# Patient Record
Sex: Male | Born: 1955
Health system: Southern US, Community
[De-identification: ages and names within clinical notes are randomized; demographics above are authoritative.]

## PROBLEM LIST (undated history)

## (undated) DIAGNOSIS — F419 Anxiety disorder, unspecified: Secondary | ICD-10-CM

## (undated) DIAGNOSIS — T8859XA Other complications of anesthesia, initial encounter: Secondary | ICD-10-CM

## (undated) DIAGNOSIS — R011 Cardiac murmur, unspecified: Secondary | ICD-10-CM

## (undated) DIAGNOSIS — Z87442 Personal history of urinary calculi: Secondary | ICD-10-CM

## (undated) DIAGNOSIS — M545 Low back pain, unspecified: Secondary | ICD-10-CM

## (undated) DIAGNOSIS — G8929 Other chronic pain: Secondary | ICD-10-CM

## (undated) DIAGNOSIS — R3915 Urgency of urination: Secondary | ICD-10-CM

## (undated) DIAGNOSIS — M1990 Unspecified osteoarthritis, unspecified site: Secondary | ICD-10-CM

## (undated) DIAGNOSIS — J449 Chronic obstructive pulmonary disease, unspecified: Secondary | ICD-10-CM

## (undated) DIAGNOSIS — J45909 Unspecified asthma, uncomplicated: Secondary | ICD-10-CM

## (undated) DIAGNOSIS — R9431 Abnormal electrocardiogram [ECG] [EKG]: Secondary | ICD-10-CM

## (undated) DIAGNOSIS — E039 Hypothyroidism, unspecified: Secondary | ICD-10-CM

## (undated) DIAGNOSIS — R351 Nocturia: Secondary | ICD-10-CM

## (undated) DIAGNOSIS — N210 Calculus in bladder: Secondary | ICD-10-CM

## (undated) DIAGNOSIS — M79605 Pain in left leg: Secondary | ICD-10-CM

## (undated) DIAGNOSIS — R05 Cough: Secondary | ICD-10-CM

## (undated) DIAGNOSIS — Z905 Acquired absence of kidney: Secondary | ICD-10-CM

## (undated) DIAGNOSIS — R058 Other specified cough: Secondary | ICD-10-CM

## (undated) DIAGNOSIS — E782 Mixed hyperlipidemia: Secondary | ICD-10-CM

## (undated) DIAGNOSIS — E559 Vitamin D deficiency, unspecified: Secondary | ICD-10-CM

## (undated) DIAGNOSIS — R0600 Dyspnea, unspecified: Secondary | ICD-10-CM

## (undated) DIAGNOSIS — D494 Neoplasm of unspecified behavior of bladder: Secondary | ICD-10-CM

## (undated) DIAGNOSIS — J410 Simple chronic bronchitis: Secondary | ICD-10-CM

## (undated) DIAGNOSIS — J189 Pneumonia, unspecified organism: Secondary | ICD-10-CM

## (undated) DIAGNOSIS — D649 Anemia, unspecified: Secondary | ICD-10-CM

## (undated) DIAGNOSIS — C679 Malignant neoplasm of bladder, unspecified: Secondary | ICD-10-CM

## (undated) HISTORY — PX: TOOTH EXTRACTION: SUR596

## (undated) HISTORY — DX: Other chronic pain: G89.29

## (undated) HISTORY — DX: Vitamin D deficiency, unspecified: E55.9

## (undated) HISTORY — DX: Abnormal electrocardiogram (ECG) (EKG): R94.31

## (undated) HISTORY — DX: Anxiety disorder, unspecified: F41.9

## (undated) HISTORY — DX: Hypothyroidism, unspecified: E03.9

## (undated) HISTORY — DX: Mixed hyperlipidemia: E78.2

---

## 2002-03-28 ENCOUNTER — Encounter: Payer: Self-pay | Admitting: Emergency Medicine

## 2002-03-28 ENCOUNTER — Emergency Department (HOSPITAL_COMMUNITY): Admission: EM | Admit: 2002-03-28 | Discharge: 2002-03-29 | Payer: Self-pay | Admitting: Emergency Medicine

## 2002-06-27 ENCOUNTER — Emergency Department (HOSPITAL_COMMUNITY): Admission: EM | Admit: 2002-06-27 | Discharge: 2002-06-28 | Payer: Self-pay | Admitting: Emergency Medicine

## 2005-09-20 ENCOUNTER — Ambulatory Visit (HOSPITAL_COMMUNITY): Admission: RE | Admit: 2005-09-20 | Discharge: 2005-09-21 | Payer: Self-pay | Admitting: Neurosurgery

## 2005-09-20 HISTORY — PX: LUMBAR DISC SURGERY: SHX700

## 2009-06-30 ENCOUNTER — Encounter: Admission: RE | Admit: 2009-06-30 | Discharge: 2009-06-30 | Payer: Self-pay | Admitting: Family Medicine

## 2010-09-30 NOTE — Op Note (Signed)
NAME:  Santor, Ondre                 ACCOUNT NO.:  [id number]   MEDICAL RECORD NO.:  [id number]          PATIENT TYPE:  AMB   LOCATION:  SDS                          FACILITY:  [facility]   PHYSICIAN:  Coletta Memos, M.D.     DATE OF BIRTH:  Sep 24, 1955   DATE OF PROCEDURE:  09/20/2005  DATE OF DISCHARGE:                                 OPERATIVE REPORT   PREOPERATIVE DIAGNOSES:  1.  Far lateral herniated disk, L2-3 left.  2.  Foraminal stenosis L3-4 and at L4-5, lumbar radiculopathy, lumbar      spondylosis.   POSTOPERATIVE DIAGNOSES:  1.  Far lateral herniated disk, L2-3 left.  2.  Foraminal stenosis L3-4 and at L4-5, lumbar radiculopathy, lumbar      spondylosis.   PROCEDURES:  1.  Far lateral diskectomy with microdissection and left L2-3.  2.  Foraminotomy to decompress the L3 nerve roots, L4 nerve roots and L5      nerve roots with microdissection.   COMPLICATIONS:  None.   SURGEON:  Coletta Memos, M.D.   ASSISTANT:  Danae Orleans. Venetia Maxon, M.D.   ANESTHESIA:  General endotracheal.   INDICATIONS:  Mr. Rideaux is a gentleman who has weakness in his left lower  extremity.  He had a herniated disk at L2-3 on the left side in a far  lateral position.  He also has lateral recess stenosis at L3-4 and L4-5.  I  recommended after he stated he could not continue as he was, that he undergo  operative decompression.  He is admitted today for the decompression and  diskectomy.   OPERATIVE NOTE:  Mr. Freida Busman was brought to the operating room intubated,  placed under general anesthetic without difficulty.  He was rolled prone  onto a Wilson frame and all pressure points were properly padded.  His back  was prepped and he was draped in a sterile fashion.  I infiltrated 30 mL  0.5% lidocaine 1:200,000 epinephrine into the lumbar region.  I opened the  skin with #10 blade and took this down to the thoracolumbar fascia.  I then  exposed the lamina of L2 and L3, placed a double ended ganglion knife and  was able identify the L2 lamina.  I then exposed the L4 lamina.  I then also  exposed the L1 lamina so that I could reach the pars interarticularis of L2.  I did this using monopolar cautery and a subperiosteal technique.  I then  drilled out the lateral fifth of the L2 pars in order to remove the  ligamentum flavum.  I did this with a Kerrison punch and microdissection.  I  then was able to identified the left L2 nerve root and the left L2 pedicle.  I retracted the nerve root superiorly and was able to identify the disk  space.  I coagulated the disk space and then opened that with a #15 blade.  I was able to then removed disk material from the lateral portion of the  space and using a blunt ended probe I was then able to remove what was a  large  fragment of disk.  This coincided very nicely with the MRI imaging.  I  then irrigated the wound.  I then placed some Gelfoam in a patty over that  area and then turned my attention to L3-4.  I removed the retractors down  two spaces to the L3-4 interlaminar space.  I then performed a  semihemilaminectomy of L3 using a high-speed drill.  With Dr. Fredrich Birks  assistance and microdissection we removed the ligamentum flavum.  We then  identified the L4 nerve root.  I was also able to palpate and discern the L3  foramen.  Using Kerrison punches I then removed ligamentum flavum and some  overlying bone in the neural foramen of L3 on the left side and also from  inside out overlying L4.  After inspecting that and feeling that there was  very good decompression, I irrigated.  I then moved down again, one  interlaminar space L4-5.  Again using a high-speed drill.  I performed a  semihemilaminectomy of L4.  I used Kerrison punches to remove bone and then  ligamentum flavum.  The thecal sac was exposed.  I then performed  foraminotomy overlying the L4 root.  Also inspected the neural foramen of L4  and used Kerrison punch to remove some overlying ligament.  I  then irrigated  the wound.  I inspected again using microdissection and felt that there was  very good decompression of the L4 and L5 roots.  I then irrigated the wound.  I then closed the wound in a layered fashion after removing all foreign  bodies, specifically Gelfoam patties and the cottonoids.  The wound was  closed in layered fashion using Vicryl sutures to reapproximate the  thoracolumbar fascia, subcutaneous tissues and subcuticular layer.  Dermabond was used for sterile dressing.  Mr. Lindell tolerated the procedure  well.           ______________________________  Coletta Memos, M.D.     KC/MEDQ  D:  09/20/2005  T:  09/21/2005  Job:  [id number]

## 2012-11-30 ENCOUNTER — Emergency Department (HOSPITAL_COMMUNITY): Payer: 59

## 2012-11-30 ENCOUNTER — Encounter (HOSPITAL_COMMUNITY): Payer: Self-pay

## 2012-11-30 ENCOUNTER — Emergency Department (HOSPITAL_COMMUNITY)
Admission: EM | Admit: 2012-11-30 | Discharge: 2012-11-30 | Disposition: A | Discharge status: Home / Self Care | Payer: 59 | Attending: Emergency Medicine | Admitting: Emergency Medicine

## 2012-11-30 DIAGNOSIS — M79609 Pain in unspecified limb: Secondary | ICD-10-CM | POA: Insufficient documentation

## 2012-11-30 DIAGNOSIS — Z8551 Personal history of malignant neoplasm of bladder: Secondary | ICD-10-CM | POA: Insufficient documentation

## 2012-11-30 DIAGNOSIS — Z9889 Other specified postprocedural states: Secondary | ICD-10-CM | POA: Insufficient documentation

## 2012-11-30 DIAGNOSIS — F17200 Nicotine dependence, unspecified, uncomplicated: Secondary | ICD-10-CM | POA: Insufficient documentation

## 2012-11-30 DIAGNOSIS — M5416 Radiculopathy, lumbar region: Secondary | ICD-10-CM

## 2012-11-30 DIAGNOSIS — IMO0002 Reserved for concepts with insufficient information to code with codable children: Secondary | ICD-10-CM | POA: Insufficient documentation

## 2012-11-30 DIAGNOSIS — K5900 Constipation, unspecified: Secondary | ICD-10-CM | POA: Insufficient documentation

## 2012-11-30 MED ORDER — HYDROMORPHONE HCL PF 1 MG/ML IJ SOLN
1.0000 mg | Freq: Once | INTRAMUSCULAR | Status: AC
  Administered 2012-11-30: 1 mg via INTRAVENOUS
  Filled 2012-11-30: qty 1

## 2012-11-30 MED ORDER — CYCLOBENZAPRINE HCL 10 MG PO TABS: 10.0000 mg | ORAL_TABLET | Freq: Two times a day (BID) | ORAL | Status: DC | PRN

## 2012-11-30 MED ORDER — ONDANSETRON HCL 4 MG/2ML IJ SOLN
4.0000 mg | Freq: Once | INTRAMUSCULAR | Status: AC
  Administered 2012-11-30: 4 mg via INTRAVENOUS
  Filled 2012-11-30: qty 2

## 2012-11-30 MED ORDER — HYDROMORPHONE HCL 2 MG PO TABS: 2.0000 mg | ORAL_TABLET | ORAL | Status: DC | PRN

## 2012-11-30 MED ORDER — METHYLPREDNISOLONE SODIUM SUCC 125 MG IJ SOLR
125.0000 mg | Freq: Once | INTRAMUSCULAR | Status: AC
  Administered 2012-11-30: 125 mg via INTRAVENOUS
  Filled 2012-11-30: qty 2

## 2012-11-30 MED ORDER — KETOROLAC TROMETHAMINE 30 MG/ML IJ SOLN
15.0000 mg | Freq: Once | INTRAMUSCULAR | Status: AC
  Administered 2012-11-30: 15 mg via INTRAVENOUS
  Filled 2012-11-30: qty 1

## 2012-11-30 NOTE — ED Notes (Signed)
Family at bedside. 

## 2012-11-30 NOTE — ED Notes (Signed)
EMT attempted to ambulate patient and reports that the patient had difficulty with ambulation d/t pain. Able to walk. Needing to use bed, table, wall to assist with ambulation. Dr. Nicanor Alcon notified. Additional meds ordered.

## 2012-11-30 NOTE — ED Notes (Signed)
Got patient up to walk again walking.. Was better able to ambulate this time, still C/O pain wile

## 2012-11-30 NOTE — ED Notes (Signed)
MD at bedside. 

## 2012-11-30 NOTE — ED Notes (Signed)
Patient presents with c/o lower back pain x 3 days. Was given Rx for prednisone and tramadol which has been helpful. However; patient states pain has gotten worse since this AM. Patient able to ambulate but with difficultly d/t pain. At rest, patient rates pain 6/10 and 10/10 with movement. No bowel or bladder incontinence. No numbness or tingling to BLE. Hx back surgery in 2007.  States that he's been doing "pretty good since then."

## 2012-11-30 NOTE — ED Notes (Signed)
Patient having a hard walking due to pain

## 2012-11-30 NOTE — ED Notes (Signed)
Patient from home via [facility] with c/o lower back pain x 3 days.  Has hx ruptured disc, back surgery & bladder CA. Saw his PCP for this; received Rx tramadol and prednisone with no relief in pain. Ambulatory but with a lot of pain.

## 2012-11-30 NOTE — ED Notes (Signed)
Pt in xray. Will administer medications upon his return

## 2012-11-30 NOTE — ED Provider Notes (Addendum)
History    CSN: [id number] Arrival date & time 11/30/12  1526  First MD Initiated Contact with Patient 11/30/12 1526     Chief Complaint  Patient presents with  . Back Pain   (Consider location/radiation/quality/duration/timing/severity/associated sxs/prior Treatment) Patient is a 57 y.o. male presenting with back pain. The history is provided by the patient.  Back Pain Location:  Lumbar spine Quality:  Aching, stabbing and shooting Radiates to:  L thigh and L knee Pain severity:  Severe Pain is:  Same all the time Onset quality:  Gradual Duration:  4 days Timing:  Constant Progression:  Worsening Chronicity:  Recurrent Context: lifting heavy objects and twisting   Relieved by:  Nothing Worsened by:  Bending and movement Ineffective treatments:  Lying down (tramadol) Associated symptoms: leg pain   Associated symptoms: no abdominal pain, no bladder incontinence, no bowel incontinence, no chest pain, no dysuria, no fever, no numbness, no paresthesias, no perianal numbness and no weakness   Risk factors: no lack of exercise and no recent surgery   Risk factors comment:  Prior hx of back surgery in 2007  Past Medical History  Diagnosis Date  . Back pain   . Bladder cancer    Past Surgical History  Procedure Laterality Date  . Back surgery     No family history on file. History  Substance Use Topics  . Smoking status: Current Every Day Smoker -- 1.00 packs/day    Types: Cigarettes  . Smokeless tobacco: Never Used  . Alcohol Use: No    Review of Systems  Constitutional: Negative for fever and diaphoresis.  Respiratory: Negative for cough and shortness of breath.   Cardiovascular: Negative for chest pain.  Gastrointestinal: Positive for constipation. Negative for nausea, vomiting, abdominal pain, diarrhea and bowel incontinence.       Since been on pain meds he has been constipated  Genitourinary: Negative for bladder incontinence and dysuria.  Musculoskeletal:  Positive for back pain.  Neurological: Negative for weakness, numbness and paresthesias.  All other systems reviewed and are negative.    Allergies  Sulfa antibiotics  Home Medications   Current Outpatient Rx  Name  Route  Sig  Dispense  Refill  . ibuprofen (ADVIL,MOTRIN) 200 MG tablet   Oral   Take 400 mg by mouth every 6 (six) hours as needed for pain.         . predniSONE (DELTASONE) 20 MG tablet   Oral   Take 20-40 mg by mouth daily. Take 2 tablets for 5 days, then take 1 tablet for 5 days. First dose 11/26/12         . traMADol (ULTRAM) 50 MG tablet   Oral   Take 50 mg by mouth every 6 (six) hours as needed for pain.          BP 135/74  Pulse 70  Temp(Src) 97.9 F (36.6 C) (Oral)  Resp 16  SpO2 96% Physical Exam  Nursing note and vitals reviewed. Constitutional: He is oriented to person, place, and time. He appears well-developed and well-nourished. He appears distressed.  HENT:  Head: Normocephalic and atraumatic.  Mouth/Throat: Oropharynx is clear and moist.  Eyes: EOM are normal. Pupils are equal, round, and reactive to light.  Neck: Normal range of motion. Neck supple. No spinous process tenderness and no muscular tenderness present. No rigidity. Normal range of motion present.  Cardiovascular: Normal rate, regular rhythm, normal heart sounds and intact distal pulses.   No murmur heard. Pulmonary/Chest: Effort normal  and breath sounds normal. He has no wheezes. He has no rales.  Abdominal: Soft. He exhibits no distension. There is no tenderness. There is no CVA tenderness.  Musculoskeletal: He exhibits no tenderness.       Lumbar back: He exhibits decreased range of motion, tenderness and pain. He exhibits no swelling, no deformity and normal pulse.       Back:  Neurological: He is alert and oriented to person, place, and time. He has normal strength. No sensory deficit. Coordination normal.  Reflex Scores:      Patellar reflexes are 1+ on the right  side and 1+ on the left side. Skin: Skin is warm and dry. No rash noted.  Psychiatric: He has a normal mood and affect.    ED Course  Procedures (including critical care time) Labs Reviewed - No data to display Dg Hip Complete Left  11/30/2012   *RADIOLOGY REPORT*  Clinical Data: Left hip pain  LEFT HIP - COMPLETE 2+ VIEW  Comparison: Coronal reconstructed imaged from a CT, 06/30/2009  Findings: No fracture.  No bone lesion.  The hip joints are normally spaced and aligned as are the SI joints and symphysis pubis.  The soft tissues are unremarkable.  IMPRESSION: No fracture or joint abnormality.   Original Report Authenticated By: Amie Portland, M.D.   1. Lumbar radiculopathy, acute     MDM   Pt with gradual onset of back pain suggestive of radiculopathy.  No neurovascular compromise and no incontinence.  Pt has no infectious sx, hx of CA  or other red flags concerning for pathologic back pain.  Prior hx of back surgery in 2007 and had been doing well till about 2 weeks ago and now having severe pain down the left leg and unable to walk due to pain.  Normal strength and reflexes on exam.  Denies trauma. Will give pt pain control and to return for developement of above sx. Will give referral to Dr. Franky Macho who did his prior surgery.  6:03 PM Pt still having pain and difficulty walking.  Severe pain when attempting to stand.  Will give 3rd dose of meds and get hip film to further eval.  7:54 PM Patient now able to ambulate and pain is more controlled. We'll give pain and spasm control. Patient will get a cane to help with ambulation he call neurosurgery on Monday  Gwyneth Sprout, MD 11/30/12 1806  Gwyneth Sprout, MD 11/30/12 1955  Gwyneth Sprout, MD 11/30/12 1610

## 2012-12-02 ENCOUNTER — Ambulatory Visit
Admission: RE | Admit: 2012-12-02 | Discharge: 2012-12-02 | Disposition: A | Discharge status: Home / Self Care | Payer: 59 | Source: Ambulatory Visit | Attending: Family Medicine | Admitting: Family Medicine

## 2012-12-02 ENCOUNTER — Other Ambulatory Visit: Payer: Self-pay | Admitting: Family Medicine

## 2012-12-02 DIAGNOSIS — R29898 Other symptoms and signs involving the musculoskeletal system: Secondary | ICD-10-CM

## 2012-12-02 DIAGNOSIS — M549 Dorsalgia, unspecified: Secondary | ICD-10-CM

## 2012-12-11 ENCOUNTER — Other Ambulatory Visit: Payer: Self-pay | Admitting: Urology

## 2012-12-11 ENCOUNTER — Encounter (HOSPITAL_BASED_OUTPATIENT_CLINIC_OR_DEPARTMENT_OTHER): Payer: Self-pay | Admitting: *Deleted

## 2012-12-11 ENCOUNTER — Other Ambulatory Visit (HOSPITAL_COMMUNITY): Payer: Self-pay | Admitting: Neurosurgery

## 2012-12-11 DIAGNOSIS — M5416 Radiculopathy, lumbar region: Secondary | ICD-10-CM

## 2012-12-12 ENCOUNTER — Encounter (HOSPITAL_BASED_OUTPATIENT_CLINIC_OR_DEPARTMENT_OTHER): Payer: Self-pay | Admitting: *Deleted

## 2012-12-12 NOTE — H&P (Signed)
Reason For Visit  Harry Young is a 57 year old male former patient of Dr. Edwin Cap seen today for followup of bladder cancer   History of Present Illness        Transitional cell carcinoma of the bladder: Presenting symptom - gross hematuria. CT scan 06/20/09 - 4 cm bladder tumor on the posterior right wall with no evidence of adenopathy or hydronephrosis. TURBT 07/05/09 - tumor fully resected including partial resection of right ureteral orifice.  Pathology: TCCA noninvasive, moderate grade (Ta,G2/3) BCG - 6 week induction course, full strength completed 5/11 Second TURBT 11/22/09 - posterior wall lesion and a second lesion at the anterior bladder neck region.  Pathology: Ta,G2/3 Second BCG - 6 week induction course completed 8/11  Cystoscopy and cytology have been negative since then.  Interval history: He has been doing well. Does not report any new changes in his voiding habits. He said a couple of months ago he may have seen some blood in his urine although is not sure. He said over the past month he has not seen any blood. The last time he underwent surveillance cystoscopy was over a year ago. He also reports he has not had a DRE or PSA in over a year as well.   Past Medical History Problems  1. History of  Asthma 493.90 2. History of  Cellulitis 682.9 3. History of  Chronic Bronchitis 491.9 4. History of  Nephrolithiasis V13.01 5. Transitional Cell Carcinoma Of The Bladder 188.9  Surgical History Problems  1. History of  Back Surgery 2. History of  Cystoscopy Bladder Tumor 596.9 3. History of  Cystoscopy Bladder Tumor 596.9 4. History of  Spinal Diskectomy  Current Meds 1. Calcium + D TABS; Therapy: (Recorded:29Jul2014) to 2. Multiple Vitamin TABS; Therapy: (Recorded:29Jul2014) to 3. Omega-3 Fish Oil CAPS; Therapy: (Recorded:29Jul2014) to 4. Uribel 118 MG Oral Capsule; Therapy: (Recorded:29Jul2014) to  Allergies Medication  1. Septra TABS  Family History Problems  1.  Family history of  Congestive Heart Failure 2. Family history of  Death In The Family Father  Social History Problems    Caffeine Use   Marital History - Currently Married   Tobacco Use 305.1 Denied    History of  Alcohol Use  Review of Systems Genitourinary, constitutional, skin, eye, otolaryngeal, hematologic/lymphatic, cardiovascular, pulmonary, endocrine, musculoskeletal, gastrointestinal, neurological and psychiatric system(s) were reviewed and pertinent findings if present are noted.  Genitourinary: difficulty starting the urinary stream, post-void dribbling and hematuria.  Gastrointestinal: constipation.  ENT: sinus problems.  Respiratory: cough.  Musculoskeletal: back pain and joint pain.    Vitals Vital Signs [Data Includes: Last 1 Day]  29Jul2014 11:16AM  Blood Pressure: 118 / 66 Heart Rate: 75 29Jul2014 11:15AM  BMI Calculated: 23.91 BSA Calculated: 1.93 Height: 5 ft 10 in Weight: 167 lb  29Jul2014 11:14AM  Temperature: 97.5 F 29Jul2014 09:52AM  BMI Calculated: 25.31 BSA Calculated: 1.9 Height: 5 ft 8 in Weight: 167 lb   Physical Exam Constitutional: Well nourished and well developed . No acute distress.  ENT:. The ears and nose are normal in appearance.  Neck: The appearance of the neck is normal and no neck mass is present.  Pulmonary: No respiratory distress and normal respiratory rhythm and effort.  Cardiovascular: Heart rate and rhythm are normal . No peripheral edema.  Abdomen: The abdomen is soft and nontender. No masses are palpated. No CVA tenderness. No hernias are palpable. No hepatosplenomegaly noted.  Rectal: Rectal exam demonstrates normal sphincter tone, no tenderness and no masses. The  prostate has no nodularity and is not tender. The left seminal vesicle is nonpalpable. The right seminal vesicle is nonpalpable. The perineum is normal on inspection.  Genitourinary: Examination of the penis demonstrates no discharge, no masses, no lesions and  a normal meatus. The penis is uncircumcised. The scrotum is without lesions. The right epididymis is palpably normal and non-tender. The left epididymis is palpably normal and non-tender. The right testis is non-tender and without masses. The left testis is non-tender and without masses.  Lymphatics: The femoral and inguinal nodes are not enlarged or tender.  Skin: Normal skin turgor, no visible rash and no visible skin lesions.  Neuro/Psych:. Mood and affect are appropriate.    Results/Data Urine [Data Includes: Last 1 Day]   29Jul2014  COLOR YELLOW   APPEARANCE CLEAR   SPECIFIC GRAVITY 1.025   pH 6.0   GLUCOSE NEG mg/dL  BILIRUBIN NEG   KETONE NEG mg/dL  BLOOD MOD   PROTEIN NEG mg/dL  UROBILINOGEN 0.2 mg/dL  NITRITE NEG   LEUKOCYTE ESTERASE NEG   SQUAMOUS EPITHELIAL/HPF RARE   WBC 0-2 WBC/hpf  RBC 3-6 RBC/hpf  BACTERIA RARE   CRYSTALS NONE SEEN   CASTS NONE SEEN    Old records or history reviewed: Notes from Dr. Hurley Cisco office as above.  The following clinical lab reports were reviewed:  UA: 3 - 6 RBC/hpf.    Procedure  Procedure: Cystoscopy   Indication: Hematuria. History of Urothelial Carcinoma.  Informed Consent: Risks, benefits, and potential adverse events were discussed and informed consent was obtained from the patient.  Prep: The patient was prepped with betadine.  Procedure Note:  Urethral meatus:. No abnormalities.  Anterior urethra: No abnormalities.  Prostatic urethra: No abnormalities.  Bladder: Visulization was clear. The ureteral orifices were in the normal anatomic position bilaterally and had clear efflux of urine. A solitary tumor was visualized in the bladder. A papillary tumor was seen in the bladder. This tumor was located on the left side, at the base of the bladder. The patient tolerated the procedure well.  Complications: None.    Assessment Assessed  1. Transitional Cell Carcinoma Of The Bladder 188.9 2. Visit For: Screening Exam Malignant  Neoplasm Prostate V76.44   He has an obvious papillary transitional cell carcinoma seen on the floor of his bladder just posterior to his left ureteral orifice. We discussed the need for transurethral resection of the tumor. I've gone over the procedure with him in detail including its risks and complications and the alternatives. We discussed the probability of success and the outpatient nature of the procedure as well as anticipated postoperative course. In addition I am going to image his upper tract with bilateral retrograde pyelograms at that time. We also discussed intravesical mitomycin-C postoperatively. I'm going to obtain a screening PSA today as well.   Plan  Health Maintenance (V70.0)  1. UA With REFLEX  Done: 29Jul2014 11:02AM Transitional Cell Carcinoma Of The Bladder (188.9)  2. Cysto   1. He will be scheduled for TURBT. 2. I will plan to instill postoperative mitomycin-C intravesically. 3. Obtain PSA today.

## 2012-12-12 NOTE — Progress Notes (Signed)
NPO AFTER MN. ARRIVES AT 0830. NEEDS HG. WILL TAKE OXYCODONE AM OF SURG W/ SIP OF WATER.

## 2012-12-13 ENCOUNTER — Encounter (HOSPITAL_BASED_OUTPATIENT_CLINIC_OR_DEPARTMENT_OTHER): Payer: Self-pay | Admitting: Anesthesiology

## 2012-12-13 ENCOUNTER — Ambulatory Visit (HOSPITAL_BASED_OUTPATIENT_CLINIC_OR_DEPARTMENT_OTHER): Payer: 59 | Admitting: Anesthesiology

## 2012-12-13 ENCOUNTER — Ambulatory Visit (HOSPITAL_COMMUNITY): Payer: 59

## 2012-12-13 ENCOUNTER — Ambulatory Visit (HOSPITAL_BASED_OUTPATIENT_CLINIC_OR_DEPARTMENT_OTHER)
Admission: RE | Admit: 2012-12-13 | Discharge: 2012-12-13 | Disposition: A | Discharge status: Home / Self Care | Payer: 59 | Source: Ambulatory Visit | Attending: Urology | Admitting: Urology

## 2012-12-13 ENCOUNTER — Encounter (HOSPITAL_BASED_OUTPATIENT_CLINIC_OR_DEPARTMENT_OTHER)
Admission: RE | Disposition: A | Discharge status: Home / Self Care | Payer: Self-pay | Source: Ambulatory Visit | Attending: Urology

## 2012-12-13 DIAGNOSIS — J449 Chronic obstructive pulmonary disease, unspecified: Secondary | ICD-10-CM | POA: Insufficient documentation

## 2012-12-13 DIAGNOSIS — Z882 Allergy status to sulfonamides status: Secondary | ICD-10-CM | POA: Insufficient documentation

## 2012-12-13 DIAGNOSIS — J4489 Other specified chronic obstructive pulmonary disease: Secondary | ICD-10-CM | POA: Insufficient documentation

## 2012-12-13 DIAGNOSIS — C679 Malignant neoplasm of bladder, unspecified: Secondary | ICD-10-CM | POA: Insufficient documentation

## 2012-12-13 DIAGNOSIS — F17200 Nicotine dependence, unspecified, uncomplicated: Secondary | ICD-10-CM | POA: Insufficient documentation

## 2012-12-13 DIAGNOSIS — D494 Neoplasm of unspecified behavior of bladder: Secondary | ICD-10-CM

## 2012-12-13 HISTORY — DX: Low back pain, unspecified: M54.50

## 2012-12-13 HISTORY — DX: Pain in left leg: M79.605

## 2012-12-13 HISTORY — DX: Neoplasm of unspecified behavior of bladder: D49.4

## 2012-12-13 HISTORY — PX: CYSTOSCOPY W/ RETROGRADES: SHX1426

## 2012-12-13 HISTORY — DX: Simple chronic bronchitis: J41.0

## 2012-12-13 HISTORY — DX: Other specified cough: R05.8

## 2012-12-13 HISTORY — DX: Cough: R05

## 2012-12-13 HISTORY — PX: TRANSURETHRAL RESECTION OF BLADDER TUMOR: SHX2575

## 2012-12-13 HISTORY — DX: Low back pain: M54.5

## 2012-12-13 LAB — POCT HEMOGLOBIN-HEMACUE: Hemoglobin: 15.7 g/dL (ref 13.0–17.0)

## 2012-12-13 SURGERY — TURBT (TRANSURETHRAL RESECTION OF BLADDER TUMOR)
Anesthesia: General | Site: Ureter | Wound class: Clean Contaminated

## 2012-12-13 MED ORDER — CIPROFLOXACIN IN D5W 400 MG/200ML IV SOLN
400.0000 mg | INTRAVENOUS | Status: AC
  Administered 2012-12-13: 400 mg via INTRAVENOUS
  Filled 2012-12-13: qty 200

## 2012-12-13 MED ORDER — LACTATED RINGERS IV SOLN
INTRAVENOUS | Status: DC
  Administered 2012-12-13: 11:00:00 via INTRAVENOUS
  Filled 2012-12-13: qty 1000

## 2012-12-13 MED ORDER — FENTANYL CITRATE 0.05 MG/ML IJ SOLN
INTRAMUSCULAR | Status: DC | PRN
  Administered 2012-12-13 (×2): 12.5 ug via INTRAVENOUS
  Administered 2012-12-13: 25 ug via INTRAVENOUS
  Administered 2012-12-13 (×3): 12.5 ug via INTRAVENOUS
  Administered 2012-12-13: 50 ug via INTRAVENOUS
  Administered 2012-12-13: 12.5 ug via INTRAVENOUS

## 2012-12-13 MED ORDER — LACTATED RINGERS IV SOLN
INTRAVENOUS | Status: DC
  Filled 2012-12-13: qty 1000

## 2012-12-13 MED ORDER — KETOROLAC TROMETHAMINE 30 MG/ML IJ SOLN
INTRAMUSCULAR | Status: DC | PRN
  Administered 2012-12-13: 30 mg via INTRAVENOUS

## 2012-12-13 MED ORDER — PROPOFOL 10 MG/ML IV BOLUS
INTRAVENOUS | Status: DC | PRN
  Administered 2012-12-13: 200 mg via INTRAVENOUS

## 2012-12-13 MED ORDER — PROMETHAZINE HCL 25 MG/ML IJ SOLN
6.2500 mg | INTRAMUSCULAR | Status: DC | PRN
  Filled 2012-12-13: qty 1

## 2012-12-13 MED ORDER — PHENAZOPYRIDINE HCL 200 MG PO TABS
200.0000 mg | ORAL_TABLET | Freq: Once | ORAL | Status: DC
  Filled 2012-12-13: qty 1

## 2012-12-13 MED ORDER — ONDANSETRON HCL 4 MG/2ML IJ SOLN
INTRAMUSCULAR | Status: DC | PRN
  Administered 2012-12-13: 4 mg via INTRAVENOUS

## 2012-12-13 MED ORDER — FENTANYL CITRATE 0.05 MG/ML IJ SOLN
25.0000 ug | INTRAMUSCULAR | Status: DC | PRN
  Filled 2012-12-13: qty 1

## 2012-12-13 MED ORDER — MITOMYCIN CHEMO FOR BLADDER INSTILLATION 40 MG
40.0000 mg | Freq: Once | INTRAVENOUS | Status: DC
  Filled 2012-12-13: qty 40

## 2012-12-13 MED ORDER — MIDAZOLAM HCL 5 MG/5ML IJ SOLN
INTRAMUSCULAR | Status: DC | PRN
  Administered 2012-12-13 (×4): 1 mg via INTRAVENOUS

## 2012-12-13 MED ORDER — HYDROCODONE-ACETAMINOPHEN 10-325 MG PO TABS: 1.0000 | ORAL_TABLET | Freq: Four times a day (QID) | ORAL | Status: DC | PRN

## 2012-12-13 MED ORDER — PHENAZOPYRIDINE HCL 200 MG PO TABS: 200.0000 mg | ORAL_TABLET | Freq: Three times a day (TID) | ORAL | Status: DC | PRN

## 2012-12-13 MED ORDER — IOHEXOL 350 MG/ML SOLN
INTRAVENOUS | Status: DC | PRN
  Administered 2012-12-13: 18 mL via URETHRAL

## 2012-12-13 MED ORDER — DEXAMETHASONE SODIUM PHOSPHATE 4 MG/ML IJ SOLN
INTRAMUSCULAR | Status: DC | PRN
  Administered 2012-12-13: 10 mg via INTRAVENOUS

## 2012-12-13 MED ORDER — SODIUM CHLORIDE 0.9 % IR SOLN
Status: DC | PRN
  Administered 2012-12-13: 9000 mL via INTRAVESICAL

## 2012-12-13 MED ORDER — LACTATED RINGERS IV SOLN
INTRAVENOUS | Status: DC | PRN
  Administered 2012-12-13 (×2): via INTRAVENOUS

## 2012-12-13 MED ORDER — STERILE WATER FOR IRRIGATION IR SOLN
Status: DC | PRN
  Administered 2012-12-13: 10 mL

## 2012-12-13 MED ORDER — LIDOCAINE HCL (CARDIAC) 20 MG/ML IV SOLN
INTRAVENOUS | Status: DC | PRN
  Administered 2012-12-13: 100 mg via INTRAVENOUS

## 2012-12-13 SURGICAL SUPPLY — 58 items
ADAPTER CATH URET PLST 4-6FR (CATHETERS) IMPLANT
ADPR CATH URET STRL DISP 4-6FR (CATHETERS)
BAG DRAIN URO-CYSTO SKYTR STRL (DRAIN) ×3 IMPLANT
BAG DRN ANRFLXCHMBR STRAP LEK (BAG)
BAG DRN UROCATH (DRAIN) ×2
BAG URINE DRAINAGE (UROLOGICAL SUPPLIES) IMPLANT
BAG URINE LEG 19OZ MD ST LTX (BAG) IMPLANT
BASKET LASER NITINOL 1.9FR (BASKET) IMPLANT
BASKET STNLS GEMINI 4WIRE 3FR (BASKET) IMPLANT
BASKET ZERO TIP NITINOL 2.4FR (BASKET) IMPLANT
BRUSH URET BIOPSY 3F (UROLOGICAL SUPPLIES) IMPLANT
BSKT STON RTRVL 120 1.9FR (BASKET)
BSKT STON RTRVL GEM 120X11 3FR (BASKET)
BSKT STON RTRVL ZERO TP 2.4FR (BASKET)
CANISTER SUCT LVC 12 LTR MEDI- (MISCELLANEOUS) ×2 IMPLANT
CATH FOLEY 2WAY SLVR  5CC 18FR (CATHETERS) ×1
CATH FOLEY 2WAY SLVR  5CC 20FR (CATHETERS)
CATH FOLEY 2WAY SLVR  5CC 22FR (CATHETERS)
CATH FOLEY 2WAY SLVR  5CC 24FR (CATHETERS)
CATH FOLEY 2WAY SLVR 5CC 18FR (CATHETERS) IMPLANT
CATH FOLEY 2WAY SLVR 5CC 20FR (CATHETERS) IMPLANT
CATH FOLEY 2WAY SLVR 5CC 22FR (CATHETERS) IMPLANT
CATH FOLEY 2WAY SLVR 5CC 24FR (CATHETERS) ×2 IMPLANT
CATH INTERMIT  6FR 70CM (CATHETERS) ×1 IMPLANT
CATH URET 5FR 28IN CONE TIP (BALLOONS)
CATH URET 5FR 70CM CONE TIP (BALLOONS) IMPLANT
CLOTH BEACON ORANGE TIMEOUT ST (SAFETY) ×3 IMPLANT
DRAPE CAMERA CLOSED 9X96 (DRAPES) ×3 IMPLANT
ELECT BUTTON HF 24-28F 2 30DE (ELECTRODE) IMPLANT
ELECT LOOP MED HF 24F 12D (CUTTING LOOP) ×1 IMPLANT
ELECT LOOP MED HF 24F 12D CBL (CLIP) ×1 IMPLANT
ELECT REM PT RETURN 9FT ADLT (ELECTROSURGICAL)
ELECT RESECT VAPORIZE 12D CBL (ELECTRODE) ×2 IMPLANT
ELECTRODE REM PT RTRN 9FT ADLT (ELECTROSURGICAL) ×2 IMPLANT
EVACUATOR MICROVAS BLADDER (UROLOGICAL SUPPLIES) ×1 IMPLANT
GLOVE BIO SURGEON STRL SZ8 (GLOVE) ×3 IMPLANT
GLOVE ECLIPSE STER SZ 5.5 (GLOVE) ×1 IMPLANT
GLOVE INDICATOR 6.5 STRL GRN (GLOVE) ×1 IMPLANT
GOWN PREVENTION PLUS LG XLONG (DISPOSABLE) ×5 IMPLANT
GOWN STRL REIN XL XLG (GOWN DISPOSABLE) ×3 IMPLANT
GUIDEWIRE 0.038 PTFE COATED (WIRE) IMPLANT
GUIDEWIRE ANG ZIPWIRE 038X150 (WIRE) IMPLANT
GUIDEWIRE STR DUAL SENSOR (WIRE) ×3 IMPLANT
HOLDER FOLEY CATH W/STRAP (MISCELLANEOUS) ×1 IMPLANT
IV NS IRRIG 3000ML ARTHROMATIC (IV SOLUTION) ×3 IMPLANT
KIT ASPIRATION TUBING (SET/KITS/TRAYS/PACK) ×1 IMPLANT
KIT BALLIN UROMAX 15FX10 (LABEL) IMPLANT
KIT BALLN UROMAX 15FX4 (MISCELLANEOUS) IMPLANT
KIT BALLN UROMAX 26 75X4 (MISCELLANEOUS)
PACK CYSTOSCOPY (CUSTOM PROCEDURE TRAY) ×3 IMPLANT
PLUG CATH AND CAP STER (CATHETERS) ×1 IMPLANT
SET ASPIRATION TUBING (TUBING) IMPLANT
SET HIGH PRES BAL DIL (LABEL)
SHEATH URET ACCESS 12FR/35CM (UROLOGICAL SUPPLIES) IMPLANT
SHEATH URET ACCESS 12FR/55CM (UROLOGICAL SUPPLIES) IMPLANT
SYRINGE IRR TOOMEY STRL 70CC (SYRINGE) IMPLANT
WATER STERILE IRR 1000ML POUR (IV SOLUTION) ×1 IMPLANT
WATER STERILE IRR 3000ML UROMA (IV SOLUTION) ×1 IMPLANT

## 2012-12-13 NOTE — Anesthesia Procedure Notes (Signed)
Procedure Name: LMA Insertion Date/Time: 12/13/2012 12:09 PM Performed by: Jessica Priest Pre-anesthesia Checklist: Patient identified, Emergency Drugs available, Suction available and Patient being monitored Patient Re-evaluated:Patient Re-evaluated prior to inductionOxygen Delivery Method: Circle System Utilized Preoxygenation: Pre-oxygenation with 100% oxygen Intubation Type: IV induction Ventilation: Mask ventilation without difficulty LMA: LMA inserted LMA Size: 4.0 Number of attempts: 1 Airway Equipment and Method: bite block Placement Confirmation: positive ETCO2 Tube secured with: Tape Dental Injury: Teeth and Oropharynx as per pre-operative assessment

## 2012-12-13 NOTE — Anesthesia Preprocedure Evaluation (Signed)
Anesthesia Evaluation  Patient identified by MRN, date of birth, ID band Patient awake    Reviewed: Allergy & Precautions, H&P , NPO status , Patient's Chart, lab work & pertinent test results  Airway Mallampati: II TM Distance: >3 FB Neck ROM: Full    Dental  (+) Teeth Intact and Dental Advisory Given,    Pulmonary neg pulmonary ROS, Current Smoker,  breath sounds clear to auscultation  Pulmonary exam normal       Cardiovascular negative cardio ROS  Rhythm:Regular Rate:Normal     Neuro/Psych negative neurological ROS  negative psych ROS   GI/Hepatic negative GI ROS, Neg liver ROS,   Endo/Other  negative endocrine ROS  Renal/GU negative Renal ROS  negative genitourinary   Musculoskeletal negative musculoskeletal ROS (+)   Abdominal   Peds  Hematology negative hematology ROS (+)   Anesthesia Other Findings   Reproductive/Obstetrics                           Anesthesia Physical Anesthesia Plan  ASA: II  Anesthesia Plan: General   Post-op Pain Management:    Induction: Intravenous  Airway Management Planned: Oral ETT  Additional Equipment:   Intra-op Plan:   Post-operative Plan: Extubation in OR  Informed Consent: I have reviewed the patients History and Physical, chart, labs and discussed the procedure including the risks, benefits and alternatives for the proposed anesthesia with the patient or authorized representative who has indicated his/her understanding and acceptance.   Dental advisory given  Plan Discussed with: CRNA  Anesthesia Plan Comments:         Anesthesia Quick Evaluation

## 2012-12-13 NOTE — Interval H&P Note (Signed)
History and Physical Interval Note:  12/13/2012 11:51 AM  Harry Young  has presented today for surgery, with the diagnosis of BLADDER TUMOR  The various methods of treatment have been discussed with the patient and family. After consideration of risks, benefits and other options for treatment, the patient has consented to  Procedure(s): TRANSURETHRAL RESECTION OF BLADDER TUMOR (TURBT) (N/A) CYSTOSCOPY WITH RETROGRADE PYELOGRAM (Bilateral) as a surgical intervention .  The patient's history has been reviewed, patient examined, no change in status, stable for surgery.  I have reviewed the patient's chart and labs.  Questions were answered to the patient's satisfaction.     Garnett Farm

## 2012-12-13 NOTE — Transfer of Care (Signed)
Immediate Anesthesia Transfer of Care Note  Patient: Harry Young  Procedure(s) Performed: Procedure(s) (LRB): TRANSURETHRAL RESECTION OF BLADDER TUMOR (TURBT) AND INSTILLATION OF MYTOMYCIN (N/A) CYSTOSCOPY WITH RETROGRADE PYELOGRAM (Bilateral)  Patient Location: PACU  Anesthesia Type: General  Level of Consciousness: awake, sedated, patient cooperative and responds to stimulation  Airway & Oxygen Therapy: Patient Spontanous Breathing and Patient connected to face mask oxygen  Post-op Assessment: Report given to PACU RN, Post -op Vital signs reviewed and stable and Patient moving all extremities  Post vital signs: Reviewed and stable  Complications: No apparent anesthesia complications

## 2012-12-13 NOTE — Op Note (Signed)
PATIENT:  Harry Young  PRE-OPERATIVE DIAGNOSIS: Bladder tumor  POST-OPERATIVE DIAGNOSIS: Same  PROCEDURE:  Procedure(s): 1. TRANSURETHRAL RESECTION OF BLADDER TUMOR (TURBT) (3 cm.) 2. Bilateral retrograde pyelograms with interpretation. 3. Instillation of intravesical chemotherapy (mitomycin-C)  SURGEON:  Surgeon(s): Garnett Farm  ANESTHESIA:   General  EBL:  Minimal  DRAINS: Urinary Catheter (18 Fr. Foley)   SPECIMEN:  Bladder tumor  DISPOSITION OF SPECIMEN:  PATHOLOGY  Indication: Harry Young is a 57 year old male with a history of noninvasive, moderate grade bladder cancer. He underwent his initial bladder tumor resection in 2/11 from the region of the right ureteral orifice and underwent a course of BCG which he completed in 5/11. Recurrent bladder tumors were found and resected in 7/11 and he therefore underwent a second induction course of BCG. He elected to continue his care in [place] rather than [facility] and indicated that it had been about a year since he had his last cystoscopic evaluation. I found a bladder tumor on the floor of the bladder just posterior to the left ureteral orifice. He therefore is brought to the operating room today for resection of bladder tumor, evaluation of his upper tracts with retrograde pyelography and instillation of postoperative intravesical chemotherapy with mitomycin-C.  Description of operation: The patient was taken to the operating room and administered general anesthesia. He was then placed on the table and moved to the dorsal lithotomy position after which his genitalia was sterilely prepped and draped. An official timeout was then performed.  The 26 French resectoscope with Timberlake obturator was then introduced into the bladder and the obturator was removed. The resectoscope element with 12 lens was then inserted and the bladder was fully and systematically inspected. Ureteral orifices were noted to be in the normal anatomic  positions.   I first began by resecting the papillary portion of the tumor. Once I had resected this down to the detrusor I used the Microvasive evacuator to remove all of this portion of the tumor and it was sent to pathology labeled bladder tumor. I then resected more tissue from where the tumor had been located obtaining a portion of the detrusor wall beneath the tumor and sent this separately as base of bladder tumor. I then fulgurated the mucosa circumferentially around the area of resection and then fulgurated any small bleeding points that were noted in the area of the resection as well. Reinspection of the bladder revealed all obvious tumor had been fully resected and there was no evidence of perforation. I then removed the resectoscope.  An 15 French Foley catheter was then inserted in the bladder and irrigated. The irrigant returned slightly pink with no clots. The patient was awakened and taken to the recovery room.  While in the recovery room 40 mg of mitomycin-C in 40 cc of water were instilled in the bladder through the catheter and the catheter was plugged. This will remain indwelling for approximately one hour. It will then be drained from the bladder and the catheter will be removed and the patient discharged home.  PLAN OF CARE: Discharge to home after PACU  PATIENT DISPOSITION:  PACU - hemodynamically stable.

## 2012-12-14 NOTE — Anesthesia Postprocedure Evaluation (Signed)
Anesthesia Post Note  Patient: Harry Young  Procedure(s) Performed: Procedure(s) (LRB): TRANSURETHRAL RESECTION OF BLADDER TUMOR (TURBT) AND INSTILLATION OF MYTOMYCIN (N/A) CYSTOSCOPY WITH RETROGRADE PYELOGRAM (Bilateral)  Anesthesia type: General  Patient location: PACU  Post pain: Pain level controlled  Post assessment: Post-op Vital signs reviewed  Last Vitals:  Filed Vitals:   12/13/12 1530  BP: 149/84  Pulse: 64  Temp: 36.1 C  Resp: 18    Post vital signs: Reviewed  Level of consciousness: sedated  Complications: No apparent anesthesia complications

## 2012-12-16 ENCOUNTER — Other Ambulatory Visit: Payer: Self-pay | Admitting: Neurosurgery

## 2012-12-16 ENCOUNTER — Encounter (HOSPITAL_COMMUNITY): Payer: Self-pay

## 2012-12-16 ENCOUNTER — Encounter (HOSPITAL_BASED_OUTPATIENT_CLINIC_OR_DEPARTMENT_OTHER): Payer: Self-pay | Admitting: Urology

## 2012-12-17 ENCOUNTER — Ambulatory Visit (HOSPITAL_COMMUNITY)
Admission: RE | Admit: 2012-12-17 | Discharge: 2012-12-17 | Disposition: A | Discharge status: Home / Self Care | Payer: 59 | Source: Ambulatory Visit | Attending: Neurosurgery | Admitting: Neurosurgery

## 2012-12-17 VITALS — BP 144/85 | HR 76 | Temp 98.1°F | Resp 18 | Ht 70.0 in | Wt 167.0 lb

## 2012-12-17 DIAGNOSIS — M5416 Radiculopathy, lumbar region: Secondary | ICD-10-CM

## 2012-12-17 DIAGNOSIS — M79609 Pain in unspecified limb: Secondary | ICD-10-CM | POA: Insufficient documentation

## 2012-12-17 DIAGNOSIS — C679 Malignant neoplasm of bladder, unspecified: Secondary | ICD-10-CM | POA: Insufficient documentation

## 2012-12-17 DIAGNOSIS — M48061 Spinal stenosis, lumbar region without neurogenic claudication: Secondary | ICD-10-CM | POA: Insufficient documentation

## 2012-12-17 MED ORDER — ONDANSETRON HCL 4 MG/2ML IJ SOLN: 4.0000 mg | Freq: Four times a day (QID) | INTRAMUSCULAR | Status: DC | PRN

## 2012-12-17 MED ORDER — DIAZEPAM 5 MG PO TABS
ORAL_TABLET | ORAL | Status: AC
  Filled 2012-12-17: qty 1

## 2012-12-17 MED ORDER — OXYCODONE HCL 5 MG PO TABS
ORAL_TABLET | ORAL | Status: AC
  Administered 2012-12-17: 5 mg
  Filled 2012-12-17: qty 1

## 2012-12-17 MED ORDER — MORPHINE SULFATE 4 MG/ML IJ SOLN: 2.0000 mg | INTRAMUSCULAR | Status: DC | PRN

## 2012-12-17 MED ORDER — DIAZEPAM 5 MG PO TABS: 5.0000 mg | ORAL_TABLET | Freq: Once | ORAL | Status: DC

## 2012-12-17 MED ORDER — OXYCODONE HCL 5 MG PO TABS: 5.0000 mg | ORAL_TABLET | ORAL | Status: DC | PRN

## 2012-12-17 MED ORDER — IOHEXOL 180 MG/ML  SOLN
20.0000 mL | Freq: Once | INTRAMUSCULAR | Status: AC | PRN
  Administered 2012-12-17: 20 mL via INTRATHECAL

## 2013-01-01 ENCOUNTER — Other Ambulatory Visit: Payer: Self-pay | Admitting: Neurosurgery

## 2013-01-07 ENCOUNTER — Ambulatory Visit (HOSPITAL_COMMUNITY)
Admission: RE | Admit: 2013-01-07 | Discharge: 2013-01-07 | Disposition: A | Discharge status: Home / Self Care | Payer: 59 | Source: Ambulatory Visit | Attending: Anesthesiology | Admitting: Anesthesiology

## 2013-01-07 ENCOUNTER — Encounter (HOSPITAL_COMMUNITY): Payer: Self-pay

## 2013-01-07 ENCOUNTER — Encounter (HOSPITAL_COMMUNITY)
Admission: RE | Admit: 2013-01-07 | Discharge: 2013-01-07 | Disposition: A | Discharge status: Home / Self Care | Payer: 59 | Source: Ambulatory Visit | Attending: Neurosurgery | Admitting: Neurosurgery

## 2013-01-07 DIAGNOSIS — Z01818 Encounter for other preprocedural examination: Secondary | ICD-10-CM | POA: Insufficient documentation

## 2013-01-07 DIAGNOSIS — Z01812 Encounter for preprocedural laboratory examination: Secondary | ICD-10-CM | POA: Insufficient documentation

## 2013-01-07 HISTORY — DX: Chronic obstructive pulmonary disease, unspecified: J44.9

## 2013-01-07 HISTORY — DX: Unspecified asthma, uncomplicated: J45.909

## 2013-01-07 LAB — BASIC METABOLIC PANEL
BUN: 15 mg/dL (ref 6–23)
CO2: 28 mEq/L (ref 19–32)
Calcium: 9.3 mg/dL (ref 8.4–10.5)
Chloride: 102 mEq/L (ref 96–112)
Creatinine, Ser: 0.92 mg/dL (ref 0.50–1.35)
GFR calc Af Amer: >90 mL/min (ref >90)
GFR calc non Af Amer: >90 mL/min (ref >90)
Glucose, Bld: 107 mg/dL — ABNORMAL HIGH (ref 70–99)
Potassium: 4.3 mEq/L (ref 3.5–5.1)
Sodium: 138 mEq/L (ref 135–145)

## 2013-01-07 LAB — CBC
HCT: 42.2 % (ref 39.0–52.0)
Hemoglobin: 14.5 g/dL (ref 13.0–17.0)
MCH: 32.9 pg (ref 26.0–34.0)
MCHC: 34.4 g/dL (ref 30.0–36.0)
MCV: 95.7 fL (ref 78.0–100.0)
Platelets: 232 10*3/uL (ref 150–400)
RBC: 4.41 MIL/uL (ref 4.22–5.81)
RDW: 13.9 % (ref 11.5–15.5)
WBC: 13.7 10*3/uL — ABNORMAL HIGH (ref 4.0–10.5)

## 2013-01-07 LAB — SURGICAL PCR SCREEN
MRSA, PCR: NEGATIVE
Staphylococcus aureus: NEGATIVE

## 2013-01-07 NOTE — Pre-Procedure Instructions (Signed)
SAIR FAULCON  01/07/2013   Your procedure is scheduled on:  Thursday January 09, 2013  Report to [facility] at 1000 AM.  Call this number if you have problems the morning of surgery: [phone]   Remember:   Do not eat food or drink liquids after midnight.   Take these medicines the morning of surgery with A SIP OF WATER: Hydrocodone-acetaminophen if needed for pain   Do not wear jewelry.  Do not wear lotions, powders, or perfumes. You may wear deodorant.             Men may shave face and neck.  Do not bring valuables to the hospital.  [facility] is not responsible for any belongings or valuables.  Contacts, dentures or bridgework may not be worn into surgery.  Leave suitcase in the car. After surgery it may be brought to your room.  For patients admitted to the hospital, checkout time is 11:00 AM the day of discharge.   Patients discharged the day of surgery will not be allowed to drive home.    Special Instructions: Shower using CHG 2 nights before surgery and the night before surgery.  If you shower the day of surgery use CHG.  Use special wash - you have one bottle of CHG for all showers.  You should use approximately 1/3 of the bottle for each shower.   Please read over the following fact sheets that you were given: Pain Booklet, Coughing and Deep Breathing, MRSA Information and Surgical Site Infection Prevention

## 2013-01-08 MED ORDER — CEFAZOLIN SODIUM-DEXTROSE 2-3 GM-% IV SOLR
2.0000 g | INTRAVENOUS | Status: AC
  Administered 2013-01-09: 2 g via INTRAVENOUS
  Filled 2013-01-08: qty 50

## 2013-01-09 ENCOUNTER — Encounter (HOSPITAL_COMMUNITY)
Admission: RE | Disposition: A | Discharge status: Home / Self Care | Payer: Self-pay | Source: Ambulatory Visit | Attending: Neurosurgery

## 2013-01-09 ENCOUNTER — Ambulatory Visit (HOSPITAL_COMMUNITY): Payer: 59

## 2013-01-09 ENCOUNTER — Ambulatory Visit (HOSPITAL_COMMUNITY)
Admission: RE | Admit: 2013-01-09 | Discharge: 2013-01-10 | Disposition: A | Discharge status: Home / Self Care | Payer: 59 | Source: Ambulatory Visit | Attending: Neurosurgery | Admitting: Neurosurgery

## 2013-01-09 ENCOUNTER — Encounter (HOSPITAL_COMMUNITY): Payer: Self-pay | Admitting: Certified Registered Nurse Anesthetist

## 2013-01-09 ENCOUNTER — Encounter (HOSPITAL_COMMUNITY): Payer: Self-pay | Admitting: *Deleted

## 2013-01-09 ENCOUNTER — Ambulatory Visit (HOSPITAL_COMMUNITY): Payer: 59 | Admitting: Certified Registered Nurse Anesthetist

## 2013-01-09 DIAGNOSIS — J4489 Other specified chronic obstructive pulmonary disease: Secondary | ICD-10-CM | POA: Insufficient documentation

## 2013-01-09 DIAGNOSIS — F17200 Nicotine dependence, unspecified, uncomplicated: Secondary | ICD-10-CM | POA: Insufficient documentation

## 2013-01-09 DIAGNOSIS — K219 Gastro-esophageal reflux disease without esophagitis: Secondary | ICD-10-CM | POA: Insufficient documentation

## 2013-01-09 DIAGNOSIS — M5126 Other intervertebral disc displacement, lumbar region: Secondary | ICD-10-CM | POA: Insufficient documentation

## 2013-01-09 DIAGNOSIS — J449 Chronic obstructive pulmonary disease, unspecified: Secondary | ICD-10-CM | POA: Insufficient documentation

## 2013-01-09 HISTORY — PX: LUMBAR LAMINECTOMY/DECOMPRESSION MICRODISCECTOMY: SHX5026

## 2013-01-09 SURGERY — LUMBAR LAMINECTOMY/DECOMPRESSION MICRODISCECTOMY 2 LEVELS
Anesthesia: General | Site: Back | Laterality: Left | Wound class: Clean

## 2013-01-09 MED ORDER — OXYCODONE HCL 5 MG/5ML PO SOLN: 5.0000 mg | Freq: Once | ORAL | Status: DC | PRN

## 2013-01-09 MED ORDER — METHYLPREDNISOLONE ACETATE 80 MG/ML IJ SUSP
INTRAMUSCULAR | Status: DC | PRN
  Administered 2013-01-09: 80 mg via INTRA_ARTICULAR

## 2013-01-09 MED ORDER — MENTHOL 3 MG MT LOZG: 1.0000 | LOZENGE | OROMUCOSAL | Status: DC | PRN

## 2013-01-09 MED ORDER — MIDAZOLAM HCL 5 MG/5ML IJ SOLN
INTRAMUSCULAR | Status: DC | PRN
  Administered 2013-01-09: 2 mg via INTRAVENOUS

## 2013-01-09 MED ORDER — HYDROCODONE-ACETAMINOPHEN 5-325 MG PO TABS: 1.0000 | ORAL_TABLET | ORAL | Status: DC | PRN

## 2013-01-09 MED ORDER — LIDOCAINE-EPINEPHRINE 0.5 %-1:200000 IJ SOLN
INTRAMUSCULAR | Status: DC | PRN
  Administered 2013-01-09: 10 mL

## 2013-01-09 MED ORDER — SODIUM CHLORIDE 0.9 % IJ SOLN
3.0000 mL | Freq: Two times a day (BID) | INTRAMUSCULAR | Status: DC
  Administered 2013-01-09: 3 mL via INTRAVENOUS

## 2013-01-09 MED ORDER — ONDANSETRON HCL 4 MG/2ML IJ SOLN: 4.0000 mg | INTRAMUSCULAR | Status: DC | PRN

## 2013-01-09 MED ORDER — ONDANSETRON HCL 4 MG/2ML IJ SOLN
INTRAMUSCULAR | Status: DC | PRN
  Administered 2013-01-09: 4 mg via INTRAVENOUS

## 2013-01-09 MED ORDER — THROMBIN 5000 UNITS EX SOLR
CUTANEOUS | Status: DC | PRN
  Administered 2013-01-09 (×2): 5000 [IU] via TOPICAL

## 2013-01-09 MED ORDER — POTASSIUM CHLORIDE IN NACL 20-0.9 MEQ/L-% IV SOLN
INTRAVENOUS | Status: DC
  Filled 2013-01-09 (×3): qty 1000

## 2013-01-09 MED ORDER — SODIUM CHLORIDE 0.9 % IJ SOLN: 3.0000 mL | INTRAMUSCULAR | Status: DC | PRN

## 2013-01-09 MED ORDER — ARTIFICIAL TEARS OP OINT
TOPICAL_OINTMENT | OPHTHALMIC | Status: DC | PRN
  Administered 2013-01-09: 1 via OPHTHALMIC

## 2013-01-09 MED ORDER — POLYETHYLENE GLYCOL 3350 17 G PO PACK
17.0000 g | PACK | Freq: Every day | ORAL | Status: DC | PRN
  Filled 2013-01-09: qty 1

## 2013-01-09 MED ORDER — LIDOCAINE HCL (CARDIAC) 20 MG/ML IV SOLN
INTRAVENOUS | Status: DC | PRN
  Administered 2013-01-09: 40 mg via INTRAVENOUS

## 2013-01-09 MED ORDER — DIAZEPAM 5 MG PO TABS: 5.0000 mg | ORAL_TABLET | Freq: Four times a day (QID) | ORAL | Status: DC | PRN

## 2013-01-09 MED ORDER — KETOROLAC TROMETHAMINE 30 MG/ML IJ SOLN
30.0000 mg | Freq: Four times a day (QID) | INTRAMUSCULAR | Status: DC
  Administered 2013-01-09 – 2013-01-10 (×2): 30 mg via INTRAVENOUS
  Filled 2013-01-09 (×6): qty 1

## 2013-01-09 MED ORDER — OXYCODONE-ACETAMINOPHEN 5-325 MG PO TABS
1.0000 | ORAL_TABLET | ORAL | Status: DC | PRN
  Administered 2013-01-09: 2 via ORAL
  Administered 2013-01-10 (×2): 1 via ORAL
  Filled 2013-01-09 (×2): qty 1
  Filled 2013-01-09: qty 2

## 2013-01-09 MED ORDER — FENTANYL CITRATE 0.05 MG/ML IJ SOLN
INTRAMUSCULAR | Status: DC | PRN
  Administered 2013-01-09: 100 ug via INTRAVENOUS

## 2013-01-09 MED ORDER — FENTANYL CITRATE 0.05 MG/ML IJ SOLN
INTRAMUSCULAR | Status: AC
  Filled 2013-01-09: qty 2

## 2013-01-09 MED ORDER — MORPHINE SULFATE 2 MG/ML IJ SOLN: 1.0000 mg | INTRAMUSCULAR | Status: DC | PRN

## 2013-01-09 MED ORDER — PHENYLEPHRINE HCL 10 MG/ML IJ SOLN
INTRAMUSCULAR | Status: DC | PRN
  Administered 2013-01-09 (×3): 40 ug via INTRAVENOUS
  Administered 2013-01-09: 80 ug via INTRAVENOUS
  Administered 2013-01-09: 40 ug via INTRAVENOUS

## 2013-01-09 MED ORDER — MIRABEGRON ER 25 MG PO TB24
25.0000 mg | ORAL_TABLET | Freq: Every day | ORAL | Status: DC
  Filled 2013-01-09 (×2): qty 1

## 2013-01-09 MED ORDER — SENNA 8.6 MG PO TABS
1.0000 | ORAL_TABLET | Freq: Two times a day (BID) | ORAL | Status: DC
  Filled 2013-01-09 (×3): qty 1

## 2013-01-09 MED ORDER — NEOSTIGMINE METHYLSULFATE 1 MG/ML IJ SOLN
INTRAMUSCULAR | Status: DC | PRN
  Administered 2013-01-09: 4 mg via INTRAVENOUS

## 2013-01-09 MED ORDER — PROMETHAZINE HCL 25 MG/ML IJ SOLN: 6.2500 mg | INTRAMUSCULAR | Status: DC | PRN

## 2013-01-09 MED ORDER — HEMOSTATIC AGENTS (NO CHARGE) OPTIME
TOPICAL | Status: DC | PRN
  Administered 2013-01-09: 1 via TOPICAL

## 2013-01-09 MED ORDER — ACETAMINOPHEN 650 MG RE SUPP: 650.0000 mg | RECTAL | Status: DC | PRN

## 2013-01-09 MED ORDER — ACETAMINOPHEN 325 MG PO TABS: 650.0000 mg | ORAL_TABLET | ORAL | Status: DC | PRN

## 2013-01-09 MED ORDER — PHENAZOPYRIDINE HCL 200 MG PO TABS
200.0000 mg | ORAL_TABLET | Freq: Three times a day (TID) | ORAL | Status: DC | PRN
  Administered 2013-01-09: 200 mg via ORAL
  Filled 2013-01-09 (×2): qty 1

## 2013-01-09 MED ORDER — ROCURONIUM BROMIDE 100 MG/10ML IV SOLN
INTRAVENOUS | Status: DC | PRN
  Administered 2013-01-09: 50 mg via INTRAVENOUS

## 2013-01-09 MED ORDER — HYDROMORPHONE HCL PF 1 MG/ML IJ SOLN
INTRAMUSCULAR | Status: AC
  Filled 2013-01-09: qty 1

## 2013-01-09 MED ORDER — MIDAZOLAM HCL 2 MG/2ML IJ SOLN: 0.5000 mg | Freq: Once | INTRAMUSCULAR | Status: DC | PRN

## 2013-01-09 MED ORDER — GLYCOPYRROLATE 0.2 MG/ML IJ SOLN
INTRAMUSCULAR | Status: DC | PRN
  Administered 2013-01-09: .6 mg via INTRAVENOUS

## 2013-01-09 MED ORDER — FENTANYL CITRATE 0.05 MG/ML IJ SOLN
INTRAMUSCULAR | Status: DC | PRN
  Administered 2013-01-09: 150 ug via INTRAVENOUS
  Administered 2013-01-09: 100 ug via INTRAVENOUS

## 2013-01-09 MED ORDER — PHENOL 1.4 % MT LIQD: 1.0000 | OROMUCOSAL | Status: DC | PRN

## 2013-01-09 MED ORDER — OXYCODONE HCL 5 MG PO TABS: 5.0000 mg | ORAL_TABLET | Freq: Once | ORAL | Status: DC | PRN

## 2013-01-09 MED ORDER — 0.9 % SODIUM CHLORIDE (POUR BTL) OPTIME
TOPICAL | Status: DC | PRN
  Administered 2013-01-09: 1000 mL

## 2013-01-09 MED ORDER — LACTATED RINGERS IV SOLN
INTRAVENOUS | Status: DC
Start: 2013-01-09 — End: 2013-01-09
  Administered 2013-01-09: 11:00:00 via INTRAVENOUS

## 2013-01-09 MED ORDER — PROPOFOL 10 MG/ML IV BOLUS
INTRAVENOUS | Status: DC | PRN
  Administered 2013-01-09: 150 mg via INTRAVENOUS

## 2013-01-09 MED ORDER — HYDROMORPHONE HCL PF 1 MG/ML IJ SOLN
0.2500 mg | INTRAMUSCULAR | Status: DC | PRN
  Administered 2013-01-09 (×2): 0.5 mg via INTRAVENOUS

## 2013-01-09 MED ORDER — LACTATED RINGERS IV SOLN
INTRAVENOUS | Status: DC | PRN
  Administered 2013-01-09 (×2): via INTRAVENOUS

## 2013-01-09 MED ORDER — MEPERIDINE HCL 25 MG/ML IJ SOLN: 6.2500 mg | INTRAMUSCULAR | Status: DC | PRN

## 2013-01-09 SURGICAL SUPPLY — 62 items
ADH SKN CLS APL DERMABOND .7 (GAUZE/BANDAGES/DRESSINGS) ×1
APL SKNCLS STERI-STRIP NONHPOA (GAUZE/BANDAGES/DRESSINGS)
APL SRG 60D 8 XTD TIP BNDBL (TIP) ×1
BAG DECANTER FOR FLEXI CONT (MISCELLANEOUS) ×2 IMPLANT
BENZOIN TINCTURE PRP APPL 2/3 (GAUZE/BANDAGES/DRESSINGS) IMPLANT
BLADE SURG ROTATE 9660 (MISCELLANEOUS) IMPLANT
BUR MATCHSTICK NEURO 3.0 LAGG (BURR) ×2 IMPLANT
CANISTER SUCTION 2500CC (MISCELLANEOUS) ×2 IMPLANT
CLOTH BEACON ORANGE TIMEOUT ST (SAFETY) ×2 IMPLANT
CONT SPEC 4OZ CLIKSEAL STRL BL (MISCELLANEOUS) ×2 IMPLANT
DECANTER SPIKE VIAL GLASS SM (MISCELLANEOUS) ×1 IMPLANT
DERMABOND ADVANCED (GAUZE/BANDAGES/DRESSINGS) ×1
DERMABOND ADVANCED .7 DNX12 (GAUZE/BANDAGES/DRESSINGS) ×1 IMPLANT
DRAPE LAPAROTOMY 100X72X124 (DRAPES) ×2 IMPLANT
DRAPE MICROSCOPE LEICA (MISCELLANEOUS) ×2 IMPLANT
DRAPE POUCH INSTRU U-SHP 10X18 (DRAPES) ×2 IMPLANT
DRAPE SURG 17X23 STRL (DRAPES) ×2 IMPLANT
DURAPREP 26ML APPLICATOR (WOUND CARE) ×2 IMPLANT
DURASEAL APPLICATOR TIP (TIP) ×1 IMPLANT
DURASEAL SPINE SEALANT 3ML (MISCELLANEOUS) ×1 IMPLANT
ELECT REM PT RETURN 9FT ADLT (ELECTROSURGICAL) ×2
ELECTRODE REM PT RTRN 9FT ADLT (ELECTROSURGICAL) ×1 IMPLANT
GAUZE SPONGE 4X4 16PLY XRAY LF (GAUZE/BANDAGES/DRESSINGS) IMPLANT
GLOVE BIO SURGEON STRL SZ8 (GLOVE) ×1 IMPLANT
GLOVE BIOGEL PI IND STRL 7.0 (GLOVE) IMPLANT
GLOVE BIOGEL PI IND STRL 8.5 (GLOVE) IMPLANT
GLOVE BIOGEL PI INDICATOR 7.0 (GLOVE) ×1
GLOVE BIOGEL PI INDICATOR 8.5 (GLOVE) ×1
GLOVE ECLIPSE 6.5 STRL STRAW (GLOVE) ×2 IMPLANT
GLOVE EXAM NITRILE LRG STRL (GLOVE) IMPLANT
GLOVE EXAM NITRILE MD LF STRL (GLOVE) IMPLANT
GLOVE EXAM NITRILE XL STR (GLOVE) IMPLANT
GLOVE EXAM NITRILE XS STR PU (GLOVE) IMPLANT
GLOVE SURG SS PI 7.0 STRL IVOR (GLOVE) ×2 IMPLANT
GOWN BRE IMP SLV AUR LG STRL (GOWN DISPOSABLE) ×3 IMPLANT
GOWN BRE IMP SLV AUR XL STRL (GOWN DISPOSABLE) ×2 IMPLANT
GOWN STRL REIN 2XL LVL4 (GOWN DISPOSABLE) IMPLANT
KIT BASIN OR (CUSTOM PROCEDURE TRAY) ×2 IMPLANT
KIT ROOM TURNOVER OR (KITS) ×2 IMPLANT
NDL HYPO 18GX1.5 BLUNT FILL (NEEDLE) IMPLANT
NDL HYPO 25X1 1.5 SAFETY (NEEDLE) ×1 IMPLANT
NDL SPNL 18GX3.5 QUINCKE PK (NEEDLE) IMPLANT
NEEDLE HYPO 18GX1.5 BLUNT FILL (NEEDLE) ×2 IMPLANT
NEEDLE HYPO 25X1 1.5 SAFETY (NEEDLE) ×2 IMPLANT
NEEDLE SPNL 18GX3.5 QUINCKE PK (NEEDLE) ×2 IMPLANT
NS IRRIG 1000ML POUR BTL (IV SOLUTION) ×2 IMPLANT
PACK LAMINECTOMY NEURO (CUSTOM PROCEDURE TRAY) ×2 IMPLANT
PAD ARMBOARD 7.5X6 YLW CONV (MISCELLANEOUS) ×6 IMPLANT
RUBBERBAND STERILE (MISCELLANEOUS) ×4 IMPLANT
SPONGE GAUZE 4X4 12PLY (GAUZE/BANDAGES/DRESSINGS) IMPLANT
SPONGE LAP 4X18 X RAY DECT (DISPOSABLE) IMPLANT
SPONGE SURGIFOAM ABS GEL SZ50 (HEMOSTASIS) ×2 IMPLANT
STRIP CLOSURE SKIN 1/2X4 (GAUZE/BANDAGES/DRESSINGS) IMPLANT
SUT VIC AB 0 CT1 18XCR BRD8 (SUTURE) ×1 IMPLANT
SUT VIC AB 0 CT1 8-18 (SUTURE) ×2
SUT VIC AB 2-0 CT1 18 (SUTURE) ×2 IMPLANT
SUT VIC AB 3-0 SH 8-18 (SUTURE) ×3 IMPLANT
SYR 20ML ECCENTRIC (SYRINGE) ×2 IMPLANT
SYR 5ML LL (SYRINGE) ×1 IMPLANT
TOWEL OR 17X24 6PK STRL BLUE (TOWEL DISPOSABLE) ×2 IMPLANT
TOWEL OR 17X26 10 PK STRL BLUE (TOWEL DISPOSABLE) ×2 IMPLANT
WATER STERILE IRR 1000ML POUR (IV SOLUTION) ×2 IMPLANT

## 2013-01-09 NOTE — Plan of Care (Signed)
Problem: Consults Goal: Diagnosis - Spinal Surgery Outcome: Completed/Met Date Met:  01/09/13 Microdiscectomy     

## 2013-01-09 NOTE — Preoperative (Signed)
Beta Blockers   Reason not to administer Beta Blockers:Not Applicable 

## 2013-01-09 NOTE — Anesthesia Preprocedure Evaluation (Signed)
Anesthesia Evaluation  Patient identified by MRN, date of birth, ID band Patient awake    Reviewed: Allergy & Precautions, H&P , NPO status , Patient's Chart, lab work & pertinent test results  History of Anesthesia Complications Negative for: history of anesthetic complications  Airway Mallampati: II TM Distance: >3 FB Neck ROM: Full    Dental  (+) Missing and Dental Advisory Given   Pulmonary COPDCurrent Smoker,  breath sounds clear to auscultation  Pulmonary exam normal       Cardiovascular negative cardio ROS  Rhythm:Regular Rate:Normal     Neuro/Psych Chronic back pain: narcotics    GI/Hepatic Neg liver ROS, GERD-  Controlled,  Endo/Other  negative endocrine ROS  Renal/GU    Bladder cancer    Musculoskeletal   Abdominal   Peds  Hematology negative hematology ROS (+)   Anesthesia Other Findings   Reproductive/Obstetrics                           Anesthesia Physical Anesthesia Plan  ASA: III  Anesthesia Plan: General   Post-op Pain Management:    Induction: Intravenous  Airway Management Planned: Oral ETT  Additional Equipment:   Intra-op Plan:   Post-operative Plan: Extubation in OR  Informed Consent: I have reviewed the patients History and Physical, chart, labs and discussed the procedure including the risks, benefits and alternatives for the proposed anesthesia with the patient or authorized representative who has indicated his/her understanding and acceptance.   Dental advisory given  Plan Discussed with: CRNA and Surgeon  Anesthesia Plan Comments: (Plan routine monitors, GETA)        Anesthesia Quick Evaluation

## 2013-01-09 NOTE — Transfer of Care (Signed)
Immediate Anesthesia Transfer of Care Note  Patient: Harry Young  Procedure(s) Performed: Procedure(s): Left  lumbar four-five microdiskectomy (Left)  Patient Location: PACU  Anesthesia Type:General  Level of Consciousness: awake, sedated and patient cooperative  Airway & Oxygen Therapy: Patient Spontanous Breathing and Patient connected to nasal cannula oxygen  Post-op Assessment: Report given to PACU RN, Post -op Vital signs reviewed and stable and Patient moving all extremities  Post vital signs: Reviewed and stable  Complications: No apparent anesthesia complications

## 2013-01-09 NOTE — Anesthesia Procedure Notes (Signed)
Procedure Name: Intubation Date/Time: 01/09/2013 2:25 PM Performed by: Margaree Mackintosh Pre-anesthesia Checklist: Patient identified, Timeout performed, Emergency Drugs available, Suction available and Patient being monitored Patient Re-evaluated:Patient Re-evaluated prior to inductionOxygen Delivery Method: Circle system utilized Preoxygenation: Pre-oxygenation with 100% oxygen Intubation Type: IV induction Ventilation: Mask ventilation with difficulty Laryngoscope Size: Mac and 3 Grade View: Grade II Tube type: Oral Tube size: 7.5 mm Number of attempts: 1 Airway Equipment and Method: Stylet Placement Confirmation: ETT inserted through vocal cords under direct vision,  positive ETCO2 and breath sounds checked- equal and bilateral Secured at: 25 cm Tube secured with: Tape Dental Injury: Teeth and Oropharynx as per pre-operative assessment

## 2013-01-09 NOTE — H&P (Signed)
BP 109/67  Pulse 84  Temp(Src) 97.9 F (36.6 C) (Oral)  Resp 18  SpO2 94%      Harry Young is a patient of mine whom I took to the operating room in September 2007 where he underwent a far lateral discectomy at L2-3 on the left and foraminotomy to decompress the L3 root, L4 root, and L5 root with microdissection.  He had lateral recess stenosis at L3-4 and at L4-5.  At that time, he presented with significant weakness in the left lower extremity.  Harry Young frankly did quite well and I last saw him in November 2007.  He was doing well until about the last two weeks.  He states that he had terrific pain in that period of time and that the pain is in his back, left lower extremity and radiates into the thigh and leg.  He was working in his yard, thought he pulled a muscle and the next week he woke up and could not hardly walk on his own words.  He is 57 years of age, works for [employer] and is left-handed.   REVIEW OF SYSTEMS:                                    Positive for blood in his urine, leg pain with walking and at rest, leg weakness, and back pain.  He denies allergic, hematologic, endocrine, psychiatric, neurological, skin, gastrointestinal, respiratory, ear, nose, throat, mouth, eye, constitutional.  His pain chart, he has pain in the anterior left thigh and leg, posterior left thigh and leg, some pain in the right buttocks.  He states the pain now compared to when it started on 11/30/2012 is approximately the same.  Harry Young completed a round of prednisone without improvement.  He was also prescribed cyclobenzaprine and hydromorphone for a period of time.   PAST MEDICAL HISTORY:            Current Medical Conditions:  Significant for previous discectomy and lumbar surgery and he has bladder cancer.            Prior Operations:  He has undergone lumbar surgery.  He has had two surgeries for his bladder cancer and is actually due for another one in the next week or so.  Past surgical  history also includes cystoscopy in the past.            Medications and Allergies:  HE IS ALLERGIC TO SULFA ANTIBIOTICS.  Current medications are oxycodone 5 mg, 325 mg acetaminophen.   FAMILY HISTORY:                                            Mother is 27, in good health and has had some back problems.  His father died of congestive heart failure at age 15.   SOCIAL HISTORY:                                            He does smoke a pack of cigarettes a day.  He does not use alcohol.  He does not use illicit drugs.   PHYSICAL EXAMINATION:  Vital signs today, he is 70.87 inches in height, he weighs 168 pounds, blood pressure is 113/73, and he has a pulse of 77.  On examination, he is alert and oriented x4 and answering all questions appropriately.  Memory, language, attention span and fund of knowledge are normal.  He is in obvious distress, walks hunched and somewhat curled up.  He appears unsteady even with his cane.  Strength is full on manual examination.  Reflex is intact 2+ at the knees and at both ankles.  Normal examination of the upper extremities.  Proprioception is intact in the lower extremities and upper extremities.  Muscle tone, bulk, and coordination is otherwise normal.   DIAGNOSTIC DATA:                                          MRI of the lumbar spine is reviewed.  This was not done with contrast and he said he could not hold out to complete the full exam.  There are no axial T2 images.  Where he had the surgery is where the radiologist placed an arrow at each level.  It does not appear at L3-4 and L4-5 that he has any compression of the nerve root, but again not done with contrast and these areas have not been operated on in the past, does make it somewhat difficult to actually know.  At L2-3, there is certainly something there, but again I do not know if this is scar or not.    What the myelogram shows is a far lateral disc at L4-5, seen also on the  MRI, where he did not have the axials, and in L3-4, which is far less impressive. L2-3 really has a lot of scar. I am going to insist this is  scar despite no contrast. He is not hurting in the same way. The pain goes all the way down to his foot, which makes it much more likely to be L4-5, and he is not weak in the hip flexors.   PHYSICAL EXAMINATION:                    He is weak in the quadriceps on the left side.   IMPRESSION/PLAN:                             I will plan on taking him to the operating room for a far lateral discectomy at L4-5, on the left side. I will also look at L3-4, but if I see enough at L4-5, I am not going to open that area at all. The risks and benefits, he understands, having had the operation -- bleeding, infection, and no relief.

## 2013-01-09 NOTE — Anesthesia Postprocedure Evaluation (Signed)
  Anesthesia Post-op Note  Patient: BRAUN ROCCA  Procedure(s) Performed: Procedure(s): Left  lumbar four-five microdiskectomy (Left)  Patient Location: PACU  Anesthesia Type:General  Level of Consciousness: awake, alert , oriented and patient cooperative  Airway and Oxygen Therapy: Patient Spontanous Breathing and Patient connected to nasal cannula oxygen  Post-op Pain: mild  Post-op Assessment: Post-op Vital signs reviewed, Patient's Cardiovascular Status Stable, Respiratory Function Stable, Patent Airway, No signs of Nausea or vomiting and Pain level controlled  Post-op Vital Signs: Reviewed and stable  Complications: No apparent anesthesia complications

## 2013-01-09 NOTE — Op Note (Signed)
01/09/2013  3:51 PM  PATIENT:  Patrica Duel  57 y.o. 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 Heckendorn was taken to the operating room, intubated and placed under a general anesthetic without difficulty. He was positioned prone on a Wilson frame with all pressure points padded. His back was prepped and draped in a sterile manner. I opened the skin with a 10 blade and carried the dissection down to the thoracolumbar fascia. I used both sharp dissection and the monopolar cautery to expose the lamina of L4, and L3. I confirmed my location with an intraoperative xray. I believe I broached the thecal sac with my suction tip in the interlaminar space between L3, and 4. Since the pathology was not in the canal there was no reason to further expose the sac through scar tissue just to see if I did. At the end of the case I placed a small piece of muscle over the scar tissue and used duraseal. The L4 root was normal size and tension.  I used the drill, Kerrison punches, to drill out the lateral pars of L4. I used the punches to remove the ligamentum flavum to expose the L4 root in the extra foraminal space. I brought the microscope into the operative field and with Dr.Stern's assistance we started our decompression of the  L4 root(s).  I dissected with a nerve hook and penfield 4 around the root and was able to express disc material.  I used pituitary  rongeurs, and other instruments to remove disc material. I removed a large free fragment of disc which was underneath the nerve root.  After the discectomy was completed we inspected the  nerve root and felt it was well decompressed. I explored rostrally, laterally, medially, and caudally and was satisfied with the decompression. I irrigated the wound, then closed in layers. I approximated the thoracolumbar fascia, subcutaneous, and subcuticular planes with vicryl sutures. I used dermabond for a sterile dressing.   PLAN OF CARE: Admit for overnight observation  PATIENT DISPOSITION:  PACU - hemodynamically stable.   Delay start of Pharmacological VTE agent (>24hrs) due to surgical blood loss or risk of bleeding:  yes

## 2013-01-10 ENCOUNTER — Encounter (HOSPITAL_COMMUNITY): Payer: Self-pay | Admitting: Neurosurgery

## 2013-01-10 MED ORDER — HYDROCODONE-ACETAMINOPHEN 5-325 MG PO TABS: 1.0000 | ORAL_TABLET | Freq: Four times a day (QID) | ORAL | Status: DC | PRN

## 2013-01-10 NOTE — Discharge Summary (Signed)
Physician Discharge Summary  Patient ID: Harry Young MRN: [id number] DOB/AGE: 1955/05/30 57 y.o.  Admit date: 01/09/2013 Discharge date: 01/10/2013  Admission Diagnoses: Far lateral disc herniation left L4/5, Left L4 radiculopathy  Discharge Diagnoses: Far lateral disc herniation left L4/5, Left L4 radiculopathy Active Problems:   * No active hospital problems. *   Discharged Condition: good  Hospital Course: [name] was taken to the operating room, and underwent an uncomplicated far lateral discetomy. Post op he has done very well and was voiding and eating this morning. Strength is good in the left lower extremity. The wound is clean, dry, and without signs of infection.   Consults: None  Significant Diagnostic Studies: none  Treatments: surgery: as above  Discharge Exam: Blood pressure 107/60, pulse 94, temperature 99.3 F (37.4 C), temperature source Oral, resp. rate 18, SpO2 95.00%. General appearance: alert, cooperative and appears stated age Neurologic: Alert and oriented X 3, normal strength and tone. Normal symmetric reflexes. Normal coordination and gait  Disposition: 01-Home or Self Care     Medication List         HYDROcodone-acetaminophen 5-325 MG per tablet  Commonly known as:  NORCO/VICODIN  Take 1 tablet by mouth every 6 (six) hours as needed for pain.     HYDROcodone-acetaminophen 7.5-325 MG per tablet  Commonly known as:  NORCO  Take 1 tablet by mouth every 6 (six) hours as needed for pain.     MYRBETRIQ 25 MG Tb24 tablet  Generic drug:  mirabegron ER  Take 25 mg by mouth daily.     phenazopyridine 200 MG tablet  Commonly known as:  PYRIDIUM  Take 1 tablet (200 mg total) by mouth 3 (three) times daily as needed for pain.           Follow-up Information   Follow up with Alfreddie Consalvo L, MD In 1 month. (call to make appointment)    Specialty:  Neurosurgery   Contact information:   [address]                         [address] [place] [address] [phone]       Signed: Darien Mignogna L 01/10/2013, 4:04 PM

## 2013-01-12 ENCOUNTER — Encounter (HOSPITAL_COMMUNITY): Payer: Self-pay | Admitting: Emergency Medicine

## 2013-01-12 ENCOUNTER — Inpatient Hospital Stay (HOSPITAL_COMMUNITY)
Admission: EM | Admit: 2013-01-12 | Discharge: 2013-01-17 | DRG: 029 | Disposition: A | Discharge status: Home / Self Care | Payer: 59 | Attending: Neurosurgery | Admitting: Neurosurgery

## 2013-01-12 DIAGNOSIS — J4489 Other specified chronic obstructive pulmonary disease: Secondary | ICD-10-CM | POA: Diagnosis present

## 2013-01-12 DIAGNOSIS — J449 Chronic obstructive pulmonary disease, unspecified: Secondary | ICD-10-CM | POA: Diagnosis present

## 2013-01-12 DIAGNOSIS — Z981 Arthrodesis status: Secondary | ICD-10-CM

## 2013-01-12 DIAGNOSIS — Y9210 Unspecified residential institution as the place of occurrence of the external cause: Secondary | ICD-10-CM | POA: Diagnosis present

## 2013-01-12 DIAGNOSIS — R51 Headache: Secondary | ICD-10-CM | POA: Diagnosis present

## 2013-01-12 DIAGNOSIS — G9601 Cranial cerebrospinal fluid leak, spontaneous: Secondary | ICD-10-CM | POA: Diagnosis present

## 2013-01-12 DIAGNOSIS — IMO0002 Reserved for concepts with insufficient information to code with codable children: Secondary | ICD-10-CM | POA: Diagnosis present

## 2013-01-12 DIAGNOSIS — G988 Other disorders of nervous system: Principal | ICD-10-CM | POA: Diagnosis present

## 2013-01-12 DIAGNOSIS — Z882 Allergy status to sulfonamides status: Secondary | ICD-10-CM

## 2013-01-12 DIAGNOSIS — F17200 Nicotine dependence, unspecified, uncomplicated: Secondary | ICD-10-CM | POA: Diagnosis present

## 2013-01-12 DIAGNOSIS — C679 Malignant neoplasm of bladder, unspecified: Secondary | ICD-10-CM | POA: Diagnosis present

## 2013-01-12 DIAGNOSIS — T819XXA Unspecified complication of procedure, initial encounter: Secondary | ICD-10-CM

## 2013-01-12 LAB — CBC WITH DIFFERENTIAL/PLATELET
Basophils Absolute: 0 10*3/uL (ref 0.0–0.1)
Basophils Relative: 0 % (ref 0–1)
Eosinophils Absolute: 0 10*3/uL (ref 0.0–0.7)
Eosinophils Relative: 0 % (ref 0–5)
HCT: 37.6 % — ABNORMAL LOW (ref 39.0–52.0)
Hemoglobin: 12.8 g/dL — ABNORMAL LOW (ref 13.0–17.0)
Lymphocytes Relative: 18 % (ref 12–46)
Lymphs Abs: 2.3 10*3/uL (ref 0.7–4.0)
MCH: 33.1 pg (ref 26.0–34.0)
MCHC: 34 g/dL (ref 30.0–36.0)
MCV: 97.2 fL (ref 78.0–100.0)
Monocytes Absolute: 1.1 10*3/uL — ABNORMAL HIGH (ref 0.1–1.0)
Monocytes Relative: 9 % (ref 3–12)
Neutro Abs: 9.3 10*3/uL — ABNORMAL HIGH (ref 1.7–7.7)
Neutrophils Relative %: 73 % (ref 43–77)
Platelets: 209 10*3/uL (ref 150–400)
RBC: 3.87 MIL/uL — ABNORMAL LOW (ref 4.22–5.81)
RDW: 14.2 % (ref 11.5–15.5)
WBC: 12.7 10*3/uL — ABNORMAL HIGH (ref 4.0–10.5)

## 2013-01-12 LAB — POCT I-STAT, CHEM 8
BUN: 20 mg/dL (ref 6–23)
Calcium, Ion: 1.16 mmol/L (ref 1.12–1.23)
Chloride: 103 mEq/L (ref 96–112)
Creatinine, Ser: 1.2 mg/dL (ref 0.50–1.35)
Glucose, Bld: 120 mg/dL — ABNORMAL HIGH (ref 70–99)
HCT: 38 % — ABNORMAL LOW (ref 39.0–52.0)
Hemoglobin: 12.9 g/dL — ABNORMAL LOW (ref 13.0–17.0)
Potassium: 3.8 mEq/L (ref 3.5–5.1)
Sodium: 141 mEq/L (ref 135–145)
TCO2: 30 mmol/L (ref 0–100)

## 2013-01-12 MED ORDER — CYCLOBENZAPRINE HCL 10 MG PO TABS
10.0000 mg | ORAL_TABLET | Freq: Three times a day (TID) | ORAL | Status: DC | PRN
  Administered 2013-01-12 – 2013-01-15 (×4): 10 mg via ORAL
  Filled 2013-01-12 (×4): qty 1

## 2013-01-12 MED ORDER — DEXTROSE 5 % IV SOLN
1.0000 g | Freq: Two times a day (BID) | INTRAVENOUS | Status: DC
  Administered 2013-01-12 – 2013-01-17 (×10): 1 g via INTRAVENOUS
  Filled 2013-01-12 (×12): qty 10

## 2013-01-12 MED ORDER — ZOLPIDEM TARTRATE 5 MG PO TABS
5.0000 mg | ORAL_TABLET | Freq: Every evening | ORAL | Status: DC | PRN
  Administered 2013-01-12: 5 mg via ORAL
  Filled 2013-01-12: qty 1

## 2013-01-12 MED ORDER — VANCOMYCIN HCL IN DEXTROSE 1-5 GM/200ML-% IV SOLN
1000.0000 mg | Freq: Two times a day (BID) | INTRAVENOUS | Status: DC
  Administered 2013-01-13 – 2013-01-17 (×10): 1000 mg via INTRAVENOUS
  Filled 2013-01-12 (×12): qty 200

## 2013-01-12 MED ORDER — OXYCODONE-ACETAMINOPHEN 5-325 MG PO TABS
1.0000 | ORAL_TABLET | ORAL | Status: DC | PRN
  Administered 2013-01-12 – 2013-01-14 (×2): 1 via ORAL
  Filled 2013-01-12 (×2): qty 1

## 2013-01-12 NOTE — ED Notes (Signed)
Describes drainage as: clear, dressing shirt and underwear soaked.

## 2013-01-12 NOTE — H&P (Signed)
Harry Young is an 57 y.o. male.   Chief Complaint: Possible CSF leak HPI: 57 year old male 4 days status post lumbar laminotomy and microdiscectomy. Surgery complicated by possible CSF leak. Patient discharged home. Patient is noted intermittent drainage of clear fluid from his lumbar wound. No history of fevers. No chills. Patient has recently begun having some postural headache. No lower extremity pain. No bowel or bladder dysfunction.  Past Medical History  Diagnosis Date  . Bladder tumor   . Bladder cancer   . Chronic back pain   . Bulging lumbar disc   . Smokers' cough   . Productive cough   . Low back pain radiating to left leg   . Constipation   . COPD (chronic obstructive pulmonary disease)   . Asthma     until 58 years old  . Chronic kidney disease     kidney stones    Past Surgical History  Procedure Laterality Date  . Lumbar disc surgery  09-20-2005    L2 -- L5  . Transurethral resection of bladder tumor  2011    bladder cancer  . Transurethral resection of bladder tumor N/A 12/13/2012    Procedure: TRANSURETHRAL RESECTION OF BLADDER TUMOR (TURBT) AND INSTILLATION OF MYTOMYCIN;  Surgeon: Garnett Farm, MD;  Location: [facility];  Service: Urology;  Laterality: N/A;  . Cystoscopy w/ retrogrades Bilateral 12/13/2012    Procedure: CYSTOSCOPY WITH RETROGRADE PYELOGRAM;  Surgeon: Garnett Farm, MD;  Location: [facility];  Service: Urology;  Laterality: Bilateral;  . Lumbar laminectomy/decompression microdiscectomy Left 01/09/2013    Procedure: Left  lumbar four-five microdiskectomy;  Surgeon: Carmela Hurt, MD;  Location: [facility];  Service: Neurosurgery;  Laterality: Left;    No family history on file. Social History:  reports that he has been smoking Cigarettes.  He has a 42 pack-year smoking history. He has never used smokeless tobacco. He reports that he does not drink alcohol or use illicit drugs.  Allergies:  Allergies  Allergen  Reactions  . Sulfa Antibiotics Rash     (Not in a hospital admission)  Results for orders placed during the hospital encounter of 01/12/13 (from the past 48 hour(s))  CBC WITH DIFFERENTIAL     Status: Abnormal   Collection Time    01/12/13  9:15 PM      Result Value Range   WBC 12.7 (*) 4.0 - 10.5 K/uL   RBC 3.87 (*) 4.22 - 5.81 MIL/uL   Hemoglobin 12.8 (*) 13.0 - 17.0 g/dL   HCT 16.1 (*) 09.6 - 04.5 %   MCV 97.2  78.0 - 100.0 fL   MCH 33.1  26.0 - 34.0 pg   MCHC 34.0  30.0 - 36.0 g/dL   RDW 40.9  81.1 - 91.4 %   Platelets 209  150 - 400 K/uL   Neutrophils Relative % 73  43 - 77 %   Neutro Abs 9.3 (*) 1.7 - 7.7 K/uL   Lymphocytes Relative 18  12 - 46 %   Lymphs Abs 2.3  0.7 - 4.0 K/uL   Monocytes Relative 9  3 - 12 %   Monocytes Absolute 1.1 (*) 0.1 - 1.0 K/uL   Eosinophils Relative 0  0 - 5 %   Eosinophils Absolute 0.0  0.0 - 0.7 K/uL   Basophils Relative 0  0 - 1 %   Basophils Absolute 0.0  0.0 - 0.1 K/uL  POCT I-STAT, CHEM 8     Status:  Abnormal   Collection Time    01/12/13  9:21 PM      Result Value Range   Sodium 141  135 - 145 mEq/L   Potassium 3.8  3.5 - 5.1 mEq/L   Chloride 103  96 - 112 mEq/L   BUN 20  6 - 23 mg/dL   Creatinine, Ser 4.54  0.50 - 1.35 mg/dL   Glucose, Bld 098 (*) 70 - 99 mg/dL   Calcium, Ion 1.19  1.47 - 1.23 mmol/L   TCO2 30  0 - 100 mmol/L   Hemoglobin 12.9 (*) 13.0 - 17.0 g/dL   HCT 82.9 (*) 56.2 - 13.0 %   No results found.  A comprehensive review of systems was negative.  Blood pressure 111/63, pulse 69, temperature 99.5 F (37.5 C), temperature source Oral, resp. rate 11, SpO2 92.00%.  Patient is awake and alert. He is oriented and appropriate. He appears nontoxic. Speech is fluent. Cranial nerve function is intact. Motor and sensory function of the extremities normal. Straight leg raising negative bilaterally. Wound currently is clean and dry. I cannot express any drainage from it the wound itself was flat. The skin edges are well  opposed. Chest is clear. Her regular rate and rhythm. Abdomen benign. 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 Plan to admit the patient to the hospital for observation. Keep on absolute bedrest. Observed for signs of further CSF leakage. I will place on prophylactic antibiotics. Should the patient experienced renewed cerebrospinal fluid leakage then I would recommend taking him to the operating room for reexploration of his wound and hope of repair of the CSF fistula.  Harry Young A 01/12/2013, 10:14 PM

## 2013-01-12 NOTE — ED Notes (Addendum)
No changes, Dr. Fredderick Phenix in to [facility], to update pt on NSURG consult, pending arrival of NSURG Dr. Jordan Likes, no changes, alert, NAD, calm, interactive. VSS, NSL placed.

## 2013-01-12 NOTE — ED Notes (Signed)
Lumbar incision visualized with Dr. Jordan Likes. Incision CDI. no current drainage. approximated well. No redness. Dry sterile abd pad applied to incision with hypafix fabric tape. Pt lying flat supine per Dr. Jordan Likes. Urinal at [facility]. Family x2 at [facility].

## 2013-01-12 NOTE — Progress Notes (Signed)
ANTIBIOTIC CONSULT NOTE - INITIAL  Pharmacy Consult for vancomycin Indication: CSF leak  Allergies  Allergen Reactions  . Sulfa Antibiotics Rash    Patient Measurements: Height: 5\' 10"  (177.8 cm) Weight: 166 lb 10.7 oz (75.6 kg) IBW/kg (Calculated) : 73  Vital Signs: Temp: 99.5 F (37.5 C) (08/31 2047) Temp src: Oral (08/31 2047) BP: 111/63 mmHg (08/31 2145) Pulse Rate: 69 (08/31 2145)  Labs:  Recent Labs  01/12/13 2115 01/12/13 2121  WBC 12.7*  --   HGB 12.8* 12.9*  PLT 209  --   CREATININE  --  1.20   Estimated Creatinine Clearance: 69.3 ml/min (by C-G formula based on Cr of 1.2).   Microbiology: Recent Results (from the past 720 hour(s))  SURGICAL PCR SCREEN     Status: None   Collection Time    01/07/13  3:40 PM      Result Value Range Status   MRSA, PCR NEGATIVE  NEGATIVE Final   Staphylococcus aureus NEGATIVE  NEGATIVE Final   Comment:            The Xpert SA Assay (FDA     approved for NASAL specimens     in patients over 58 years of age),     is one component of     a comprehensive surveillance     program.  Test performance has     been validated by [employer] for patients greater     than or equal to 35 year old.     It is not intended     to diagnose infection nor to     guide or monitor treatment.    Medical History: Past Medical History  Diagnosis Date  . Bladder tumor   . Bladder cancer   . Chronic back pain   . Bulging lumbar disc   . Smokers' cough   . Productive cough   . Low back pain radiating to left leg   . Constipation   . COPD (chronic obstructive pulmonary disease)   . Asthma     until 44 years old  . Chronic kidney disease     kidney stones    Assessment: 57yo male is 4d s/p lumbar laminotomy and microdiscectomy, c/o intermittent drainage of clear fluid from lumbar wound, per neurosurgery pt has probably postoperative stripper spinal fluid fistula w/ postural headache, to begin IV ABX during observation and  possible surgical reexploration of wound +/- repair of CSF fistula.  Goal of Therapy:  Vancomycin trough level 15-20 mcg/ml  Plan:  Will begin vancomycin 1000mg  IV Q12H and monitor CBC, Cx, levels prn.  Vernard Gambles, PharmD, BCPS  01/12/2013,11:23 PM

## 2013-01-12 NOTE — ED Notes (Signed)
Dr. Pool at [facility] ?

## 2013-01-12 NOTE — ED Provider Notes (Signed)
CSN: [id number]     Arrival date & time 01/12/13  2020 History   First MD Initiated Contact with Patient 01/12/13 2100     Chief Complaint  Patient presents with  . Post-op Problem   (Consider location/radiation/quality/duration/timing/severity/associated sxs/prior Treatment) HPI Comments: Patient presents with discharge from his surgical site. He had a lumbar microdiscectomy on August 28 by Dr. Franky Macho. He states that over the last 2 days he's had clear drainage from the site. He denies any increased pain from the site. He has some ongoing numbness to his foot but denies any change in the numbness. He denies any fevers or chills. He states that he soaked through several pads a day with his clear fluid. He denies any pussy drainage. He's had some headaches that have been increasing since yesterday. He seemed to be worse when he sits up or stands up and get better when he lays down. Currently is a complaining of a mild headache.   Past Medical History  Diagnosis Date  . Bladder tumor   . Bladder cancer   . Chronic back pain   . Bulging lumbar disc   . Smokers' cough   . Productive cough   . Low back pain radiating to left leg   . Constipation   . COPD (chronic obstructive pulmonary disease)   . Asthma     until 84 years old  . Chronic kidney disease     kidney stones   Past Surgical History  Procedure Laterality Date  . Lumbar disc surgery  09-20-2005    L2 -- L5  . Transurethral resection of bladder tumor  2011    bladder cancer  . Transurethral resection of bladder tumor N/A 12/13/2012    Procedure: TRANSURETHRAL RESECTION OF BLADDER TUMOR (TURBT) AND INSTILLATION OF MYTOMYCIN;  Surgeon: Garnett Farm, MD;  Location: [facility];  Service: Urology;  Laterality: N/A;  . Cystoscopy w/ retrogrades Bilateral 12/13/2012    Procedure: CYSTOSCOPY WITH RETROGRADE PYELOGRAM;  Surgeon: Garnett Farm, MD;  Location: [facility];  Service: Urology;  Laterality:  Bilateral;  . Lumbar laminectomy/decompression microdiscectomy Left 01/09/2013    Procedure: Left  lumbar four-five microdiskectomy;  Surgeon: Carmela Hurt, MD;  Location: [facility];  Service: Neurosurgery;  Laterality: Left;   No family history on file. History  Substance Use Topics  . Smoking status: Current Every Day Smoker -- 1.00 packs/day for 42 years    Types: Cigarettes  . Smokeless tobacco: Never Used  . Alcohol Use: No    Review of Systems  Constitutional: Negative for fever, chills, diaphoresis and fatigue.  HENT: Negative for congestion, rhinorrhea and sneezing.   Eyes: Negative.   Respiratory: Negative for cough, chest tightness and shortness of breath.   Cardiovascular: Negative for chest pain and leg swelling.  Gastrointestinal: Negative for nausea, vomiting, abdominal pain, diarrhea and blood in stool.  Genitourinary: Negative for frequency, hematuria, flank pain and difficulty urinating.  Musculoskeletal: Positive for back pain. Negative for arthralgias.  Skin: Negative for rash.  Neurological: Positive for numbness and headaches. Negative for dizziness, speech difficulty and weakness.    Allergies  Sulfa antibiotics  Home Medications   Current Outpatient Rx  Name  Route  Sig  Dispense  Refill  . HYDROcodone-acetaminophen (NORCO) 7.5-325 MG per tablet   Oral   Take 1 tablet by mouth every 6 (six) hours as needed for pain.         Marland Kitchen HYDROcodone-acetaminophen (NORCO/VICODIN) 5-325 MG  per tablet   Oral   Take 1 tablet by mouth every 6 (six) hours as needed for pain.   70 tablet   0   . phenazopyridine (PYRIDIUM) 200 MG tablet   Oral   Take 1 tablet (200 mg total) by mouth 3 (three) times daily as needed for pain.   30 tablet   0   . mirabegron ER (MYRBETRIQ) 25 MG TB24 tablet   Oral   Take 25 mg by mouth daily.          BP 111/63  Pulse 69  Temp(Src) 99.5 F (37.5 C) (Oral)  Resp 11  SpO2 92% Physical Exam  Constitutional: He is  oriented to person, place, and time. He appears well-developed and well-nourished.  HENT:  Head: Normocephalic and atraumatic.  Eyes: Pupils are equal, round, and reactive to light.  Neck: Normal range of motion. Neck supple.  Cardiovascular: Normal rate, regular rhythm and normal heart sounds.   Pulmonary/Chest: Effort normal and breath sounds normal. No respiratory distress. He has no wheezes. He has no rales. He exhibits no tenderness.  Abdominal: Soft. Bowel sounds are normal. There is no tenderness. There is no rebound and no guarding.  Musculoskeletal: Normal range of motion. He exhibits no edema.  Patient has a healing surgical incision in his midline lumbar spine. There is a small amount of clear fluid drainage. There is no erythema or warmth to the area. There's no purulent drainage. There is no swelling or significant pain on palpation of the area.  Lymphadenopathy:    He has no cervical adenopathy.  Neurological: He is alert and oriented to person, place, and time. He has normal strength. No cranial nerve deficit or sensory deficit. GCS eye subscore is 4. GCS verbal subscore is 5. GCS motor subscore is 6.  Patient has some decrease in sensation in his left foot compared to the right but he says this is chronic and unchanged from his baseline  Skin: Skin is warm and dry. No rash noted.  Psychiatric: He has a normal mood and affect.    ED Course  Procedures (including critical care time) Labs Review Results for orders placed during the hospital encounter of 01/12/13  CBC WITH DIFFERENTIAL      Result Value Range   WBC 12.7 (*) 4.0 - 10.5 K/uL   RBC 3.87 (*) 4.22 - 5.81 MIL/uL   Hemoglobin 12.8 (*) 13.0 - 17.0 g/dL   HCT 41.3 (*) 24.4 - 01.0 %   MCV 97.2  78.0 - 100.0 fL   MCH 33.1  26.0 - 34.0 pg   MCHC 34.0  30.0 - 36.0 g/dL   RDW 27.2  53.6 - 64.4 %   Platelets 209  150 - 400 K/uL   Neutrophils Relative % 73  43 - 77 %   Neutro Abs 9.3 (*) 1.7 - 7.7 K/uL   Lymphocytes  Relative 18  12 - 46 %   Lymphs Abs 2.3  0.7 - 4.0 K/uL   Monocytes Relative 9  3 - 12 %   Monocytes Absolute 1.1 (*) 0.1 - 1.0 K/uL   Eosinophils Relative 0  0 - 5 %   Eosinophils Absolute 0.0  0.0 - 0.7 K/uL   Basophils Relative 0  0 - 1 %   Basophils Absolute 0.0  0.0 - 0.1 K/uL  POCT I-STAT, CHEM 8      Result Value Range   Sodium 141  135 - 145 mEq/L   Potassium 3.8  3.5 - 5.1 mEq/L   Chloride 103  96 - 112 mEq/L   BUN 20  6 - 23 mg/dL   Creatinine, Ser 1.61  0.50 - 1.35 mg/dL   Glucose, Bld 096 (*) 70 - 99 mg/dL   Calcium, Ion 0.45  4.09 - 1.23 mmol/L   TCO2 30  0 - 100 mmol/L   Hemoglobin 12.9 (*) 13.0 - 17.0 g/dL   HCT 81.1 (*) 91.4 - 78.2 %    Imaging Review No results found.  MDM   1. Post-operative complication, initial encounter    I reviewed the operative note and there was a concern that the thecal sac was punctured during the surgery. Patient's symptoms are consistent with a possible spinal fluid leak. I discussed the case with Dr. Jordan Likes who is on call for neurosurgery and he is coming in to see the patient.    Rolan Bucco, MD 01/12/13 2206

## 2013-01-12 NOTE — ED Notes (Addendum)
Pt. reports discharge from surgical incision site at lower back onset Saturday , lumbar diskectomy by Dr. Mikal Plane last 01/09/2013 , slight soreness , denies fever or chills.

## 2013-01-12 NOTE — ED Notes (Addendum)
Surgery 8/28 for lumbar ruptured and herniated disc. Took one norco at 1600. Denies low back pain. Admits to HA 3/10.  Here to night for and concerned about drainage from lumbar incision site, pt of NSURG Dr. Franky Macho. Insurance nurse line concerned for possible CSF leak. Low grade fever noted. 99.5. Denies nausea, dizziness, sob, loss of control of bowel or bladder, change in LLE weakness, numbness or tingling or other sx. Last BM yesterday. Some constipation, taking stool softener PRN.

## 2013-01-13 ENCOUNTER — Encounter (HOSPITAL_COMMUNITY)
Admission: EM | Disposition: A | Discharge status: Home / Self Care | Payer: Self-pay | Source: Home / Self Care | Attending: Neurosurgery

## 2013-01-13 ENCOUNTER — Observation Stay (HOSPITAL_COMMUNITY): Payer: 59 | Admitting: Anesthesiology

## 2013-01-13 ENCOUNTER — Encounter (HOSPITAL_COMMUNITY): Payer: Self-pay | Admitting: Anesthesiology

## 2013-01-13 ENCOUNTER — Observation Stay (HOSPITAL_COMMUNITY): Payer: 59

## 2013-01-13 HISTORY — PX: LUMBAR WOUND DEBRIDEMENT: SHX1988

## 2013-01-13 LAB — SURGICAL PCR SCREEN
MRSA, PCR: NEGATIVE
Staphylococcus aureus: NEGATIVE

## 2013-01-13 SURGERY — LUMBAR WOUND DEBRIDEMENT
Anesthesia: General

## 2013-01-13 SURGERY — LUMBAR WOUND DEBRIDEMENT
Anesthesia: General | Site: Back | Wound class: Clean

## 2013-01-13 SURGERY — WOUND EXPLORATION
Anesthesia: General | Laterality: Bilateral

## 2013-01-13 MED ORDER — 0.9 % SODIUM CHLORIDE (POUR BTL) OPTIME
TOPICAL | Status: DC | PRN
  Administered 2013-01-13: 1000 mL

## 2013-01-13 MED ORDER — PHENOL 1.4 % MT LIQD: 1.0000 | OROMUCOSAL | Status: DC | PRN

## 2013-01-13 MED ORDER — HYDROCODONE-ACETAMINOPHEN 5-325 MG PO TABS
1.0000 | ORAL_TABLET | ORAL | Status: DC | PRN
  Administered 2013-01-14: 1 via ORAL
  Administered 2013-01-15: 2 via ORAL
  Administered 2013-01-15 (×2): 1 via ORAL
  Administered 2013-01-16 – 2013-01-17 (×3): 2 via ORAL
  Filled 2013-01-13: qty 2
  Filled 2013-01-13 (×2): qty 1
  Filled 2013-01-13: qty 2
  Filled 2013-01-13: qty 1
  Filled 2013-01-13 (×2): qty 2

## 2013-01-13 MED ORDER — ROCURONIUM BROMIDE 100 MG/10ML IV SOLN
INTRAVENOUS | Status: DC | PRN
  Administered 2013-01-13: 20 mg via INTRAVENOUS
  Administered 2013-01-13: 50 mg via INTRAVENOUS

## 2013-01-13 MED ORDER — SODIUM CHLORIDE 0.9 % IV SOLN: 250.0000 mL | INTRAVENOUS | Status: DC

## 2013-01-13 MED ORDER — ALUM & MAG HYDROXIDE-SIMETH 200-200-20 MG/5ML PO SUSP: 30.0000 mL | Freq: Four times a day (QID) | ORAL | Status: DC | PRN

## 2013-01-13 MED ORDER — ONDANSETRON HCL 4 MG/2ML IJ SOLN
INTRAMUSCULAR | Status: DC | PRN
  Administered 2013-01-13: 4 mg via INTRAVENOUS

## 2013-01-13 MED ORDER — HYDROMORPHONE HCL PF 1 MG/ML IJ SOLN
INTRAMUSCULAR | Status: AC
  Filled 2013-01-13: qty 1

## 2013-01-13 MED ORDER — ONDANSETRON HCL 4 MG/2ML IJ SOLN: 4.0000 mg | INTRAMUSCULAR | Status: DC | PRN

## 2013-01-13 MED ORDER — SENNA 8.6 MG PO TABS
1.0000 | ORAL_TABLET | Freq: Two times a day (BID) | ORAL | Status: DC
  Administered 2013-01-13 – 2013-01-17 (×10): 8.6 mg via ORAL
  Filled 2013-01-13 (×16): qty 1

## 2013-01-13 MED ORDER — BISACODYL 10 MG RE SUPP: 10.0000 mg | Freq: Every day | RECTAL | Status: DC | PRN

## 2013-01-13 MED ORDER — KETOROLAC TROMETHAMINE 30 MG/ML IJ SOLN
30.0000 mg | Freq: Four times a day (QID) | INTRAMUSCULAR | Status: DC
  Administered 2013-01-13 – 2013-01-17 (×17): 30 mg via INTRAVENOUS
  Filled 2013-01-13 (×21): qty 1

## 2013-01-13 MED ORDER — MIRABEGRON ER 25 MG PO TB24
25.0000 mg | ORAL_TABLET | Freq: Every day | ORAL | Status: DC
  Administered 2013-01-14 – 2013-01-15 (×2): 25 mg via ORAL
  Filled 2013-01-13 (×5): qty 1

## 2013-01-13 MED ORDER — PHENYLEPHRINE HCL 10 MG/ML IJ SOLN
INTRAMUSCULAR | Status: DC | PRN
  Administered 2013-01-13 (×2): 80 ug via INTRAVENOUS

## 2013-01-13 MED ORDER — OXYCODONE HCL 5 MG PO TABS: 5.0000 mg | ORAL_TABLET | Freq: Once | ORAL | Status: DC | PRN

## 2013-01-13 MED ORDER — MENTHOL 3 MG MT LOZG
1.0000 | LOZENGE | OROMUCOSAL | Status: DC | PRN
  Filled 2013-01-13: qty 9

## 2013-01-13 MED ORDER — HYDROMORPHONE HCL PF 1 MG/ML IJ SOLN
0.2500 mg | INTRAMUSCULAR | Status: DC | PRN
  Administered 2013-01-13 (×2): 0.5 mg via INTRAVENOUS

## 2013-01-13 MED ORDER — FLEET ENEMA 7-19 GM/118ML RE ENEM
1.0000 | ENEMA | Freq: Once | RECTAL | Status: AC | PRN
  Filled 2013-01-13: qty 1

## 2013-01-13 MED ORDER — OXYCODONE HCL 5 MG/5ML PO SOLN: 5.0000 mg | Freq: Once | ORAL | Status: DC | PRN

## 2013-01-13 MED ORDER — LACTATED RINGERS IV SOLN
INTRAVENOUS | Status: DC | PRN
  Administered 2013-01-13 (×2): via INTRAVENOUS

## 2013-01-13 MED ORDER — THROMBIN 20000 UNITS EX SOLR
CUTANEOUS | Status: DC | PRN
  Administered 2013-01-13: 16:00:00 via TOPICAL

## 2013-01-13 MED ORDER — SODIUM CHLORIDE 0.9 % IJ SOLN
3.0000 mL | Freq: Two times a day (BID) | INTRAMUSCULAR | Status: DC
  Administered 2013-01-13 – 2013-01-17 (×6): 3 mL via INTRAVENOUS

## 2013-01-13 MED ORDER — MIDAZOLAM HCL 5 MG/5ML IJ SOLN
INTRAMUSCULAR | Status: DC | PRN
  Administered 2013-01-13: 1 mg via INTRAVENOUS

## 2013-01-13 MED ORDER — ACETAMINOPHEN 650 MG RE SUPP: 650.0000 mg | RECTAL | Status: DC | PRN

## 2013-01-13 MED ORDER — LIDOCAINE HCL (CARDIAC) 20 MG/ML IV SOLN
INTRAVENOUS | Status: DC | PRN
  Administered 2013-01-13: 70 mg via INTRAVENOUS

## 2013-01-13 MED ORDER — PHENAZOPYRIDINE HCL 200 MG PO TABS
200.0000 mg | ORAL_TABLET | Freq: Three times a day (TID) | ORAL | Status: DC | PRN
  Administered 2013-01-16: 200 mg via ORAL
  Filled 2013-01-13 (×2): qty 1

## 2013-01-13 MED ORDER — HEMOSTATIC AGENTS (NO CHARGE) OPTIME
TOPICAL | Status: DC | PRN
  Administered 2013-01-13: 1 via TOPICAL

## 2013-01-13 MED ORDER — HYDROMORPHONE HCL PF 1 MG/ML IJ SOLN
0.5000 mg | INTRAMUSCULAR | Status: DC | PRN
  Administered 2013-01-13 – 2013-01-14 (×3): 1 mg via INTRAVENOUS
  Filled 2013-01-13 (×3): qty 1

## 2013-01-13 MED ORDER — FENTANYL CITRATE 0.05 MG/ML IJ SOLN
INTRAMUSCULAR | Status: DC | PRN
  Administered 2013-01-13: 50 ug via INTRAVENOUS
  Administered 2013-01-13: 150 ug via INTRAVENOUS
  Administered 2013-01-13: 50 ug via INTRAVENOUS

## 2013-01-13 MED ORDER — SODIUM CHLORIDE 0.9 % IR SOLN
Status: DC | PRN
  Administered 2013-01-13: 15:00:00

## 2013-01-13 MED ORDER — ACETAMINOPHEN 325 MG PO TABS: 650.0000 mg | ORAL_TABLET | ORAL | Status: DC | PRN

## 2013-01-13 MED ORDER — GLYCOPYRROLATE 0.2 MG/ML IJ SOLN
INTRAMUSCULAR | Status: DC | PRN
  Administered 2013-01-13: 0.4 mg via INTRAVENOUS

## 2013-01-13 MED ORDER — ONDANSETRON HCL 4 MG/2ML IJ SOLN: 4.0000 mg | Freq: Four times a day (QID) | INTRAMUSCULAR | Status: DC | PRN

## 2013-01-13 MED ORDER — PROPOFOL 10 MG/ML IV BOLUS
INTRAVENOUS | Status: DC | PRN
  Administered 2013-01-13: 150 mg via INTRAVENOUS

## 2013-01-13 MED ORDER — POLYETHYLENE GLYCOL 3350 17 G PO PACK
17.0000 g | PACK | Freq: Every day | ORAL | Status: DC | PRN
  Filled 2013-01-13: qty 1

## 2013-01-13 MED ORDER — BIOTENE DRY MOUTH MT LIQD: 15.0000 mL | Freq: Two times a day (BID) | OROMUCOSAL | Status: DC

## 2013-01-13 MED ORDER — THROMBIN 5000 UNITS EX SOLR
CUTANEOUS | Status: DC | PRN
  Administered 2013-01-13 (×2): 5000 [IU] via TOPICAL

## 2013-01-13 MED ORDER — SODIUM CHLORIDE 0.9 % IJ SOLN: 3.0000 mL | INTRAMUSCULAR | Status: DC | PRN

## 2013-01-13 MED ORDER — KETOROLAC TROMETHAMINE 30 MG/ML IJ SOLN
INTRAMUSCULAR | Status: AC
  Filled 2013-01-13: qty 1

## 2013-01-13 MED ORDER — NEOSTIGMINE METHYLSULFATE 1 MG/ML IJ SOLN
INTRAMUSCULAR | Status: DC | PRN
  Administered 2013-01-13: 3 mg via INTRAVENOUS

## 2013-01-13 SURGICAL SUPPLY — 60 items
ACCUDRAIN EXTERNAL CSF DRAINAGE SYSTEM ×1 IMPLANT
ADH SKN CLS APL DERMABOND .7 (GAUZE/BANDAGES/DRESSINGS) ×1
APL SKNCLS STERI-STRIP NONHPOA (GAUZE/BANDAGES/DRESSINGS) ×1
APL SRG 60D 8 XTD TIP BNDBL (TIP) ×1
BAG DECANTER FOR FLEXI CONT (MISCELLANEOUS) ×2 IMPLANT
BENZOIN TINCTURE PRP APPL 2/3 (GAUZE/BANDAGES/DRESSINGS) ×2 IMPLANT
BLADE SURG ROTATE 9660 (MISCELLANEOUS) IMPLANT
BRUSH SCRUB EZ PLAIN DRY (MISCELLANEOUS) ×2 IMPLANT
CANISTER SUCTION 2500CC (MISCELLANEOUS) ×2 IMPLANT
CLOTH BEACON ORANGE TIMEOUT ST (SAFETY) ×2 IMPLANT
CONT SPEC 4OZ CLIKSEAL STRL BL (MISCELLANEOUS) ×2 IMPLANT
DERMABOND ADVANCED (GAUZE/BANDAGES/DRESSINGS) ×1
DERMABOND ADVANCED .7 DNX12 (GAUZE/BANDAGES/DRESSINGS) ×1 IMPLANT
DRAPE C-ARM 42X72 X-RAY (DRAPES) ×2 IMPLANT
DRAPE LAPAROTOMY 100X72X124 (DRAPES) ×2 IMPLANT
DRAPE MICROSCOPE LEICA (MISCELLANEOUS) ×1 IMPLANT
DRAPE POUCH INSTRU U-SHP 10X18 (DRAPES) ×2 IMPLANT
DRAPE SURG 17X23 STRL (DRAPES) ×2 IMPLANT
DRILL BIT 3 FLUTED 4.0MM (BIT) ×1 IMPLANT
DRSG OPSITE POSTOP 4X8 (GAUZE/BANDAGES/DRESSINGS) ×1 IMPLANT
DURAPREP 26ML APPLICATOR (WOUND CARE) ×1 IMPLANT
DURASEAL APPLICATOR TIP (TIP) ×1 IMPLANT
DURASEAL SPINE SEALANT 3ML (MISCELLANEOUS) ×1 IMPLANT
ELECT REM PT RETURN 9FT ADLT (ELECTROSURGICAL) ×2
ELECTRODE REM PT RTRN 9FT ADLT (ELECTROSURGICAL) ×1 IMPLANT
EVACUATOR 1/8 PVC DRAIN (DRAIN) IMPLANT
GAUZE SPONGE 4X4 16PLY XRAY LF (GAUZE/BANDAGES/DRESSINGS) IMPLANT
GLOVE ECLIPSE 7.5 STRL STRAW (GLOVE) ×2 IMPLANT
GLOVE ECLIPSE 8.5 STRL (GLOVE) ×3 IMPLANT
GLOVE EXAM NITRILE LRG STRL (GLOVE) IMPLANT
GLOVE EXAM NITRILE MD LF STRL (GLOVE) ×1 IMPLANT
GLOVE EXAM NITRILE XL STR (GLOVE) ×1 IMPLANT
GLOVE EXAM NITRILE XS STR PU (GLOVE) IMPLANT
GLOVE INDICATOR 8.0 STRL GRN (GLOVE) ×2 IMPLANT
GOWN BRE IMP SLV AUR LG STRL (GOWN DISPOSABLE) IMPLANT
GOWN BRE IMP SLV AUR XL STRL (GOWN DISPOSABLE) ×3 IMPLANT
GOWN STRL REIN 2XL LVL4 (GOWN DISPOSABLE) ×2 IMPLANT
HERMETIC LUMBAR CATHETER CLOSED TIP ×1 IMPLANT
KIT BASIN OR (CUSTOM PROCEDURE TRAY) ×2 IMPLANT
KIT DRAIN CSF ACCUDRAIN (MISCELLANEOUS) ×2 IMPLANT
KIT ROOM TURNOVER OR (KITS) ×2 IMPLANT
NDL HYPO 25X1 1.5 SAFETY (NEEDLE) IMPLANT
NEEDLE HYPO 25X1 1.5 SAFETY (NEEDLE) IMPLANT
NS IRRIG 1000ML POUR BTL (IV SOLUTION) ×2 IMPLANT
PACK LAMINECTOMY NEURO (CUSTOM PROCEDURE TRAY) ×2 IMPLANT
RUBBERBAND STERILE (MISCELLANEOUS) ×2 IMPLANT
SPONGE GAUZE 4X4 12PLY (GAUZE/BANDAGES/DRESSINGS) ×2 IMPLANT
SPONGE SURGIFOAM ABS GEL SZ50 (HEMOSTASIS) ×2 IMPLANT
STRIP CLOSURE SKIN 1/2X4 (GAUZE/BANDAGES/DRESSINGS) ×2 IMPLANT
SUT ETHILON 3 0 FSL (SUTURE) ×2 IMPLANT
SUT PROLENE 6 0 BV (SUTURE) ×1 IMPLANT
SUT VIC AB 0 CT1 18XCR BRD8 (SUTURE) ×1 IMPLANT
SUT VIC AB 0 CT1 8-18 (SUTURE) ×2
SUT VIC AB 2-0 CT1 18 (SUTURE) ×2 IMPLANT
SWAB CULTURE LIQ STUART DBL (MISCELLANEOUS) ×2 IMPLANT
SYR 20ML ECCENTRIC (SYRINGE) ×2 IMPLANT
TOWEL OR 17X24 6PK STRL BLUE (TOWEL DISPOSABLE) ×2 IMPLANT
TOWEL OR 17X26 10 PK STRL BLUE (TOWEL DISPOSABLE) ×2 IMPLANT
TUBE ANAEROBIC SPECIMEN COL (MISCELLANEOUS) ×2 IMPLANT
WATER STERILE IRR 1000ML POUR (IV SOLUTION) ×2 IMPLANT

## 2013-01-13 NOTE — Transfer of Care (Signed)
Immediate Anesthesia Transfer of Care Note  Patient: Harry Young  Procedure(s) Performed: Procedure(s) with comments: LUMBAR WOUND DEBRIDEMENT (N/A) - Lumbar Wound Exploration,  Attempted Placement of Cerebrospinal Drain, and Repair of CSF Leak  Patient Location: PACU  Anesthesia Type:General  Level of Consciousness: awake, alert , oriented and patient cooperative  Airway & Oxygen Therapy: Patient Spontanous Breathing and Patient connected to nasal cannula oxygen  Post-op Assessment: Report given to PACU RN, Post -op Vital signs reviewed and stable and Patient moving all extremities  Post vital signs: Reviewed and stable  Complications: No apparent anesthesia complications

## 2013-01-13 NOTE — Progress Notes (Signed)
Pt to Neuro OR holding area via bed accompanied by family, wife and mom.  Family shown the waiting area.  Pt to be transferred to 4N12 after surgery/PACU.

## 2013-01-13 NOTE — Anesthesia Preprocedure Evaluation (Addendum)
Anesthesia Evaluation  Patient identified by MRN, date of birth, ID band Patient awake    Reviewed: Allergy & Precautions, H&P , NPO status , Patient's Chart, lab work & pertinent test results  History of Anesthesia Complications Negative for: history of anesthetic complications  Airway Mallampati: II TM Distance: >3 FB Neck ROM: Full    Dental  (+) Teeth Intact and Dental Advisory Given   Pulmonary COPDCurrent Smoker,          Cardiovascular     Neuro/Psych    GI/Hepatic GERD-  ,  Endo/Other    Renal/GU      Musculoskeletal   Abdominal   Peds  Hematology   Anesthesia Other Findings   Reproductive/Obstetrics                           Anesthesia Physical Anesthesia Plan  ASA: III  Anesthesia Plan: General   Post-op Pain Management:    Induction: Intravenous  Airway Management Planned: Oral ETT  Additional Equipment:   Intra-op Plan:   Post-operative Plan: Extubation in OR  Informed Consent: I have reviewed the patients History and Physical, chart, labs and discussed the procedure including the risks, benefits and alternatives for the proposed anesthesia with the patient or authorized representative who has indicated his/her understanding and acceptance.   Dental advisory given  Plan Discussed with: CRNA, Anesthesiologist and Surgeon  Anesthesia Plan Comments:        Anesthesia Quick Evaluation

## 2013-01-13 NOTE — Anesthesia Postprocedure Evaluation (Signed)
Anesthesia Post Note  Patient: Harry Young  Procedure(s) Performed: Procedure(s) (LRB): LUMBAR WOUND DEBRIDEMENT (N/A)  Anesthesia type: General  Patient location: PACU  Post pain: Pain level controlled and Adequate analgesia  Post assessment: Post-op Vital signs reviewed, Patient's Cardiovascular Status Stable, Respiratory Function Stable, Patent Airway and Pain level controlled  Last Vitals:  Filed Vitals:   01/13/13 1756  BP: 113/56  Pulse: 71  Temp: 36.5 C  Resp: 16    Post vital signs: Reviewed and stable  Level of consciousness: awake, alert  and oriented  Complications: No apparent anesthesia complications

## 2013-01-13 NOTE — Anesthesia Procedure Notes (Signed)
Procedure Name: Intubation Date/Time: 01/13/2013 2:50 PM Performed by: Gayla Medicus Pre-anesthesia Checklist: Patient identified, Timeout performed, Emergency Drugs available, Suction available and Patient being monitored Patient Re-evaluated:Patient Re-evaluated prior to inductionOxygen Delivery Method: Circle system utilized Preoxygenation: Pre-oxygenation with 100% oxygen Intubation Type: IV induction Ventilation: Mask ventilation without difficulty and Oral airway inserted - appropriate to patient size Laryngoscope Size: Mac and 4 Grade View: Grade II Tube type: Oral Tube size: 7.5 mm Number of attempts: 1 Airway Equipment and Method: Stylet Placement Confirmation: ETT inserted through vocal cords under direct vision,  positive ETCO2 and breath sounds checked- equal and bilateral Secured at: 24 cm Tube secured with: Tape Dental Injury: Teeth and Oropharynx as per pre-operative assessment

## 2013-01-13 NOTE — Brief Op Note (Signed)
01/12/2013 - 01/13/2013  5:03 PM  PATIENT:  Patrica Duel  57 y.o. male  PRE-OPERATIVE DIAGNOSIS:  Lumbar Wound Exploration  POST-OPERATIVE DIAGNOSIS:  Cerebrospinal Leak   PROCEDURE:  Procedure(s) with comments: LUMBAR WOUND DEBRIDEMENT (N/A) - Lumbar Wound Exploration,  Attempted Placement of Cerebrospinal Drain, and Repair of CSF Leak  SURGEON:  Surgeon(s) and Role:    * Temple Pacini, MD - Primary  PHYSICIAN ASSISTANT:   ASSISTANTS: none   ANESTHESIA:   general  EBL:  Total I/O In: 1000 [I.V.:1000] Out: 1150 [Urine:1000; Blood:150]  BLOOD ADMINISTERED:none  DRAINS: none   LOCAL MEDICATIONS USED:  NONE  SPECIMEN:  No Specimen  DISPOSITION OF SPECIMEN:  N/A  COUNTS:  YES  TOURNIQUET:  * No tourniquets in log *  DICTATION: .Dragon Dictation  PLAN OF CARE: Admit to inpatient   PATIENT DISPOSITION:  PACU - hemodynamically stable.   Delay start of Pharmacological VTE agent (>24hrs) due to surgical blood loss or risk of bleeding: yes

## 2013-01-13 NOTE — Preoperative (Signed)
Beta Blockers   Reason not to administer Beta Blockers:Not Applicable 

## 2013-01-13 NOTE — Progress Notes (Signed)
The patient complains of headache this morning. He continues to have intermittent drainage of clear fluid consistent with CSF. On exam he is awake and alert he is oriented and appropriate. Neurologically he is intact. His wound is now visibly draining a small amount of fluid.  I discussed situation with Dr.Cabbell and we're degree met that the first line of management would be to try to place a lumbar drain. I discussed situation with the patient and his wife. They agree to proceed. I will proceed later at the bedside.

## 2013-01-13 NOTE — Op Note (Signed)
Date of procedure: 01/13/2013  Date of dictation: Same  Service: Neurosurgery  Preoperative diagnosis: Postoperative CSF leak  Postoperative diagnosis: Same  Procedure Name: Attempted placement of lumbar drain  Surgeon:Makenna Macaluso A.Jasiyah Poland, M.D.  Asst. Surgeon: None  Anesthesia: General  Indication: Persistent postoperative CSF leak  Operative note: Patient was placed in the left lateral decubitus position. His back was prepped and draped. Local anesthesia was infiltrated. I attempted to pass a 14-gauge Tew needle into the interlaminar space. This was met with great resistance and pain on the patient's part. I was unable to successfully inter-the lumbar subarachnoid space. The procedure was aborted secondary to patient discomfort. I discussed situation the patient and we decided to proceed to the operating room and do this under general anesthesia with a more formal wound exploration as well.

## 2013-01-13 NOTE — Op Note (Signed)
Date of procedure: 01/13/2013  Date of dictation: Same  Service: Neurosurgery  Preoperative diagnosis: Postoperative cerebrospinal fluid fistula  Postoperative diagnosis: Same  Procedure Name:  reexploration of laminotomy with repair of dural laceration, microdissection  Attempted placement of lumbar drain catheter  Surgeon:Jahrel Borthwick A.Shikita Vaillancourt, M.D.  Asst. Surgeon: None  Anesthesia: General  Indication: 57 year old male status post left L4-5 extraforaminal microdiscectomy. Patient presents with persistent CSF drainage from the wound and severe postural headaches. Patient underwent unsuccessful bedside placement of lumbar drain. He presents now for reexploration of his wound and attempted placement of drain.  Operative note: After induction of anesthesia, patient positioned prone onto Wilson frame and appropriately padded. Lumbar region prepped and draped. Incision reopened. Retractor placed. Previous extraforaminal discectomy site was explored and found to be free of any CSF leakage. The L4-5 level was free of any CSF leakage there was however CSF leaking from a defect at L3-4. The laminotomy previously done at L3-4 was dissected free. The laminotomy was widened using high-speed drill. Ligament flavum was elevated and resected these will fashion as well as epidural scar. The dural laceration was identified and oversewn using a 6-0 Prolene utilizing the microscope for microdissection. This achieved watertight closure of the dural laceration. Numerous attempts were then made to place a lumbar subarachnoid catheter including attempts will using intraoperative fluoroscopy. Although the touhey needle was introduced into the spinal canal numerous times CSF could not be reliably returned and it attempts at passage of the catheter were unsuccessful. This part of the procedure with subsequent aborted. The wound was then irrigated with and bike solution. DuraSeal was placed over the dural repair. Wounds and  close in layers with Vicryl sutures and with a running 3-0 nylon at the skin. There were no apparent complications. Patient tolerated the procedure well and he returns to the recovery room postop.

## 2013-01-14 NOTE — Progress Notes (Signed)
Patient ID: Harry Young, male   DOB: Jun 29, 1955, 57 y.o.   MRN: [id number] BP 113/65  Pulse 68  Temp(Src) 98.8 F (37.1 C) (Oral)  Resp 18  Ht 5\' 10"  (1.778 m)  Wt 72.077 kg (158 lb 14.4 oz)  BMI 22.8 kg/m2  SpO2 95% Alert, wound is dry, no csf leaking Moving lower extremities well

## 2013-01-15 NOTE — Progress Notes (Signed)
Patient ID: Harry Young, male   DOB: October 29, 1955, 57 y.o.   MRN: [id number] BP 105/48  Pulse 65  Temp(Src) 98 F (36.7 C) (Oral)  Resp 18  Ht 5\' 10"  (1.778 m)  Wt 72.077 kg (158 lb 14.4 oz)  BMI 22.8 kg/m2  SpO2 97% Alert and oriented x 4 Speech is clear and fluent Moving lower extremities well Wound is clean, flat, and dry, no signs of infection

## 2013-01-15 NOTE — Progress Notes (Addendum)
ANTIBIOTIC CONSULT NOTE   Pharmacy Consult for vancomycin Indication: CSF leak Allergies  Allergen Reactions  . Sulfa Antibiotics Rash    Patient Measurements: Height: 5\' 10"  (177.8 cm) Weight: 158 lb 14.4 oz (72.077 kg) IBW/kg (Calculated) : 73  Vital Signs: Temp: 98.2 F (36.8 C) (09/03 0951) Temp src: Oral (09/03 0951) BP: 112/51 mmHg (09/03 0951) Pulse Rate: 65 (09/03 0951)  Labs:  Recent Labs  01/12/13 2115 01/12/13 2121  WBC 12.7*  --   HGB 12.8* 12.9*  PLT 209  --   CREATININE  --  1.20   Estimated Creatinine Clearance: 68.4 ml/min (by C-G formula based on Cr of 1.2).   Assessment: 57yo male is 4d s/p lumbar laminotomy and microdiscectomy, c/o intermittent drainage of clear fluid from lumbar wound, per neurosurgery pt has probably postoperative stripper spinal fluid fistula w/ postural headache. S/p debridement and exploration. Unable to place lumbar catheter. Patient continues on day#3 of vancomycin. Last scr drawn was 1.2. Will plan on checking scr and trough level on Friday 9/5 if therapy is to continue.  Goal of Therapy:  Vancomycin trough level 15-20 mcg/ml  Plan:  Continue vancomycin 1000mg  IV Q12H Scr and vancomycin trough 9/5 if therapy continues  Sheppard Coil PharmD., BCPS Clinical Pharmacist Pager [phone] 01/15/2013 11:01 AM

## 2013-01-16 NOTE — Progress Notes (Signed)
Pt HOB elevated 30 degrees.  Pt tolerated well.  No complaints

## 2013-01-16 NOTE — Progress Notes (Signed)
Patient ID: Harry Young, male   DOB: July 16, 1955, 57 y.o.   MRN: [id number] BP 102/50  Pulse 65  Temp(Src) 97.9 F (36.6 C) (Oral)  Resp 20  Ht 5\' 10"  (1.778 m)  Wt 72.077 kg (158 lb 14.4 oz)  BMI 22.8 kg/m2  SpO2 97% Alert and oriented x 4 Moving all extremities well Dressing blood stained, dry Possible discharge tomorrow.

## 2013-01-17 LAB — CREATININE, SERUM
Creatinine, Ser: 1.08 mg/dL (ref 0.50–1.35)
GFR calc Af Amer: 86 mL/min — ABNORMAL LOW (ref >90)
GFR calc non Af Amer: 74 mL/min — ABNORMAL LOW (ref >90)

## 2013-01-17 LAB — VANCOMYCIN, TROUGH: Vancomycin Tr: 22.2 ug/mL — ABNORMAL HIGH (ref 10.0–20.0)

## 2013-01-17 MED ORDER — VANCOMYCIN HCL IN DEXTROSE 750-5 MG/150ML-% IV SOLN
750.0000 mg | Freq: Two times a day (BID) | INTRAVENOUS | Status: DC
  Filled 2013-01-17: qty 150

## 2013-01-17 MED ORDER — HYDROCODONE-ACETAMINOPHEN 5-325 MG PO TABS: 1.0000 | ORAL_TABLET | Freq: Four times a day (QID) | ORAL | Status: DC | PRN

## 2013-01-17 NOTE — Discharge Summary (Signed)
  Admitting dx: post operative csf leak Discharge dx: same Proc: primary repair lumbar csf leak Comp: none Discharge destination: home Status alive and will  Normal neurologic exam.  Wound is clean, dry, and flat.  meds hydrocodone

## 2013-01-17 NOTE — Progress Notes (Signed)
Dressing half soaked with serousanguinous fluid and some noted on bed pad, dressing changed and noted minimal drainage on the bottom of incision. No c/o noted from patient. Will continue to monitor.

## 2013-01-17 NOTE — Progress Notes (Signed)
ANTIBIOTIC CONSULT NOTE   Pharmacy Consult for vancomycin Indication: CSF leak Allergies  Allergen Reactions  . Sulfa Antibiotics Rash    Patient Measurements: Height: 5\' 10"  (177.8 cm) Weight: 158 lb 14.4 oz (72.077 kg) IBW/kg (Calculated) : 73  Vital Signs: Temp: 98.1 F (36.7 C) (09/05 0923) Temp src: Oral (09/05 0923) BP: 98/61 mmHg (09/05 0923) Pulse Rate: 61 (09/05 0923)  Labs:  Recent Labs  01/17/13 0924  CREATININE 1.08   Estimated Creatinine Clearance: 76 ml/min (by C-G formula based on Cr of 1.08).   Assessment: 57yo male is 4d s/p lumbar laminotomy and microdiscectomy, c/o intermittent drainage of clear fluid from lumbar wound, per neurosurgery pt has probably postoperative stripper spinal fluid fistula w/ postural headache. S/p debridement and exploration. Unable to place lumbar catheter. Patient continues on day#5 of vancomycin and rocephin. Scr 1.08, stable, vancomycin trough (22.2) slightly above goal, drawn appropriately. RN gave 1000 dose already, might discharge soon   Goal of Therapy:  Vancomycin trough level 15-20 mcg/ml  Plan:  Change vancomycin to 750 IV Q 12 hrs, next dose 2200 if patient stays. F/u length of therapy.  Bayard Hugger, PharmD, BCPS  Clinical Pharmacist  Pager: [phone]  01/17/2013 11:33 AM

## 2013-01-18 NOTE — Progress Notes (Signed)
Patient discharged with extra dressing supplies if needed. Instructions given to patient about wound care and precautions. The importance of calling surgeon if any drainage or Temp. noted.

## 2013-01-24 ENCOUNTER — Encounter (HOSPITAL_COMMUNITY): Payer: Self-pay | Admitting: Neurosurgery

## 2013-03-27 ENCOUNTER — Other Ambulatory Visit: Payer: Self-pay | Admitting: Urology

## 2013-04-01 ENCOUNTER — Encounter (HOSPITAL_BASED_OUTPATIENT_CLINIC_OR_DEPARTMENT_OTHER): Payer: Self-pay | Admitting: *Deleted

## 2013-04-01 NOTE — Progress Notes (Signed)
NPO AFTER MN. ARRIVE AT 1000. NEEDS HG. CURRENT CXR AND EKG IN EPIC AND CHART. MAY TAKE PAIN MED IF NEEDED W/ SIP OF WATER.

## 2013-04-03 NOTE — H&P (Signed)
Harry Young is a 57 year old male with a history of TCCa of the bladder   History of Present Illness Transitional cell carcinoma of the bladder: Presenting symptom - gross hematuria. CT scan 06/20/09 - 4 cm bladder tumor on the posterior right wall with no evidence of adenopathy or hydronephrosis.  TURBT 07/05/09 - tumor fully resected including partial resection of right ureteral orifice.   Pathology: TCCA noninvasive, moderate grade (Ta,G2/3)  BCG - 6 week induction course, full strength completed 5/11  Second TURBT 11/22/09 - posterior wall lesion and a second lesion at the anterior bladder neck region.   Pathology: Ta,G2/3  Second BCG - 6 week induction course completed 8/11.  Recurrence 8/14: He underwent TURBT with postoperative mitomycin C.   Pathology: Ta,G3    Interval history: He reports that since his surgery he has continued to have what he describes as bladder spasms and has been taking Pyridium with some relief. He said he seems to be improving somewhat but remains bothered by the symptoms. He has not seen any hematuria but he said he did pass something dark like a stone.   Past Medical History Problems  1. History of Asthma (493.90) 2. History of Cellulitis (682.9) 3. History of chronic bronchitis (V12.69) 4. History of kidney stones (V13.01) 5. Transitional cell carcinoma of bladder (188.9)  Surgical History Problems  1. History of Back Surgery 2. History of Bladder Injection Of Cancer Treatment 3. History of Cystoscopy Bladder Tumor (596.9) 4. History of Cystoscopy Bladder Tumor (596.9) 5. History of Cystoscopy With Fulguration Medium Lesion (2-5cm) 6. History of Spinal Diskectomy  Current Meds 1. Aleve TABS;  Therapy: (Recorded:11Nov2014) to Recorded 2. Hydrocodone-Acetaminophen 5-325 MG Oral Tablet;  Therapy: (Recorded:11Nov2014) to Recorded 3. Ibuprofen TABS;  Therapy: (Recorded:11Nov2014) to Recorded 4. Phenazopyridine HCl - 200 MG Oral Tablet; Take  one tab bid to tid prn burning on  urination;  Therapy: 01Aug2014 to (Evaluate:22Aug2014); Last Rx:12Aug2014 Ordered  Allergies Medication  1. Septra TABS  Family History Problems  1. Family history of Congestive Heart Failure 2. Family history of Death In The Family Father  Social History Problems  1. Denied: History of Alcohol Use 2. Caffeine Use 3. Marital History - Currently Married 4. Tobacco use (305.1)   1 ppd for 42 years  Review of Systems Genitourinary, constitutional, skin, eye, otolaryngeal, hematologic/lymphatic, cardiovascular, pulmonary, endocrine, musculoskeletal, gastrointestinal, neurological and psychiatric system(s) were reviewed and pertinent findings if present are noted.  Genitourinary: difficulty starting the urinary stream, post-void dribbling and hematuria.  Gastrointestinal: constipation.  ENT: sinus problems.  Respiratory: cough.  Musculoskeletal: back pain and joint pain.  Vitals Vital Signs   Blood Pressure: 116 / 57 Temperature: 97.8 F Heart Rate: 63  Physical Exam Constitutional: Well nourished and well developed . No acute distress.  ENT:. The ears and nose are normal in appearance.  Neck: The appearance of the neck is normal and no neck mass is present.  Pulmonary: No respiratory distress and normal respiratory rhythm and effort.  Cardiovascular: Heart rate and rhythm are normal . No peripheral edema.  Abdomen: The abdomen is soft and nontender. No masses are palpated. No CVA tenderness. No hernias are palpable. No hepatosplenomegaly noted.  Rectal: Rectal exam demonstrates normal sphincter tone, no tenderness and no masses. The prostate has no nodularity and is not tender. The left seminal vesicle is nonpalpable. The right seminal vesicle is nonpalpable. The perineum is normal on inspection.  Genitourinary: Examination of the penis demonstrates no discharge, no masses, no lesions  and a normal meatus. The penis is uncircumcised. The  scrotum is without lesions. The right epididymis is palpably normal and non-tender. The left epididymis is palpably normal and non-tender. The right testis is non-tender and without masses. The left testis is non-tender and without masses.  Lymphatics: The femoral and inguinal nodes are not enlarged or tender.  Skin: Normal skin turgor, no visible rash and no visible skin lesions.  Neuro/Psych:. Mood and affect are appropriate.  Assessment Assessed   A single papillary recurrence on the floor of the bladder just to the left of midline. What has been causing his irritative symptoms are several areas of dystrophic calcification that have developed at the site of his previous resections. We discussed the need to repeat his TURBT and also remove the areas of dystrophic calcification.    Plan He'll be scheduled for TURBT and removal bladder calcifications.

## 2013-04-04 ENCOUNTER — Encounter (HOSPITAL_BASED_OUTPATIENT_CLINIC_OR_DEPARTMENT_OTHER): Payer: 59 | Admitting: Anesthesiology

## 2013-04-04 ENCOUNTER — Encounter (HOSPITAL_BASED_OUTPATIENT_CLINIC_OR_DEPARTMENT_OTHER): Payer: Self-pay | Admitting: *Deleted

## 2013-04-04 ENCOUNTER — Ambulatory Visit (HOSPITAL_BASED_OUTPATIENT_CLINIC_OR_DEPARTMENT_OTHER)
Admission: RE | Admit: 2013-04-04 | Discharge: 2013-04-04 | Disposition: A | Discharge status: Home / Self Care | Payer: 59 | Source: Ambulatory Visit | Attending: Urology | Admitting: Urology

## 2013-04-04 ENCOUNTER — Encounter (HOSPITAL_BASED_OUTPATIENT_CLINIC_OR_DEPARTMENT_OTHER)
Admission: RE | Disposition: A | Discharge status: Home / Self Care | Payer: Self-pay | Source: Ambulatory Visit | Attending: Urology

## 2013-04-04 ENCOUNTER — Ambulatory Visit (HOSPITAL_BASED_OUTPATIENT_CLINIC_OR_DEPARTMENT_OTHER): Payer: 59 | Admitting: Anesthesiology

## 2013-04-04 DIAGNOSIS — F17200 Nicotine dependence, unspecified, uncomplicated: Secondary | ICD-10-CM | POA: Insufficient documentation

## 2013-04-04 DIAGNOSIS — C679 Malignant neoplasm of bladder, unspecified: Secondary | ICD-10-CM | POA: Insufficient documentation

## 2013-04-04 DIAGNOSIS — Z79899 Other long term (current) drug therapy: Secondary | ICD-10-CM | POA: Insufficient documentation

## 2013-04-04 DIAGNOSIS — J4489 Other specified chronic obstructive pulmonary disease: Secondary | ICD-10-CM | POA: Insufficient documentation

## 2013-04-04 DIAGNOSIS — K5900 Constipation, unspecified: Secondary | ICD-10-CM | POA: Insufficient documentation

## 2013-04-04 DIAGNOSIS — D494 Neoplasm of unspecified behavior of bladder: Secondary | ICD-10-CM

## 2013-04-04 DIAGNOSIS — Z87442 Personal history of urinary calculi: Secondary | ICD-10-CM | POA: Insufficient documentation

## 2013-04-04 DIAGNOSIS — J449 Chronic obstructive pulmonary disease, unspecified: Secondary | ICD-10-CM | POA: Insufficient documentation

## 2013-04-04 HISTORY — PX: TRANSURETHRAL RESECTION OF BLADDER TUMOR WITH GYRUS (TURBT-GYRUS): SHX6458

## 2013-04-04 HISTORY — DX: Malignant neoplasm of bladder, unspecified: C67.9

## 2013-04-04 HISTORY — PX: CYSTOSCOPY WITH LITHOLAPAXY: SHX1425

## 2013-04-04 HISTORY — DX: Calculus in bladder: N21.0

## 2013-04-04 HISTORY — DX: Personal history of urinary calculi: Z87.442

## 2013-04-04 HISTORY — DX: Urgency of urination: R39.15

## 2013-04-04 HISTORY — DX: Nocturia: R35.1

## 2013-04-04 LAB — POCT HEMOGLOBIN-HEMACUE: Hemoglobin: 14.6 g/dL (ref 13.0–17.0)

## 2013-04-04 SURGERY — TRANSURETHRAL RESECTION OF BLADDER TUMOR WITH GYRUS (TURBT-GYRUS)
Anesthesia: General | Site: Bladder | Wound class: Clean Contaminated

## 2013-04-04 MED ORDER — PROMETHAZINE HCL 25 MG/ML IJ SOLN
6.2500 mg | INTRAMUSCULAR | Status: DC | PRN
  Filled 2013-04-04: qty 1

## 2013-04-04 MED ORDER — LIDOCAINE HCL (CARDIAC) 20 MG/ML IV SOLN
INTRAVENOUS | Status: DC | PRN
  Administered 2013-04-04: 100 mg via INTRAVENOUS

## 2013-04-04 MED ORDER — CIPROFLOXACIN IN D5W 400 MG/200ML IV SOLN
400.0000 mg | INTRAVENOUS | Status: AC
  Administered 2013-04-04: 400 mg via INTRAVENOUS
  Filled 2013-04-04: qty 200

## 2013-04-04 MED ORDER — DEXAMETHASONE SODIUM PHOSPHATE 4 MG/ML IJ SOLN
INTRAMUSCULAR | Status: DC | PRN
  Administered 2013-04-04: 10 mg via INTRAVENOUS

## 2013-04-04 MED ORDER — FENTANYL CITRATE 0.05 MG/ML IJ SOLN
INTRAMUSCULAR | Status: DC | PRN
  Administered 2013-04-04: 25 ug via INTRAVENOUS
  Administered 2013-04-04 (×2): 12.5 ug via INTRAVENOUS
  Administered 2013-04-04 (×2): 25 ug via INTRAVENOUS
  Administered 2013-04-04 (×6): 12.5 ug via INTRAVENOUS
  Administered 2013-04-04: 25 ug via INTRAVENOUS

## 2013-04-04 MED ORDER — OXYBUTYNIN CHLORIDE 5 MG PO TABS
5.0000 mg | ORAL_TABLET | Freq: Once | ORAL | Status: AC
  Administered 2013-04-04: 5 mg via ORAL
  Filled 2013-04-04: qty 1

## 2013-04-04 MED ORDER — PROPOFOL 10 MG/ML IV BOLUS
INTRAVENOUS | Status: DC | PRN
  Administered 2013-04-04: 200 mg via INTRAVENOUS

## 2013-04-04 MED ORDER — TAMSULOSIN HCL 0.4 MG PO CAPS
0.4000 mg | ORAL_CAPSULE | Freq: Once | ORAL | Status: AC
  Administered 2013-04-04: 0.4 mg via ORAL
  Filled 2013-04-04: qty 1

## 2013-04-04 MED ORDER — FENTANYL CITRATE 0.05 MG/ML IJ SOLN
25.0000 ug | INTRAMUSCULAR | Status: DC | PRN
  Filled 2013-04-04: qty 1

## 2013-04-04 MED ORDER — LACTATED RINGERS IV SOLN
INTRAVENOUS | Status: DC
  Administered 2013-04-04 (×2): via INTRAVENOUS
  Filled 2013-04-04: qty 1000

## 2013-04-04 MED ORDER — KETOROLAC TROMETHAMINE 30 MG/ML IJ SOLN
INTRAMUSCULAR | Status: DC | PRN
  Administered 2013-04-04: 30 mg via INTRAVENOUS

## 2013-04-04 MED ORDER — TAMSULOSIN HCL 0.4 MG PO CAPS
ORAL_CAPSULE | ORAL | Status: AC
  Filled 2013-04-04: qty 1

## 2013-04-04 MED ORDER — MIDAZOLAM HCL 5 MG/5ML IJ SOLN
INTRAMUSCULAR | Status: DC | PRN
  Administered 2013-04-04 (×3): 1 mg via INTRAVENOUS

## 2013-04-04 MED ORDER — PHENAZOPYRIDINE HCL 100 MG PO TABS
ORAL_TABLET | ORAL | Status: AC
  Filled 2013-04-04: qty 2

## 2013-04-04 MED ORDER — HYDROCODONE-ACETAMINOPHEN 10-325 MG PO TABS: 1.0000 | ORAL_TABLET | ORAL | Status: DC | PRN

## 2013-04-04 MED ORDER — ONDANSETRON HCL 4 MG/2ML IJ SOLN
INTRAMUSCULAR | Status: DC | PRN
  Administered 2013-04-04: 4 mg via INTRAVENOUS

## 2013-04-04 MED ORDER — PHENAZOPYRIDINE HCL 200 MG PO TABS
200.0000 mg | ORAL_TABLET | Freq: Once | ORAL | Status: AC
  Administered 2013-04-04: 200 mg via ORAL
  Filled 2013-04-04: qty 1

## 2013-04-04 MED ORDER — OXYBUTYNIN CHLORIDE 5 MG PO TABS
ORAL_TABLET | ORAL | Status: AC
  Filled 2013-04-04: qty 1

## 2013-04-04 MED ORDER — SODIUM CHLORIDE 0.9 % IR SOLN
Status: DC | PRN
  Administered 2013-04-04: 12000 mL

## 2013-04-04 SURGICAL SUPPLY — 48 items
ADAPTER CATH URET PLST 4-6FR (CATHETERS) IMPLANT
ADPR CATH URET STRL DISP 4-6FR (CATHETERS)
BAG DRAIN URO-CYSTO SKYTR STRL (DRAIN) ×2 IMPLANT
BAG DRN ANRFLXCHMBR STRAP LEK (BAG)
BAG DRN UROCATH (DRAIN) ×1
BAG URINE DRAINAGE (UROLOGICAL SUPPLIES) IMPLANT
BAG URINE LEG 19OZ MD ST LTX (BAG) IMPLANT
CANISTER SUCT LVC 12 LTR MEDI- (MISCELLANEOUS) ×1 IMPLANT
CATH FOLEY 3WAY 30CC 24FR (CATHETERS) ×2
CATH HEMA 3WAY 30CC 24FR COUDE (CATHETERS) IMPLANT
CATH HEMA 3WAY 30CC 24FR RND (CATHETERS) IMPLANT
CATH INTERMIT  6FR 70CM (CATHETERS) IMPLANT
CATH URET 5FR 28IN CONE TIP (BALLOONS)
CATH URET 5FR 70CM CONE TIP (BALLOONS) IMPLANT
CATH URTH STD 24FR FL 3W 2 (CATHETERS) ×1 IMPLANT
CLOTH BEACON ORANGE TIMEOUT ST (SAFETY) ×2 IMPLANT
DRAPE CAMERA CLOSED 9X96 (DRAPES) ×2 IMPLANT
ELECT BUTTON BIOP 24F 90D PLAS (MISCELLANEOUS) IMPLANT
ELECT BUTTON HF 24-28F 2 30DE (ELECTRODE) IMPLANT
ELECT LOOP HF 26F 30D .35MM (CUTTING LOOP) IMPLANT
ELECT LOOP MED HF 24F 12D (CUTTING LOOP) IMPLANT
ELECT LOOP MED HF 24F 12D CBL (CLIP) ×1 IMPLANT
ELECT REM PT RETURN 9FT ADLT (ELECTROSURGICAL) ×2
ELECTRODE REM PT RTRN 9FT ADLT (ELECTROSURGICAL) ×1 IMPLANT
EVACUATOR MICROVAS BLADDER (UROLOGICAL SUPPLIES) ×1 IMPLANT
GLOVE BIO SURGEON STRL SZ 6 (GLOVE) ×1 IMPLANT
GLOVE BIO SURGEON STRL SZ8 (GLOVE) ×2 IMPLANT
GLOVE BIOGEL PI IND STRL 7.5 (GLOVE) IMPLANT
GLOVE BIOGEL PI INDICATOR 7.5 (GLOVE) ×2
GOWN PREVENTION PLUS LG XLONG (DISPOSABLE) ×2 IMPLANT
GOWN STRL REIN XL XLG (GOWN DISPOSABLE) ×2 IMPLANT
GOWN XL W/COTTON TOWEL STD (GOWNS) ×2 IMPLANT
GUIDEWIRE 0.038 PTFE COATED (WIRE) ×2 IMPLANT
GUIDEWIRE ANG ZIPWIRE 038X150 (WIRE) IMPLANT
GUIDEWIRE STR DUAL SENSOR (WIRE) IMPLANT
HOLDER FOLEY CATH W/STRAP (MISCELLANEOUS) IMPLANT
IV NS 1000ML (IV SOLUTION) ×12
IV NS 1000ML BAXH (IV SOLUTION) IMPLANT
IV NS IRRIG 3000ML ARTHROMATIC (IV SOLUTION) IMPLANT
KIT ASPIRATION TUBING (SET/KITS/TRAYS/PACK) IMPLANT
LOOP CUTTING 24FR OLYMPUS (CUTTING LOOP) IMPLANT
NS IRRIG 500ML POUR BTL (IV SOLUTION) ×1 IMPLANT
PACK CYSTOSCOPY (CUSTOM PROCEDURE TRAY) ×2 IMPLANT
PLUG CATH AND CAP STER (CATHETERS) IMPLANT
SET ASPIRATION TUBING (TUBING) ×2 IMPLANT
SYR 30ML LL (SYRINGE) IMPLANT
SYRINGE IRR TOOMEY STRL 70CC (SYRINGE) IMPLANT
WATER STERILE IRR 3000ML UROMA (IV SOLUTION) ×2 IMPLANT

## 2013-04-04 NOTE — Anesthesia Procedure Notes (Signed)
Procedure Name: LMA Insertion Date/Time: 04/04/2013 10:50 AM Performed by: Jessica Priest Pre-anesthesia Checklist: Patient identified, Emergency Drugs available, Suction available and Patient being monitored Patient Re-evaluated:Patient Re-evaluated prior to inductionOxygen Delivery Method: Circle System Utilized Preoxygenation: Pre-oxygenation with 100% oxygen Intubation Type: IV induction Ventilation: Mask ventilation without difficulty LMA: LMA inserted LMA Size: 4.0 Number of attempts: 1 Airway Equipment and Method: bite block Placement Confirmation: positive ETCO2 Tube secured with: Tape Dental Injury: Teeth and Oropharynx as per pre-operative assessment

## 2013-04-04 NOTE — Interval H&P Note (Signed)
History and Physical Interval Note:  04/04/2013 10:33 AM  Harry Young  has presented today for surgery, with the diagnosis of Bladder Tumor, Bladder Stones  The various methods of treatment have been discussed with the patient and family. After consideration of risks, benefits and other options for treatment, the patient has consented to  Procedure(s) with comments: TRANSURETHRAL RESECTION OF BLADDER TUMOR WITH GYRUS (TURBT-GYRUS) (N/A) - REMOVAL OF BLADDER STONES       CYSTOSCOPY WITH LITHOLAPAXY (N/A) as a surgical intervention .  The patient's history has been reviewed, patient examined, no change in status, stable for surgery.  I have reviewed the patient's chart and labs.  Questions were answered to the patient's satisfaction.     Garnett Farm

## 2013-04-04 NOTE — Anesthesia Postprocedure Evaluation (Signed)
  Anesthesia Post-op Note  Patient: Harry Young  Procedure(s) Performed: Procedure(s) (LRB): TRANSURETHRAL RESECTION OF BLADDER TUMOR WITH GYRUS (TURBT-GYRUS) (N/A) CYSTOSCOPY  (N/A)  Patient Location: PACU  Anesthesia Type: General  Level of Consciousness: awake and alert   Airway and Oxygen Therapy: Patient Spontanous Breathing  Post-op Pain: mild  Post-op Assessment: Post-op Vital signs reviewed, Patient's Cardiovascular Status Stable, Respiratory Function Stable, Patent Airway and No signs of Nausea or vomiting  Last Vitals:  Filed Vitals:   04/04/13 1245  BP: 118/58  Pulse: 55  Temp:   Resp: 11    Post-op Vital Signs: stable   Complications: No apparent anesthesia complications

## 2013-04-04 NOTE — Anesthesia Preprocedure Evaluation (Addendum)
Anesthesia Evaluation  Patient identified by MRN, date of birth, ID band Patient awake    Reviewed: Allergy & Precautions, H&P , NPO status , Patient's Chart, lab work & pertinent test results  Airway Mallampati: II TM Distance: >3 FB Neck ROM: Full    Dental no notable dental hx. (+) Chipped,    Pulmonary COPDCurrent Smoker,  CXR: COPD, no acute abnormality breath sounds clear to auscultation  Pulmonary exam normal       Cardiovascular Exercise Tolerance: Good negative cardio ROS  Rhythm:Regular Rate:Normal  ECG: normal 01-07-13   Neuro/Psych negative neurological ROS  negative psych ROS   GI/Hepatic negative GI ROS, Neg liver ROS,   Endo/Other  negative endocrine ROS  Renal/GU negative Renal ROS  negative genitourinary   Musculoskeletal negative musculoskeletal ROS (+)   Abdominal   Peds negative pediatric ROS (+)  Hematology negative hematology ROS (+)   Anesthesia Other Findings   Reproductive/Obstetrics negative OB ROS                          Anesthesia Physical Anesthesia Plan  ASA: II  Anesthesia Plan: General   Post-op Pain Management:    Induction: Intravenous  Airway Management Planned: LMA  Additional Equipment:   Intra-op Plan:   Post-operative Plan: Extubation in OR  Informed Consent: I have reviewed the patients History and Physical, chart, labs and discussed the procedure including the risks, benefits and alternatives for the proposed anesthesia with the patient or authorized representative who has indicated his/her understanding and acceptance.   Dental advisory given  Plan Discussed with: CRNA  Anesthesia Plan Comments: (LMA#4 12-13-12)        Anesthesia Quick Evaluation

## 2013-04-04 NOTE — Op Note (Signed)
PATIENT:  Harry Young  PRE-OPERATIVE DIAGNOSIS: 1. Bladder tumor 2. Extensive bladder wall dystrophic calcification  POST-OPERATIVE DIAGNOSIS: Same  PROCEDURE:  Procedure(s): 1. TRANSURETHRAL RESECTION OF BLADDER TUMOR (TURBT) (1cm.) 2. Resection/removal of dystrophic bladder wall calcifications  SURGEON:  Surgeon(s): Garnett Farm  ANESTHESIA:   General  EBL:  Minimal  DRAINS: None  SPECIMEN:  Source of Specimen:  1. Bladder tumor 2. Dystrophic calcifications  DISPOSITION OF SPECIMEN:  PATHOLOGY  Indication: Harry Young is a 57 year old male with a history of transitional cell carcinoma of the bladder. He has undergone 2 transurethral resections the most recent in 8/14 for Ta,G3 cancer. He underwent postoperative mitomycin-C. At that time he said his continued to have what he describes as bladder spasms and has been requiring Pyridium which does bring about some relief. He said it seems to be improving somewhat but he remains symptomatic. Cystoscopically I found the source of his irritation to the extensive bladder wall dystrophic calcification. In addition he had a single papillary tumor that was about a centimeter in size on the floor of the bladder in the midline. He is brought to the operating room for removal of his bladder tumor and dystrophic calcification.  Description of operation: The patient was taken to the operating room and administered general anesthesia. He was then placed on the table and moved to the dorsal lithotomy position after which his genitalia was sterilely prepped and draped. An official timeout was then performed.  The 38 French resectoscope with visual obturator was then introduced into the bladder and the obturator was removed. The resectoscope element with 12 lens was then inserted and the bladder was fully and systematically inspected. Ureteral orifices were noted to be in the normal anatomic positions. I noted extensive dystrophic calcification of  greatest area being on the anterior wall of the bladder just to the right of midline extending onto the lateral wall with several isolated spots on the lower right wall of the bladder left superior wall of the bladder and left inferior wall. In addition the bladder tumor was noted to be in the midline away from the trigone and papillary in configuration. No other worrisome papillary lesions were identified within the bladder.  I first began by resecting the bladder tumor. This was removed and sent to pathology. I then began resecting the dystrophic calcification from the wall of the bladder but found resection was somewhat difficult due to the calcified nature of the lesions. I switched to the cold cup biopsy forceps and used this to grasp the calcification and remove it from the wall of the bladder. I was able to remove all of the calcification throughout the bladder with this technique with some areas responding to resection only. I then fulgurated all of the locations where bleeding was noted and then used the Microvasive evacuator to remove all of the dystrophic calcification from the bladder. Reinspection of the bladder revealed no bleeding, perforation or dystrophic calcification remaining. I then drained the bladder and removed the resectoscope.  It appeared that the postoperative mitomycin was the cause for his dystrophic calcification as he had undergone previous resections without the use of mitomycin and without the development of dystrophic calcification. I told him preoperatively that because of this observation I was not going to instill postoperative mitomycin-C today.  PLAN OF CARE: Discharge to home after PACU  PATIENT DISPOSITION:  PACU - hemodynamically stable.

## 2013-04-04 NOTE — Transfer of Care (Signed)
Immediate Anesthesia Transfer of Care Note  Patient: Harry Young  Procedure(s) Performed: Procedure(s) (LRB): TRANSURETHRAL RESECTION OF BLADDER TUMOR WITH GYRUS (TURBT-GYRUS) (N/A) CYSTOSCOPY  (N/A)  Patient Location: PACU  Anesthesia Type: General  Level of Consciousness: awake, sedated, patient cooperative and responds to stimulation  Airway & Oxygen Therapy: Patient Spontanous Breathing and Patient connected to face mask oxygen  Post-op Assessment: Report given to PACU RN, Post -op Vital signs reviewed and stable and Patient moving all extremities  Post vital signs: Reviewed and stable  Complications: No apparent anesthesia complications

## 2013-04-07 ENCOUNTER — Encounter (HOSPITAL_BASED_OUTPATIENT_CLINIC_OR_DEPARTMENT_OTHER): Payer: Self-pay | Admitting: Urology

## 2013-04-14 DIAGNOSIS — M5417 Radiculopathy, lumbosacral region: Secondary | ICD-10-CM

## 2013-04-14 DIAGNOSIS — M5126 Other intervertebral disc displacement, lumbar region: Secondary | ICD-10-CM

## 2013-04-14 HISTORY — DX: Other intervertebral disc displacement, lumbar region: M51.26

## 2013-04-14 LAB — MISCELLANEOUS TEST: Miscellaneous Test: 81350

## 2013-10-21 DIAGNOSIS — M5416 Radiculopathy, lumbar region: Secondary | ICD-10-CM | POA: Insufficient documentation

## 2013-10-21 HISTORY — DX: Radiculopathy, lumbar region: M54.16

## 2014-06-24 DIAGNOSIS — M5417 Radiculopathy, lumbosacral region: Secondary | ICD-10-CM

## 2015-04-01 ENCOUNTER — Other Ambulatory Visit (HOSPITAL_COMMUNITY): Payer: Self-pay | Admitting: Physician Assistant

## 2015-04-01 ENCOUNTER — Ambulatory Visit (HOSPITAL_COMMUNITY)
Admission: RE | Admit: 2015-04-01 | Discharge: 2015-04-01 | Disposition: A | Discharge status: Home / Self Care | Payer: 59 | Source: Ambulatory Visit | Attending: Cardiology | Admitting: Cardiology

## 2015-04-01 DIAGNOSIS — R0989 Other specified symptoms and signs involving the circulatory and respiratory systems: Secondary | ICD-10-CM

## 2015-04-01 DIAGNOSIS — I6523 Occlusion and stenosis of bilateral carotid arteries: Secondary | ICD-10-CM | POA: Diagnosis not present

## 2015-04-06 ENCOUNTER — Telehealth: Payer: Self-pay | Admitting: Cardiovascular Disease

## 2015-04-06 NOTE — Telephone Encounter (Signed)
Recommendations given for carotid study, repeat study in 1 yr per physician report.

## 2015-05-19 DIAGNOSIS — M48061 Spinal stenosis, lumbar region without neurogenic claudication: Secondary | ICD-10-CM

## 2015-05-19 HISTORY — DX: Spinal stenosis, lumbar region without neurogenic claudication: M48.061

## 2015-06-07 IMAGING — CR DG HIP (WITH OR WITHOUT PELVIS) 2-3V*L*
3 series · 3 of 3 positions shown · non-contrast
Comparison: Coronal reconstructed imaged from a CT, 06/30/2009

CLINICAL DATA: Left hip pain

LEFT HIP - COMPLETE 2+ VIEW

[t pelvis a.p.]
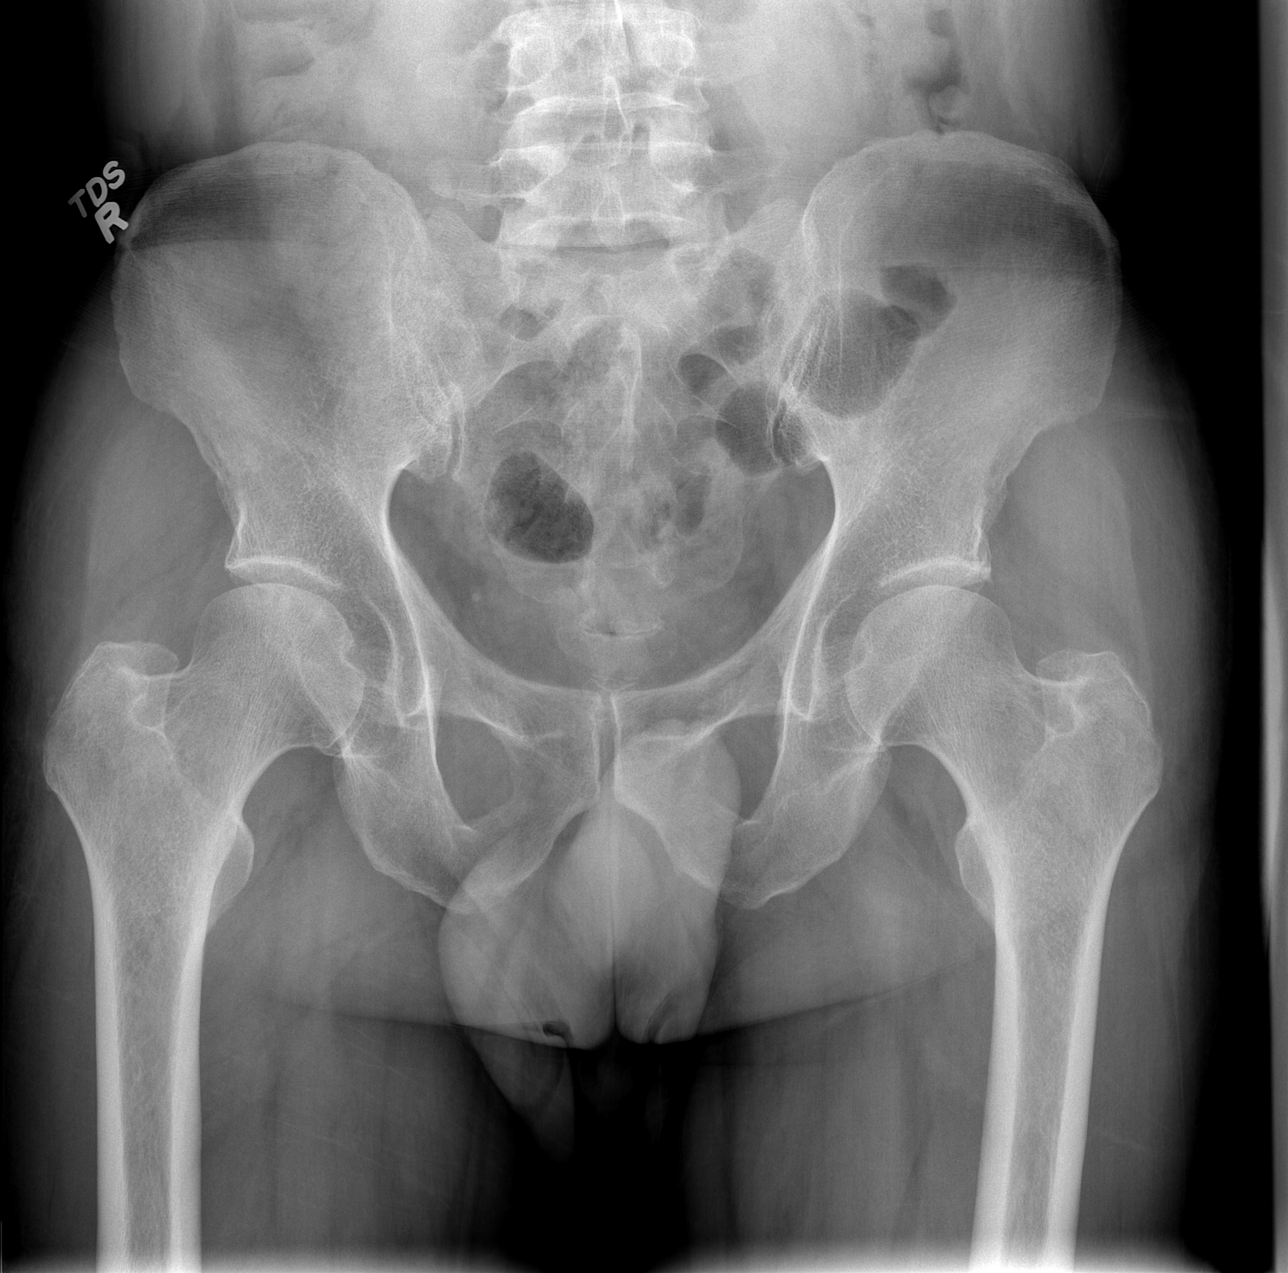

[t hip ap left]
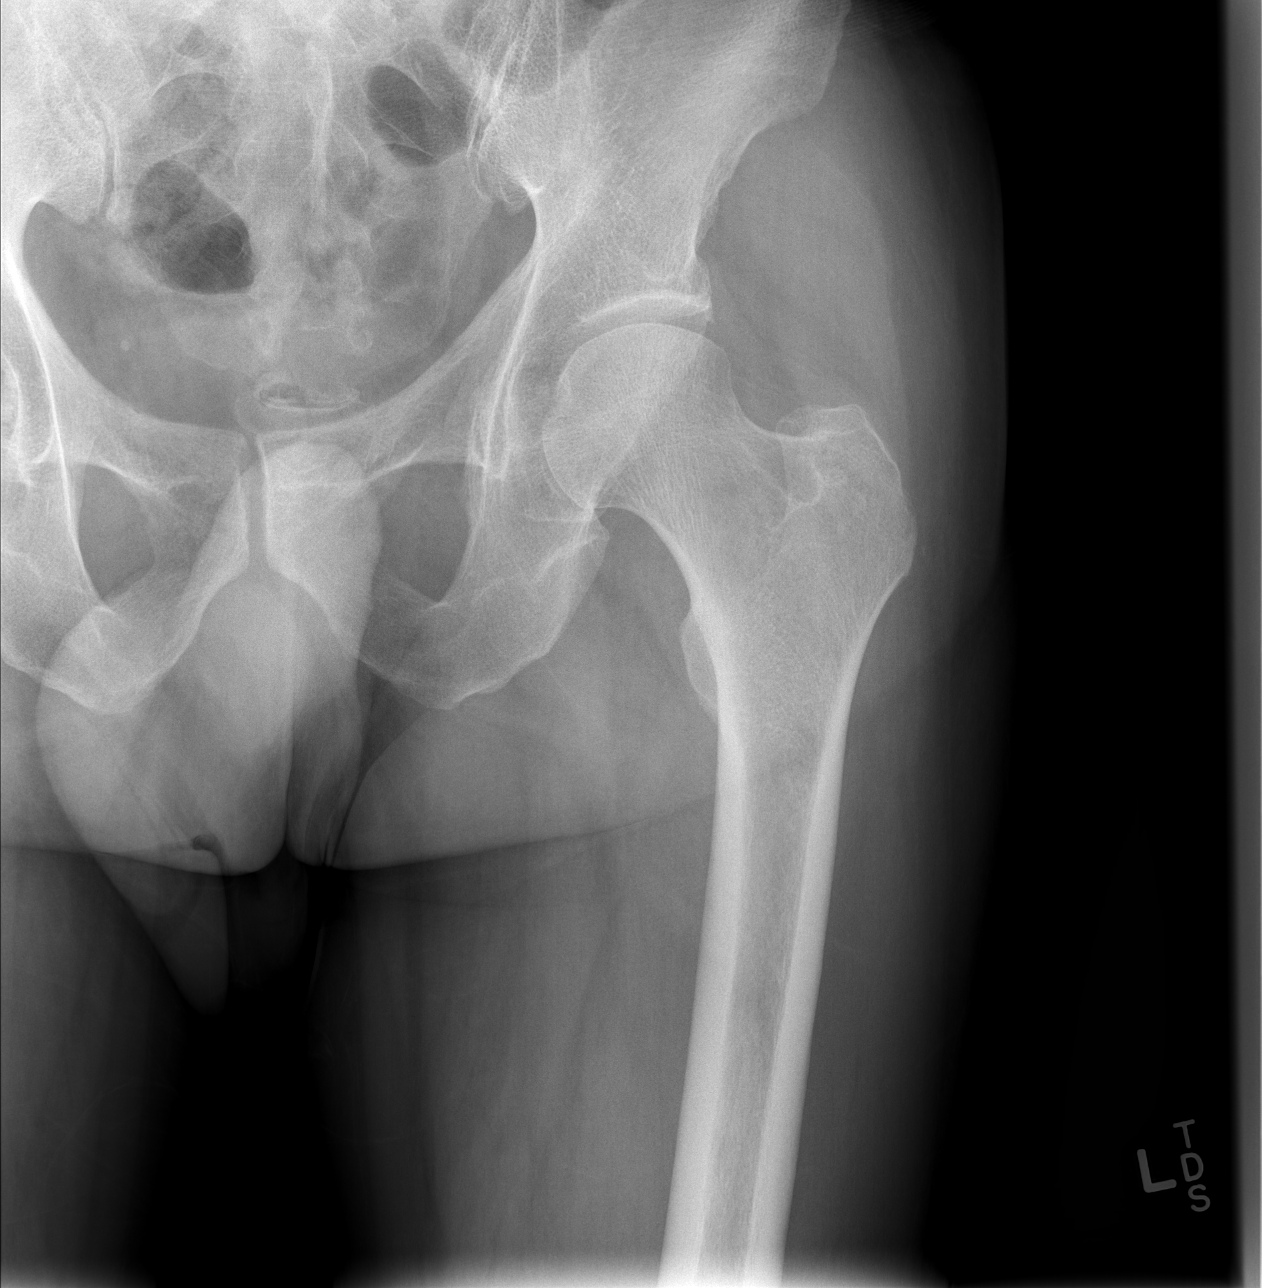

[t hip frog leg left]
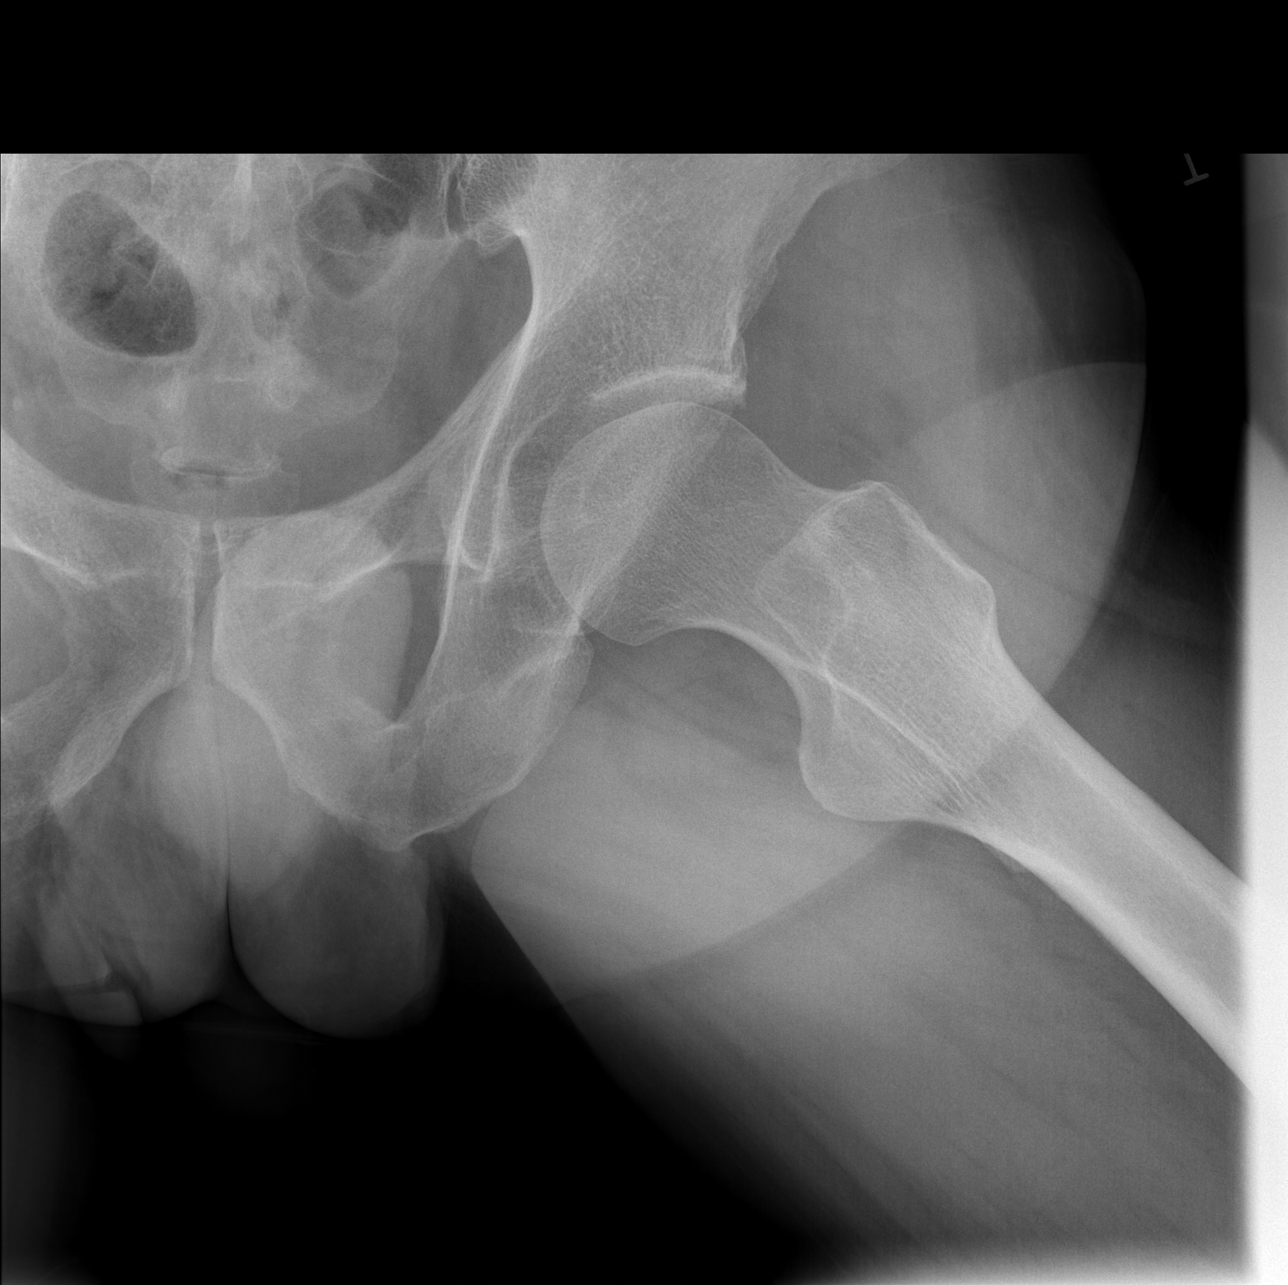

[3 of 3 positions shown; findings below may reference images not displayed]

FINDINGS: No fracture.  No bone lesion.  The hip joints are
normally spaced and aligned as are the SI joints and symphysis
pubis.  The soft tissues are unremarkable.
IMPRESSION: No fracture or joint abnormality.

## 2015-07-15 DIAGNOSIS — E063 Autoimmune thyroiditis: Secondary | ICD-10-CM

## 2015-07-15 HISTORY — DX: Autoimmune thyroiditis: E06.3

## 2015-08-07 DIAGNOSIS — K5900 Constipation, unspecified: Secondary | ICD-10-CM | POA: Insufficient documentation

## 2015-08-07 DIAGNOSIS — D494 Neoplasm of unspecified behavior of bladder: Secondary | ICD-10-CM

## 2015-08-07 DIAGNOSIS — R350 Frequency of micturition: Secondary | ICD-10-CM | POA: Insufficient documentation

## 2015-08-07 DIAGNOSIS — E785 Hyperlipidemia, unspecified: Secondary | ICD-10-CM | POA: Insufficient documentation

## 2015-08-07 DIAGNOSIS — R29898 Other symptoms and signs involving the musculoskeletal system: Secondary | ICD-10-CM

## 2015-08-07 DIAGNOSIS — I6523 Occlusion and stenosis of bilateral carotid arteries: Secondary | ICD-10-CM

## 2015-08-07 DIAGNOSIS — Z1211 Encounter for screening for malignant neoplasm of colon: Secondary | ICD-10-CM

## 2015-08-07 DIAGNOSIS — M2540 Effusion, unspecified joint: Secondary | ICD-10-CM

## 2015-08-07 HISTORY — DX: Occlusion and stenosis of bilateral carotid arteries: I65.23

## 2015-08-07 HISTORY — DX: Constipation, unspecified: K59.00

## 2015-08-07 HISTORY — DX: Other symptoms and signs involving the musculoskeletal system: R29.898

## 2016-03-30 DIAGNOSIS — M7701 Medial epicondylitis, right elbow: Secondary | ICD-10-CM

## 2016-03-30 HISTORY — DX: Medial epicondylitis, right elbow: M77.01

## 2016-04-13 ENCOUNTER — Other Ambulatory Visit: Payer: Self-pay | Admitting: Cardiovascular Disease

## 2016-04-13 DIAGNOSIS — R0989 Other specified symptoms and signs involving the circulatory and respiratory systems: Secondary | ICD-10-CM

## 2016-04-14 ENCOUNTER — Ambulatory Visit (HOSPITAL_COMMUNITY)
Admission: RE | Admit: 2016-04-14 | Discharge: 2016-04-14 | Disposition: A | Discharge status: Home / Self Care | Payer: 59 | Source: Ambulatory Visit | Attending: Internal Medicine | Admitting: Internal Medicine

## 2016-04-14 DIAGNOSIS — R0989 Other specified symptoms and signs involving the circulatory and respiratory systems: Secondary | ICD-10-CM

## 2016-04-14 DIAGNOSIS — I6523 Occlusion and stenosis of bilateral carotid arteries: Secondary | ICD-10-CM | POA: Diagnosis not present

## 2016-04-17 ENCOUNTER — Telehealth: Payer: Self-pay | Admitting: Cardiovascular Disease

## 2016-04-17 DIAGNOSIS — R0989 Other specified symptoms and signs involving the circulatory and respiratory systems: Secondary | ICD-10-CM

## 2016-04-17 NOTE — Telephone Encounter (Signed)
-----   Message from Lorretta Harp, MD sent at 04/15/2016  1:42 AM EST ----- No change from prior study. Repeat in 12 months.

## 2016-04-17 NOTE — Telephone Encounter (Signed)
Results given to pt. Pt verbalized understanding. Carotid doppler ordered to repeat in 12 months.

## 2017-12-21 ENCOUNTER — Telehealth (HOSPITAL_COMMUNITY): Payer: Self-pay | Admitting: Surgery

## 2017-12-21 NOTE — Telephone Encounter (Signed)
Carotid duplex order rec'd from [facility]- a voicemail was left at [phone] asking the patient to call Rip Harbour at [facility] to arrange an appt.

## 2018-01-03 DIAGNOSIS — IMO0001 Reserved for inherently not codable concepts without codable children: Secondary | ICD-10-CM | POA: Insufficient documentation

## 2018-01-03 DIAGNOSIS — F17200 Nicotine dependence, unspecified, uncomplicated: Secondary | ICD-10-CM

## 2018-01-03 HISTORY — DX: Reserved for inherently not codable concepts without codable children: IMO0001

## 2018-01-04 ENCOUNTER — Ambulatory Visit (HOSPITAL_COMMUNITY)
Admission: RE | Admit: 2018-01-04 | Discharge: 2018-01-04 | Disposition: A | Discharge status: Home / Self Care | Payer: 59 | Source: Ambulatory Visit | Attending: Family Medicine | Admitting: Family Medicine

## 2018-01-04 ENCOUNTER — Other Ambulatory Visit (HOSPITAL_COMMUNITY): Payer: Self-pay | Admitting: Family Medicine

## 2018-01-04 DIAGNOSIS — I6523 Occlusion and stenosis of bilateral carotid arteries: Secondary | ICD-10-CM | POA: Insufficient documentation

## 2018-01-04 DIAGNOSIS — R0989 Other specified symptoms and signs involving the circulatory and respiratory systems: Secondary | ICD-10-CM | POA: Diagnosis not present

## 2018-01-04 DIAGNOSIS — Z87891 Personal history of nicotine dependence: Secondary | ICD-10-CM | POA: Diagnosis not present

## 2018-01-07 ENCOUNTER — Other Ambulatory Visit: Payer: Self-pay | Admitting: Otolaryngology

## 2018-01-07 DIAGNOSIS — H918X2 Other specified hearing loss, left ear: Secondary | ICD-10-CM

## 2018-01-07 DIAGNOSIS — IMO0001 Reserved for inherently not codable concepts without codable children: Secondary | ICD-10-CM

## 2018-01-18 ENCOUNTER — Ambulatory Visit
Admission: RE | Admit: 2018-01-18 | Discharge: 2018-01-18 | Disposition: A | Discharge status: Home / Self Care | Payer: 59 | Source: Ambulatory Visit | Attending: Otolaryngology | Admitting: Otolaryngology

## 2018-01-18 DIAGNOSIS — IMO0001 Reserved for inherently not codable concepts without codable children: Secondary | ICD-10-CM

## 2018-01-18 DIAGNOSIS — H918X2 Other specified hearing loss, left ear: Secondary | ICD-10-CM

## 2018-01-18 MED ORDER — GADOBENATE DIMEGLUMINE 529 MG/ML IV SOLN
15.0000 mL | Freq: Once | INTRAVENOUS | Status: AC | PRN
  Administered 2018-01-18: 15 mL via INTRAVENOUS

## 2018-06-18 ENCOUNTER — Other Ambulatory Visit: Payer: Self-pay | Admitting: Family Medicine

## 2018-06-18 DIAGNOSIS — E010 Iodine-deficiency related diffuse (endemic) goiter: Secondary | ICD-10-CM

## 2018-06-19 ENCOUNTER — Ambulatory Visit
Admission: RE | Admit: 2018-06-19 | Discharge: 2018-06-19 | Disposition: A | Discharge status: Home / Self Care | Payer: 59 | Source: Ambulatory Visit | Attending: Family Medicine | Admitting: Family Medicine

## 2018-06-19 DIAGNOSIS — E010 Iodine-deficiency related diffuse (endemic) goiter: Secondary | ICD-10-CM

## 2018-12-18 ENCOUNTER — Other Ambulatory Visit: Payer: Self-pay | Admitting: Family Medicine

## 2018-12-18 DIAGNOSIS — F17200 Nicotine dependence, unspecified, uncomplicated: Secondary | ICD-10-CM

## 2018-12-18 DIAGNOSIS — R0602 Shortness of breath: Secondary | ICD-10-CM

## 2018-12-27 ENCOUNTER — Ambulatory Visit
Admission: RE | Admit: 2018-12-27 | Discharge: 2018-12-27 | Disposition: A | Discharge status: Home / Self Care | Payer: 59 | Source: Ambulatory Visit | Attending: Family Medicine | Admitting: Family Medicine

## 2018-12-27 DIAGNOSIS — F17200 Nicotine dependence, unspecified, uncomplicated: Secondary | ICD-10-CM

## 2019-01-24 ENCOUNTER — Other Ambulatory Visit: Payer: Self-pay

## 2019-01-24 ENCOUNTER — Encounter: Payer: Self-pay | Admitting: Cardiology

## 2019-01-24 ENCOUNTER — Ambulatory Visit (INDEPENDENT_AMBULATORY_CARE_PROVIDER_SITE_OTHER): Payer: 59 | Admitting: Cardiology

## 2019-01-24 VITALS — BP 155/76 | HR 71 | Temp 98.3°F | Ht 69.0 in | Wt 179.0 lb

## 2019-01-24 DIAGNOSIS — R0989 Other specified symptoms and signs involving the circulatory and respiratory systems: Secondary | ICD-10-CM | POA: Diagnosis not present

## 2019-01-24 DIAGNOSIS — I350 Nonrheumatic aortic (valve) stenosis: Secondary | ICD-10-CM

## 2019-01-24 DIAGNOSIS — R030 Elevated blood-pressure reading, without diagnosis of hypertension: Secondary | ICD-10-CM | POA: Diagnosis not present

## 2019-01-24 NOTE — Progress Notes (Signed)
Patient referred by Aletha Halim., PA-C for aortic valve and coronary calcification  Subjective:   Harry Young, male    DOB: [birth year], 63 y.o.   MRN: [id number]   Chief Complaint  Patient presents with  . Aortic Stenosis  . New Patient (Initial Visit)    HPI  63 y/o Caucasian male with hyperlipidemia, COPD, tobacco abuse, hypothyroidism due to Hashimoto thyroiditis, referred for eavlaution of aortic valve and coronary calcification.  Aortic valve and coronary calcification was incidentally found on lung cancer screening CT chest.  Patient is an active pack-a-day smoker.  He has had stable exertional dyspnea.  He has no diagnosis of COPD.  He denies any chest pain.  Blood pressure is elevated today, but usually well controlled according to the patient.   Past Medical History:  Diagnosis Date  . Bladder stones   . Bladder tumor   . COPD (chronic obstructive pulmonary disease)   . History of kidney stones   . History of kidney stones   . Low back pain radiating to left leg   . Nocturia   . Productive cough   . Smokers' cough   . Transitional cell carcinoma of bladder UROLOGIST--  DR OTTELIN   S/P TURBT X3--  HX BCG TX'S  . Urgency of urination      Past Surgical History:  Procedure Laterality Date  . CYSTOSCOPY W/ RETROGRADES Bilateral 12/13/2012   Procedure: CYSTOSCOPY WITH RETROGRADE PYELOGRAM;  Surgeon: Claybon Jabs, MD;  Location: [facility];  Service: Urology;  Laterality: Bilateral;  . CYSTOSCOPY WITH LITHOLAPAXY N/A 04/04/2013   Procedure: CYSTOSCOPY ;  Surgeon: Claybon Jabs, MD;  Location: [facility];  Service: Urology;  Laterality: N/A;  . LUMBAR [place] SURGERY  09-20-2005   L2 -- L5  . LUMBAR LAMINECTOMY/DECOMPRESSION MICRODISCECTOMY Left 01/09/2013   Procedure: Left  lumbar four-five microdiskectomy;  Surgeon: Winfield Cunas, MD;  Location: [facility];  Service: Neurosurgery;  Laterality: Left;  . LUMBAR WOUND  DEBRIDEMENT N/A 01/13/2013   Procedure: LUMBAR WOUND DEBRIDEMENT;  Surgeon: Charlie Pitter, MD;  Location: [place] [facility];  Service: Neurosurgery;  Laterality: N/A;  Lumbar Wound Exploration,  Attempted Placement of Cerebrospinal Drain, and Repair of CSF Leak  . TRANSURETHRAL RESECTION OF BLADDER TUMOR  07-05-2009;   11-22-2009   bladder cancer  . TRANSURETHRAL RESECTION OF BLADDER TUMOR N/A 12/13/2012   Procedure: TRANSURETHRAL RESECTION OF BLADDER TUMOR (TURBT) AND INSTILLATION OF MYTOMYCIN;  Surgeon: Claybon Jabs, MD;  Location: [facility];  Service: Urology;  Laterality: N/A;  . TRANSURETHRAL RESECTION OF BLADDER TUMOR WITH GYRUS (TURBT-GYRUS) N/A 04/04/2013   Procedure: TRANSURETHRAL RESECTION OF BLADDER TUMOR WITH GYRUS (TURBT-GYRUS);  Surgeon: Claybon Jabs, MD;  Location: [facility];  Service: Urology;  Laterality: N/A;     Social History   Socioeconomic History  . Marital status: Married    Spouse name: Not on file  . Number of children: Not on file  . Years of education: Not on file  . Highest education level: Not on file  Occupational History  . Not on file  Social Needs  . Financial resource strain: Not on file  . Food insecurity    Worry: Not on file    Inability: Not on file  . Transportation needs    Medical: Not on file    Non-medical: Not on file  Tobacco Use  . Smoking status: Current Every Day Smoker  Packs/day: 1.00    Years: 42.00    Pack years: 42.00    Types: Cigarettes  . Smokeless tobacco: Never Used  Substance and Sexual Activity  . Alcohol use: No  . Drug use: No  . Sexual activity: Not on file  Lifestyle  . Physical activity    Days per week: Not on file    Minutes per session: Not on file  . Stress: Not on file  Relationships  . Social Herbalist on phone: Not on file    Gets together: Not on file    Attends religious service: Not on file    Active member of club or organization: Not on file     Attends meetings of clubs or organizations: Not on file    Relationship status: Not on file  . Intimate partner violence    Fear of current or ex partner: Not on file    Emotionally abused: Not on file    Physically abused: Not on file    Forced sexual activity: Not on file  Other Topics Concern  . Not on file  Social History Narrative  . Not on file     Family History  Problem Relation Age of Onset  . Hyperlipidemia Mother   . Heart disease Father   . Heart failure Father   . Diabetes Brother      Current Outpatient Medications on File Prior to Visit  Medication Sig Dispense Refill  . HYDROcodone-acetaminophen (NORCO) 10-325 MG per tablet Take 1-2 tablets by mouth every 4 (four) hours as needed. 30 tablet 0  . HYDROcodone-acetaminophen (NORCO/VICODIN) 5-325 MG per tablet Take 1 tablet by mouth every 6 (six) hours as needed for pain. 70 tablet 0  . Naproxen Sodium (ALEVE) 220 MG CAPS Take 1 capsule by mouth every other day.    . phenazopyridine (PYRIDIUM) 200 MG tablet Take 1 tablet (200 mg total) by mouth 3 (three) times daily as needed for pain. 30 tablet 0  . traZODone (DESYREL) 50 MG tablet Take 50 mg by mouth at bedtime as needed for sleep.     No current facility-administered medications on file prior to visit.     Cardiovascular studies:  EKG 01/24/2019: Sinus rhythm 73 bpm. Normal EKG.  CT Chest 12/27/2018: 1. Lung-RADS 1, negative. Continue annual screening with low-dose chest CT without contrast in 12 months. 2. Aortic atherosclerosis (ICD10-I70.0), coronary artery atherosclerosis and emphysema (ICD10-J43.9). 3. Aortic valvular calcifications. Consider echocardiography to evaluate for valvular dysfunction.  Vascular US 12/2017: Right Carotid: Velocities in the right ICA are consistent with a 1-39% stenosis.                Non-hemodynamically significant plaque <50% noted in the CCA.  Left Carotid: Velocities in the left ICA are consistent with a 1-39%  stenosis.               Non-hemodynamically significant plaque noted in the CCA. The ECA               appears >50% stenosed.  Vertebrals:  Bilateral vertebral arteries demonstrate antegrade flow. Subclavians: Left subclavian artery flow was disturbed. Normal flow hemodynamics              were seen in the right subclavian artery.  Recent labs: Not available.   Review of Systems  Constitution: Negative for decreased appetite, malaise/fatigue, weight gain and weight loss.  HENT: Negative for congestion.   Eyes: Negative for visual disturbance.  Cardiovascular: Positive  for dyspnea on exertion (Chronic, stable). Negative for chest pain, leg swelling, palpitations and syncope.  Respiratory: Negative for cough.   Endocrine: Negative for cold intolerance.  Hematologic/Lymphatic: Does not bruise/bleed easily.  Skin: Negative for itching and rash.  Musculoskeletal: Negative for myalgias.  Gastrointestinal: Negative for abdominal pain, nausea and vomiting.  Genitourinary: Negative for dysuria.  Neurological: Negative for dizziness and weakness.  Psychiatric/Behavioral: The patient is not nervous/anxious.   All other systems reviewed and are negative.        Vitals:   01/24/19 1352  BP: (!) 155/76  Pulse: 71  Temp: 98.3 F (36.8 C)  SpO2: 97%     Body mass index is 26.43 kg/m. Filed Weights   01/24/19 1352  Weight: 179 lb (81.2 kg)     Objective:   Physical Exam  Constitutional: He is oriented to person, place, and time. He appears well-developed and well-nourished. No distress.  HENT:  Head: Normocephalic and atraumatic.  Eyes: Pupils are equal, round, and reactive to light. Conjunctivae are normal.  Neck: No JVD present.  Cardiovascular: Normal rate and regular rhythm.  Pulses:      Dorsalis pedis pulses are 2+ on the right side and 1+ on the left side.       Posterior tibial pulses are 2+ on the right side and 0 on the left side.  Pulmonary/Chest: Effort normal  and breath sounds normal. He has no wheezes. He has no rales.  Abdominal: Soft. Bowel sounds are normal. There is no rebound.  Musculoskeletal:        General: No edema.  Lymphadenopathy:    He has no cervical adenopathy.  Neurological: He is alert and oriented to person, place, and time. No cranial nerve deficit.  Skin: Skin is warm and dry.  Psychiatric: He has a normal mood and affect.  Nursing note and vitals reviewed.         Assessment & Recommendations:   63 y/o Caucasian male with hyperlipidemia, COPD, tobacco abuse, hypothyroidism due to Hashimoto thyroiditis, referred for eavlaution of aortic valve and coronary calcification.  Aortic stenosis: On exam, appears moderate.  Will obtain echocardiogram.    Coronary calcification:  After ensuring no severe aortic stenosis on echocardiogram, I would consider exercise treadmill stress test for recertification given presence of risk factors and coronary calcification. Continue aspirin 81 mg daily  Elevated blood pressure without diagnosis of hypertension: Blood pressure usually well controlled according to the patient.  Will recheck at next visit.  Hyperlipidemia: Given coronary calcification and risk factors, ideally would like to initiate statin.  However, patient is reportedly statin intolerant.  I will obtain lipid panel results from [facility].    Tobacco cessation counseling: - Currently smoking 1 packs/day   - Patient was informed of the dangers of tobacco abuse including stroke, cancer, and MI, as well as benefits of tobacco cessation. - Patient is at pre-contemplative stage.. - Approximately 6 mins were spent counseling patient cessation techniques. We discussed various methods to help quit smoking, including deciding on a date to quit, joining a support group, pharmacological agents - I will reassess his progress at the next follow-up visit  Once patient on 64, will obtain screening ultrasound to evaluate for AAA.    Thank you for referring the patient to [place]. Please feel free to contact with any questions.  [name], MD [facility]. [facility] Pager: [phone] Office: [phone] If no answer Cell [phone]

## 2019-02-06 ENCOUNTER — Other Ambulatory Visit: Payer: Self-pay | Admitting: Cardiology

## 2019-02-06 ENCOUNTER — Other Ambulatory Visit: Payer: Self-pay

## 2019-02-06 ENCOUNTER — Ambulatory Visit (INDEPENDENT_AMBULATORY_CARE_PROVIDER_SITE_OTHER): Payer: 59

## 2019-02-06 ENCOUNTER — Ambulatory Visit: Payer: 59

## 2019-02-06 DIAGNOSIS — I350 Nonrheumatic aortic (valve) stenosis: Secondary | ICD-10-CM

## 2019-02-06 DIAGNOSIS — I6523 Occlusion and stenosis of bilateral carotid arteries: Secondary | ICD-10-CM

## 2019-02-07 NOTE — Progress Notes (Signed)
Patient referred by Aletha Halim., PA-C for aortic valve and coronary calcification  Subjective:   Harry Young, male    DOB: [birth year], 63 y.o.   MRN: [id number]   Chief Complaint  Patient presents with  . Heart Murmur  . Hypertension  . Follow-up    Carotid Ultrasound    HPI  63 y/o Caucasian male with hyperlipidemia, COPD, tobacco abuse, hypothyroidism due to Hashimoto thyroiditis, mild AS, mild AI. referred for eavlaution of aortic valve and coronary calcification.  Blood pressure remains elevated. He still has exertional dyspnea.   Past Medical History:  Diagnosis Date  . Bladder stones   . Bladder tumor   . COPD (chronic obstructive pulmonary disease) ([place])   . History of kidney stones   . History of kidney stones   . Low back pain radiating to left leg   . Nocturia   . Productive cough   . Smokers' cough ([place])   . Transitional cell carcinoma of bladder (HCC) UROLOGIST--  DR OTTELIN   S/P TURBT X3--  HX BCG TX'S  . Urgency of urination      Past Surgical History:  Procedure Laterality Date  . CYSTOSCOPY W/ RETROGRADES Bilateral 12/13/2012   Procedure: CYSTOSCOPY WITH RETROGRADE PYELOGRAM;  Surgeon: Claybon Jabs, MD;  Location: [facility];  Service: Urology;  Laterality: Bilateral;  . CYSTOSCOPY WITH LITHOLAPAXY N/A 04/04/2013   Procedure: CYSTOSCOPY ;  Surgeon: Claybon Jabs, MD;  Location[facility];  Service: Urology;  Laterality: N/A;  . LUMBAR [place] SURGERY  09-20-2005   L2 -- L5  . LUMBAR LAMINECTOMY/DECOMPRESSION MICRODISCECTOMY Left 01/09/2013   Procedure: Left  lumbar four-five microdiskectomy;  Surgeon: Winfield Cunas, MD;  Location: [facility];  Service: Neurosurgery;  Laterality: Left;  . LUMBAR WOUND DEBRIDEMENT N/A 01/13/2013   Procedure: LUMBAR WOUND DEBRIDEMENT;  Surgeon: Charlie Pitter, MD;  Location: [place] [facility];  Service: Neurosurgery;  Laterality: N/A;  Lumbar Wound Exploration,  Attempted Placement of  Cerebrospinal Drain, and Repair of CSF Leak  . TRANSURETHRAL RESECTION OF BLADDER TUMOR  07-05-2009;   11-22-2009   bladder cancer  . TRANSURETHRAL RESECTION OF BLADDER TUMOR N/A 12/13/2012   Procedure: TRANSURETHRAL RESECTION OF BLADDER TUMOR (TURBT) AND INSTILLATION OF MYTOMYCIN;  Surgeon: Claybon Jabs, MD;  Location: [facility];  Service: Urology;  Laterality: N/A;  . TRANSURETHRAL RESECTION OF BLADDER TUMOR WITH GYRUS (TURBT-GYRUS) N/A 04/04/2013   Procedure: TRANSURETHRAL RESECTION OF BLADDER TUMOR WITH GYRUS (TURBT-GYRUS);  Surgeon: Claybon Jabs, MD;  Location: [facility];  Service: Urology;  Laterality: N/A;     Social History   Socioeconomic History  . Marital status: Married    Spouse name: Not on file  . Number of children: 0  . Years of education: Not on file  . Highest education level: Not on file  Occupational History  . Not on file  Social Needs  . Financial resource strain: Not on file  . Food insecurity    Worry: Not on file    Inability: Not on file  . Transportation needs    Medical: Not on file    Non-medical: Not on file  Tobacco Use  . Smoking status: Current Every Day Smoker    Packs/day: 1.00    Years: 42.00    Pack years: 42.00    Types: Cigarettes  . Smokeless tobacco: Never Used  Substance and Sexual Activity  . Alcohol use: No  .  Drug use: No  . Sexual activity: Not on file  Lifestyle  . Physical activity    Days per week: Not on file    Minutes per session: Not on file  . Stress: Not on file  Relationships  . Social Herbalist on phone: Not on file    Gets together: Not on file    Attends religious service: Not on file    Active member of club or organization: Not on file    Attends meetings of clubs or organizations: Not on file    Relationship status: Not on file  . Intimate partner violence    Fear of current or ex partner: Not on file    Emotionally abused: Not on file    Physically  abused: Not on file    Forced sexual activity: Not on file  Other Topics Concern  . Not on file  Social History Narrative  . Not on file     Family History  Problem Relation Age of Onset  . Hyperlipidemia Mother   . Heart disease Father   . Heart failure Father   . Diabetes Brother      Current Outpatient Medications on File Prior to Visit  Medication Sig Dispense Refill  . aspirin EC 81 MG tablet Take 1 tablet by mouth daily.    . Calcium-Magnesium-Vitamin D (CALCIUM MAGNESIUM PO) Take 1,000 mg by mouth.    . ezetimibe (ZETIA) 10 MG tablet Take 1 tablet by mouth daily.    . fluticasone furoate-vilanterol (BREO ELLIPTA) 200-25 MCG/INH AEPB Inhale 1 puff into the lungs daily.    Marland Kitchen gabapentin (NEURONTIN) 300 MG capsule Take 1 capsule by mouth 3 (three) times daily.    Marland Kitchen levothyroxine (SYNTHROID) 125 MCG tablet Take 1 tablet by mouth daily.    . Naproxen Sodium (ALEVE) 220 MG CAPS Take 1 capsule by mouth every other day.    . pyridOXINE (VITAMIN B-6) 100 MG tablet Take 100 mg by mouth daily.    . vitamin B-12 (CYANOCOBALAMIN) 1000 MCG tablet Take 1,000 mcg by mouth daily.    Marland Kitchen zinc gluconate 50 MG tablet Take 50 mg by mouth daily.     No current facility-administered medications on file prior to visit.     Cardiovascular studies:  Echocardiogram 02/06/2019:  Normal LV systolic function with EF 55%. Left ventricle cavity is normal in size. Moderate concentric hypertrophy of the left ventricle. Normal global wall motion. Normal diastolic filling pattern. Calculated EF 55%. Left atrial cavity is mildly dilated by volume andi selongated. Mild aortic valve leaflet thickening with mild calcification. Mild (Grade I) aortic regurgitation. Mild aortic valve stenosis. Aortic valve peak pressure gradient of  29.5 and mean gradient of 15.3 mmHg, calculated aortic valve area 1.6 cm. Mild calcification of the mitral valve annulus. Trace mitral regurgitation.  Carotid artery duplex   02/06/19: Doppler flow velocities in the bilateral proximal internal carotid artery are consistent with stenosis in the range of 16-49% with moderate heterogeneous plaque. Stenosis in the left common carotid artery (<50%). Left external carotid <50% stenosis. Antegrade right vertebral artery flow. Antegrade left vertebral artery flow. Follow up in one year is appropriate if clinically indicated.  EKG 01/24/2019: Sinus rhythm 73 bpm. Normal EKG.  CT Chest 12/27/2018: 1. Lung-RADS 1, negative. Continue annual screening with low-dose chest CT without contrast in 12 months. 2. Aortic atherosclerosis (ICD10-I70.0), coronary artery atherosclerosis and emphysema (ICD10-J43.9). 3. Aortic valvular calcifications. Consider echocardiography to evaluate for valvular dysfunction.  Vascular US 12/2017: Right Carotid: Velocities in the right ICA are consistent with a 1-39% stenosis.                Non-hemodynamically significant plaque <50% noted in the CCA.  Left Carotid: Velocities in the left ICA are consistent with a 1-39% stenosis.               Non-hemodynamically significant plaque noted in the CCA. The ECA               appears >50% stenosed.  Vertebrals:  Bilateral vertebral arteries demonstrate antegrade flow. Subclavians: Left subclavian artery flow was disturbed. Normal flow hemodynamics              were seen in the right subclavian artery.  Recent labs: Not available.   Review of Systems  Constitution: Negative for decreased appetite, malaise/fatigue, weight gain and weight loss.  HENT: Negative for congestion.   Eyes: Negative for visual disturbance.  Cardiovascular: Positive for dyspnea on exertion (Chronic, stable). Negative for chest pain, leg swelling, palpitations and syncope.  Respiratory: Negative for cough.   Endocrine: Negative for cold intolerance.  Hematologic/Lymphatic: Does not bruise/bleed easily.  Skin: Negative for itching and rash.  Musculoskeletal:  Negative for myalgias.  Gastrointestinal: Negative for abdominal pain, nausea and vomiting.  Genitourinary: Negative for dysuria.  Neurological: Negative for dizziness and weakness.  Psychiatric/Behavioral: The patient is not nervous/anxious.   All other systems reviewed and are negative.        Vitals:   02/20/19 1542  BP: (!) 147/67  Pulse: 74  Temp: 98.4 F (36.9 C)  SpO2: 95%     Body mass index is 23.29 kg/m. Filed Weights   02/20/19 1542  Weight: 171 lb 11.2 oz (77.9 kg)     Objective:   Physical Exam  Constitutional: He is oriented to person, place, and time. He appears well-developed and well-nourished. No distress.  HENT:  Head: Normocephalic and atraumatic.  Eyes: Pupils are equal, round, and reactive to light. Conjunctivae are normal.  Neck: No JVD present.  Cardiovascular: Normal rate and regular rhythm.  Pulses:      Dorsalis pedis pulses are 2+ on the right side and 1+ on the left side.       Posterior tibial pulses are 2+ on the right side and 0 on the left side.  Pulmonary/Chest: Effort normal and breath sounds normal. He has no wheezes. He has no rales.  Abdominal: Soft. Bowel sounds are normal. There is no rebound.  Musculoskeletal:        General: No edema.  Lymphadenopathy:    He has no cervical adenopathy.  Neurological: He is alert and oriented to person, place, and time. No cranial nerve deficit.  Skin: Skin is warm and dry.  Psychiatric: He has a normal mood and affect.  Nursing note and vitals reviewed.         Assessment & Recommendations:   63 y/o Caucasian male with hyperlipidemia, COPD, tobacco abuse, hypothyroidism due to Hashimoto thyroiditis, referred for eavlaution of aortic valve and coronary calcification.  Mild AS, mild AI: Continue clinical follow up for now. Repeat echocardiogram in 05/2020.  Coronary calcification, exertional dyspnea:  Will obtain walking/lexiscan nuclear stress test to evaluate for ischemia.  Continue aspirin 81 mg daily  Hypertension: Added amlodipine 5 mg daily.   Hyperlipidemia: Given coronary calcification and risk factors, ideally would like to initiate statin.  However, patient is reportedly statin intolerant.  I will obtain lipid panel  results from PCP.    Tobacco cessation counseling: Re-emphasized tobacco cessation. He will need aorta US for AAA screening when he turns 3.  [name], MD [facility]. [facility] Pager: [phone] Office: [phone] If no answer Cell [phone]

## 2019-02-20 ENCOUNTER — Other Ambulatory Visit: Payer: Self-pay

## 2019-02-20 ENCOUNTER — Encounter: Payer: Self-pay | Admitting: Cardiology

## 2019-02-20 ENCOUNTER — Ambulatory Visit (INDEPENDENT_AMBULATORY_CARE_PROVIDER_SITE_OTHER): Payer: 59 | Admitting: Cardiology

## 2019-02-20 VITALS — BP 147/67 | HR 74 | Temp 98.4°F | Ht 72.0 in | Wt 171.7 lb

## 2019-02-20 DIAGNOSIS — I351 Nonrheumatic aortic (valve) insufficiency: Secondary | ICD-10-CM | POA: Diagnosis not present

## 2019-02-20 DIAGNOSIS — R0600 Dyspnea, unspecified: Secondary | ICD-10-CM | POA: Diagnosis not present

## 2019-02-20 DIAGNOSIS — R0609 Other forms of dyspnea: Secondary | ICD-10-CM

## 2019-02-20 DIAGNOSIS — I350 Nonrheumatic aortic (valve) stenosis: Secondary | ICD-10-CM | POA: Diagnosis not present

## 2019-02-20 MED ORDER — AMLODIPINE BESYLATE 5 MG PO TABS: 5.0000 mg | ORAL_TABLET | Freq: Every day | ORAL | 3 refills | Status: DC

## 2019-02-22 ENCOUNTER — Encounter: Payer: Self-pay | Admitting: Cardiology

## 2019-02-22 DIAGNOSIS — I351 Nonrheumatic aortic (valve) insufficiency: Secondary | ICD-10-CM

## 2019-02-22 DIAGNOSIS — R0609 Other forms of dyspnea: Secondary | ICD-10-CM

## 2019-02-22 DIAGNOSIS — I350 Nonrheumatic aortic (valve) stenosis: Secondary | ICD-10-CM | POA: Insufficient documentation

## 2019-02-22 DIAGNOSIS — R0600 Dyspnea, unspecified: Secondary | ICD-10-CM | POA: Insufficient documentation

## 2019-02-22 HISTORY — DX: Other forms of dyspnea: R06.09

## 2019-02-22 HISTORY — DX: Nonrheumatic aortic (valve) stenosis: I35.0

## 2019-02-22 HISTORY — DX: Nonrheumatic aortic (valve) insufficiency: I35.1

## 2019-03-24 ENCOUNTER — Ambulatory Visit (INDEPENDENT_AMBULATORY_CARE_PROVIDER_SITE_OTHER): Payer: 59

## 2019-03-24 ENCOUNTER — Other Ambulatory Visit: Payer: Self-pay

## 2019-03-24 DIAGNOSIS — R0609 Other forms of dyspnea: Secondary | ICD-10-CM

## 2019-03-24 DIAGNOSIS — R0600 Dyspnea, unspecified: Secondary | ICD-10-CM

## 2019-03-25 NOTE — Progress Notes (Deleted)
Patient referred by Aletha Halim., PA-C for aortic valve and coronary calcification  Subjective:   Harry Young, male    DOB: 28-Sep-1955, 63 y.o.   MRN: [id number]   No chief complaint on file.   HPI  63 y/o Caucasian male with hyperlipidemia, COPD, tobacco abuse, hypothyroidism due to Hashimoto thyroiditis, mild AS, mild AI. referred for eavlaution of aortic valve and coronary calcification.  Blood pressure remains elevated. He still has exertional dyspnea.   Past Medical History:  Diagnosis Date  . Bladder stones   . Bladder tumor   . COPD (chronic obstructive pulmonary disease) ([place])   . History of kidney stones   . History of kidney stones   . Low back pain radiating to left leg   . Nocturia   . Productive cough   . Smokers' cough ([place])   . Transitional cell carcinoma of bladder (HCC) UROLOGIST--  DR OTTELIN   S/P TURBT X3--  HX BCG TX'S  . Urgency of urination      Past Surgical History:  Procedure Laterality Date  . CYSTOSCOPY W/ RETROGRADES Bilateral 12/13/2012   Procedure: CYSTOSCOPY WITH RETROGRADE PYELOGRAM;  Surgeon: Claybon Jabs, MD;  Location: [facility];  Service: Urology;  Laterality: Bilateral;  . CYSTOSCOPY WITH LITHOLAPAXY N/A 04/04/2013   Procedure: CYSTOSCOPY ;  Surgeon: Claybon Jabs, MD;  Location: [facility];  Service: Urology;  Laterality: N/A;  . LUMBAR [place] SURGERY  09-20-2005   L2 -- L5  . LUMBAR LAMINECTOMY/DECOMPRESSION MICRODISCECTOMY Left 01/09/2013   Procedure: Left  lumbar four-five microdiskectomy;  Surgeon: Winfield Cunas, MD;  Location: [facility];  Service: Neurosurgery;  Laterality: Left;  . LUMBAR WOUND DEBRIDEMENT N/A 01/13/2013   Procedure: LUMBAR WOUND DEBRIDEMENT;  Surgeon: Charlie Pitter, MD;  Location: [place] [facility];  Service: Neurosurgery;  Laterality: N/A;  Lumbar Wound Exploration,  Attempted Placement of Cerebrospinal Drain, and Repair of CSF Leak  . TRANSURETHRAL RESECTION OF BLADDER  TUMOR  07-05-2009;   11-22-2009   bladder cancer  . TRANSURETHRAL RESECTION OF BLADDER TUMOR N/A 12/13/2012   Procedure: TRANSURETHRAL RESECTION OF BLADDER TUMOR (TURBT) AND INSTILLATION OF MYTOMYCIN;  Surgeon: Claybon Jabs, MD;  Location: [facility];  Service: Urology;  Laterality: N/A;  . TRANSURETHRAL RESECTION OF BLADDER TUMOR WITH GYRUS (TURBT-GYRUS) N/A 04/04/2013   Procedure: TRANSURETHRAL RESECTION OF BLADDER TUMOR WITH GYRUS (TURBT-GYRUS);  Surgeon: Claybon Jabs, MD;  Location: [facility];  Service: Urology;  Laterality: N/A;     Social History   Socioeconomic History  . Marital status: Married    Spouse name: Not on file  . Number of children: 0  . Years of education: Not on file  . Highest education level: Not on file  Occupational History  . Not on file  Social Needs  . Financial resource strain: Not on file  . Food insecurity    Worry: Not on file    Inability: Not on file  . Transportation needs    Medical: Not on file    Non-medical: Not on file  Tobacco Use  . Smoking status: Current Every Day Smoker    Packs/day: 1.00    Years: 42.00    Pack years: 42.00    Types: Cigarettes  . Smokeless tobacco: Never Used  Substance and Sexual Activity  . Alcohol use: No  . Drug use: No  . Sexual activity: Not on file  Lifestyle  . Physical  activity    Days per week: Not on file    Minutes per session: Not on file  . Stress: Not on file  Relationships  . Social Herbalist on phone: Not on file    Gets together: Not on file    Attends religious service: Not on file    Active member of club or organization: Not on file    Attends meetings of clubs or organizations: Not on file    Relationship status: Not on file  . Intimate partner violence    Fear of current or ex partner: Not on file    Emotionally abused: Not on file    Physically abused: Not on file    Forced sexual activity: Not on file  Other Topics Concern  .  Not on file  Social History Narrative  . Not on file     Family History  Problem Relation Age of Onset  . Hyperlipidemia Mother   . Heart disease Father   . Heart failure Father   . Diabetes Brother      Current Outpatient Medications on File Prior to Visit  Medication Sig Dispense Refill  . amLODipine (NORVASC) 5 MG tablet Take 1 tablet (5 mg total) by mouth daily. 30 tablet 3  . aspirin EC 81 MG tablet Take 1 tablet by mouth daily.    . Calcium-Magnesium-Vitamin D (CALCIUM MAGNESIUM PO) Take 1,000 mg by mouth.    . ezetimibe (ZETIA) 10 MG tablet Take 1 tablet by mouth daily.    . fluticasone furoate-vilanterol (BREO ELLIPTA) 200-25 MCG/INH AEPB Inhale 1 puff into the lungs daily.    Marland Kitchen gabapentin (NEURONTIN) 300 MG capsule Take 1 capsule by mouth 3 (three) times daily.    Marland Kitchen levothyroxine (SYNTHROID) 125 MCG tablet Take 1 tablet by mouth daily.    . Naproxen Sodium (ALEVE) 220 MG CAPS Take 1 capsule by mouth as needed.     . Pseudoeph-Doxylamine-DM-APAP (NYQUIL PO) Take by mouth at bedtime as needed.    . pyridOXINE (VITAMIN B-6) 100 MG tablet Take 100 mg by mouth daily.    . vitamin B-12 (CYANOCOBALAMIN) 1000 MCG tablet Take 1,000 mcg by mouth daily.    Marland Kitchen zinc gluconate 50 MG tablet Take 50 mg by mouth daily.     No current facility-administered medications on file prior to visit.     Cardiovascular studies:  Lexiscan Tetrofosmin Stress Test  03/24/2019: Nondiagnostic ECG stress. The LV is mildly dilated both in rest and stress images with LV end diastolic volume of Q000111Q mL. Mild degree medium extent perfusion consistent with mild (reversible) ischemia located in the mid inferoseptal wall and basal inferoseptal wall (Right Coronary Artery region) of left ventricle. Stress LV EF: 41%.  Intermediate risk study.  No previous exam available for comparison.  Echocardiogram 02/06/2019:  Normal LV systolic function with EF 55%. Left ventricle cavity is normal in size. Moderate  concentric hypertrophy of the left ventricle. Normal global wall motion. Normal diastolic filling pattern. Calculated EF 55%. Left atrial cavity is mildly dilated by volume andi selongated. Mild aortic valve leaflet thickening with mild calcification. Mild (Grade I) aortic regurgitation. Mild aortic valve stenosis. Aortic valve peak pressure gradient of  29.5 and mean gradient of 15.3 mmHg, calculated aortic valve area 1.6 cm. Mild calcification of the mitral valve annulus. Trace mitral regurgitation.  Carotid artery duplex  02/06/19: Doppler flow velocities in the bilateral proximal internal carotid artery are consistent with stenosis in the range of  16-49% with moderate heterogeneous plaque. Stenosis in the left common carotid artery (<50%). Left external carotid <50% stenosis. Antegrade right vertebral artery flow. Antegrade left vertebral artery flow. Follow up in one year is appropriate if clinically indicated.  EKG 01/24/2019: Sinus rhythm 73 bpm. Normal EKG.  CT Chest 12/27/2018: 1. Lung-RADS 1, negative. Continue annual screening with low-dose chest CT without contrast in 12 months. 2. Aortic atherosclerosis (ICD10-I70.0), coronary artery atherosclerosis and emphysema (ICD10-J43.9). 3. Aortic valvular calcifications. Consider echocardiography to evaluate for valvular dysfunction.  Vascular US 12/2017: Right Carotid: Velocities in the right ICA are consistent with a 1-39% stenosis.                Non-hemodynamically significant plaque <50% noted in the CCA.  Left Carotid: Velocities in the left ICA are consistent with a 1-39% stenosis.               Non-hemodynamically significant plaque noted in the CCA. The ECA               appears >50% stenosed.  Vertebrals:  Bilateral vertebral arteries demonstrate antegrade flow. Subclavians: Left subclavian artery flow was disturbed. Normal flow hemodynamics              were seen in the right subclavian artery.  Recent labs: Not  available.   Review of Systems  Constitution: Negative for decreased appetite, malaise/fatigue, weight gain and weight loss.  HENT: Negative for congestion.   Eyes: Negative for visual disturbance.  Cardiovascular: Positive for dyspnea on exertion (Chronic, stable). Negative for chest pain, leg swelling, palpitations and syncope.  Respiratory: Negative for cough.   Endocrine: Negative for cold intolerance.  Hematologic/Lymphatic: Does not bruise/bleed easily.  Skin: Negative for itching and rash.  Musculoskeletal: Negative for myalgias.  Gastrointestinal: Negative for abdominal pain, nausea and vomiting.  Genitourinary: Negative for dysuria.  Neurological: Negative for dizziness and weakness.  Psychiatric/Behavioral: The patient is not nervous/anxious.   All other systems reviewed and are negative.       *** There were no vitals filed for this visit.  *** There is no height or weight on file to calculate BMI. There were no vitals filed for this visit.   Objective:   Physical Exam  Constitutional: He is oriented to person, place, and time. He appears well-developed and well-nourished. No distress.  HENT:  Head: Normocephalic and atraumatic.  Eyes: Pupils are equal, round, and reactive to light. Conjunctivae are normal.  Neck: No JVD present.  Cardiovascular: Normal rate and regular rhythm.  Pulses:      Dorsalis pedis pulses are 2+ on the right side and 1+ on the left side.       Posterior tibial pulses are 2+ on the right side and 0 on the left side.  Pulmonary/Chest: Effort normal and breath sounds normal. He has no wheezes. He has no rales.  Abdominal: Soft. Bowel sounds are normal. There is no rebound.  Musculoskeletal:        General: No edema.  Lymphadenopathy:    He has no cervical adenopathy.  Neurological: He is alert and oriented to person, place, and time. No cranial nerve deficit.  Skin: Skin is warm and dry.  Psychiatric: He has a normal mood and affect.   Nursing note and vitals reviewed.         Assessment & Recommendations:   63 y/o Caucasian male with hyperlipidemia, COPD, tobacco abuse, hypothyroidism due to Hashimoto thyroiditis, referred for eavlaution of aortic valve and coronary calcification.  Mild AS, mild AI: Continue clinical follow up for now. Repeat echocardiogram in 05/2020.  Coronary calcification, exertional dyspnea:  Will obtain walking/lexiscan nuclear stress test to evaluate for ischemia. Continue aspirin 81 mg daily  Hypertension: Added amlodipine 5 mg daily.   Hyperlipidemia: Given coronary calcification and risk factors, ideally would like to initiate statin.  However, patient is reportedly statin intolerant.  I will obtain lipid panel results from PCP.    Tobacco cessation counseling: Re-emphasized tobacco cessation. He will need aorta US for AAA screening when he turns 51.  [name], MD [facility]. [place] Pager: [phone] Office: [phone] If no answer Cell [phone]

## 2019-03-31 ENCOUNTER — Ambulatory Visit: Payer: 59 | Admitting: Cardiology

## 2019-04-02 ENCOUNTER — Encounter: Payer: Self-pay | Admitting: Cardiology

## 2019-04-02 ENCOUNTER — Ambulatory Visit (INDEPENDENT_AMBULATORY_CARE_PROVIDER_SITE_OTHER): Payer: 59 | Admitting: Cardiology

## 2019-04-02 ENCOUNTER — Other Ambulatory Visit: Payer: Self-pay

## 2019-04-02 VITALS — BP 138/73 | HR 69 | Temp 97.4°F | Ht 70.0 in | Wt 171.0 lb

## 2019-04-02 DIAGNOSIS — R0600 Dyspnea, unspecified: Secondary | ICD-10-CM | POA: Diagnosis not present

## 2019-04-02 DIAGNOSIS — I10 Essential (primary) hypertension: Secondary | ICD-10-CM

## 2019-04-02 DIAGNOSIS — I350 Nonrheumatic aortic (valve) stenosis: Secondary | ICD-10-CM

## 2019-04-02 DIAGNOSIS — I351 Nonrheumatic aortic (valve) insufficiency: Secondary | ICD-10-CM | POA: Diagnosis not present

## 2019-04-02 DIAGNOSIS — I2510 Atherosclerotic heart disease of native coronary artery without angina pectoris: Secondary | ICD-10-CM

## 2019-04-02 DIAGNOSIS — R0609 Other forms of dyspnea: Secondary | ICD-10-CM

## 2019-04-02 NOTE — Progress Notes (Signed)
Patient referred by Aletha Halim., PA-C for aortic valve and coronary calcification  Subjective:   Harry Young, male    DOB: 12-Mar-1956, 63 y.o.   MRN: [id number]   Chief Complaint  Patient presents with  . Shortness of Breath  . Aortic Valve Stenosis  . Results  . Follow-up    HPI  63 y/o Caucasian male with hyperlipidemia, COPD, tobacco abuse, hypothyroidism due to Hashimoto thyroiditis, mild AS, mild AI. referred for eavlaution of aortic valve and coronary calcification.  Blood pressure is better controlled. He has stable, unchanged exertional dyspnea. Stress test results discussed with the patient, details below. Unfortunately, he continues to smoke up to a pack a day. I do not have his lipid panel results.   Past Medical History:  Diagnosis Date  . Bladder stones   . Bladder tumor   . COPD (chronic obstructive pulmonary disease) ([place])   . History of kidney stones   . History of kidney stones   . Low back pain radiating to left leg   . Nocturia   . Productive cough   . Smokers' cough ([place])   . Transitional cell carcinoma of bladder (HCC) UROLOGIST--  DR OTTELIN   S/P TURBT X3--  HX BCG TX'S  . Urgency of urination      Past Surgical History:  Procedure Laterality Date  . CYSTOSCOPY W/ RETROGRADES Bilateral 12/13/2012   Procedure: CYSTOSCOPY WITH RETROGRADE PYELOGRAM;  Surgeon: Claybon Jabs, MD;  Location: [facility];  Service: Urology;  Laterality: Bilateral;  . CYSTOSCOPY WITH LITHOLAPAXY N/A 04/04/2013   Procedure: CYSTOSCOPY ;  Surgeon: Claybon Jabs, MD;  Location: [facility];  Service: Urology;  Laterality: N/A;  . LUMBAR [place] SURGERY  09-20-2005   L2 -- L5  . LUMBAR LAMINECTOMY/DECOMPRESSION MICRODISCECTOMY Left 01/09/2013   Procedure: Left  lumbar four-five microdiskectomy;  Surgeon: Winfield Cunas, MD;  Location: [facility];  Service: Neurosurgery;  Laterality: Left;  . LUMBAR WOUND DEBRIDEMENT N/A 01/13/2013   Procedure: LUMBAR WOUND DEBRIDEMENT;  Surgeon: Charlie Pitter, MD;  Location: [place] [facility];  Service: Neurosurgery;  Laterality: N/A;  Lumbar Wound Exploration,  Attempted Placement of Cerebrospinal Drain, and Repair of CSF Leak  . TRANSURETHRAL RESECTION OF BLADDER TUMOR  07-05-2009;   11-22-2009   bladder cancer  . TRANSURETHRAL RESECTION OF BLADDER TUMOR N/A 12/13/2012   Procedure: TRANSURETHRAL RESECTION OF BLADDER TUMOR (TURBT) AND INSTILLATION OF MYTOMYCIN;  Surgeon: Claybon Jabs, MD;  Location: [facility];  Service: Urology;  Laterality: N/A;  . TRANSURETHRAL RESECTION OF BLADDER TUMOR WITH GYRUS (TURBT-GYRUS) N/A 04/04/2013   Procedure: TRANSURETHRAL RESECTION OF BLADDER TUMOR WITH GYRUS (TURBT-GYRUS);  Surgeon: Claybon Jabs, MD;  Location: [facility];  Service: Urology;  Laterality: N/A;     Social History   Socioeconomic History  . Marital status: Married    Spouse name: Not on file  . Number of children: 0  . Years of education: Not on file  . Highest education level: Not on file  Occupational History  . Not on file  Social Needs  . Financial resource strain: Not on file  . Food insecurity    Worry: Not on file    Inability: Not on file  . Transportation needs    Medical: Not on file    Non-medical: Not on file  Tobacco Use  . Smoking status: Current Every Day Smoker    Packs/day: 1.00  Years: 42.00    Pack years: 42.00    Types: Cigarettes  . Smokeless tobacco: Never Used  Substance and Sexual Activity  . Alcohol use: No  . Drug use: No  . Sexual activity: Not on file  Lifestyle  . Physical activity    Days per week: Not on file    Minutes per session: Not on file  . Stress: Not on file  Relationships  . Social Herbalist on phone: Not on file    Gets together: Not on file    Attends religious service: Not on file    Active member of club or organization: Not on file    Attends meetings of clubs or  organizations: Not on file    Relationship status: Not on file  . Intimate partner violence    Fear of current or ex partner: Not on file    Emotionally abused: Not on file    Physically abused: Not on file    Forced sexual activity: Not on file  Other Topics Concern  . Not on file  Social History Narrative  . Not on file     Family History  Problem Relation Age of Onset  . Hyperlipidemia Mother   . Heart disease Father   . Heart failure Father   . Diabetes Brother      Current Outpatient Medications on File Prior to Visit  Medication Sig Dispense Refill  . amLODipine (NORVASC) 5 MG tablet Take 1 tablet (5 mg total) by mouth daily. 30 tablet 3  . aspirin EC 81 MG tablet Take 1 tablet by mouth daily.    . Calcium-Magnesium-Vitamin D (CALCIUM MAGNESIUM PO) Take 1,000 mg by mouth.    . ezetimibe (ZETIA) 10 MG tablet Take 1 tablet by mouth daily.    . fluticasone furoate-vilanterol (BREO ELLIPTA) 200-25 MCG/INH AEPB Inhale 1 puff into the lungs daily.    Marland Kitchen gabapentin (NEURONTIN) 300 MG capsule Take 1 capsule by mouth 3 (three) times daily.    Marland Kitchen levothyroxine (SYNTHROID) 125 MCG tablet Take 1 tablet by mouth daily.    . Naproxen Sodium (ALEVE) 220 MG CAPS Take 1 capsule by mouth as needed.     . Pseudoeph-Doxylamine-DM-APAP (NYQUIL PO) Take by mouth at bedtime as needed.    . pyridOXINE (VITAMIN B-6) 100 MG tablet Take 100 mg by mouth daily.    . vitamin B-12 (CYANOCOBALAMIN) 1000 MCG tablet Take 1,000 mcg by mouth daily.    Marland Kitchen zinc gluconate 50 MG tablet Take 50 mg by mouth daily.     No current facility-administered medications on file prior to visit.     Cardiovascular studies:  Lexiscan Tetrofosmin Stress Test  03/24/2019: Nondiagnostic ECG stress. The LV is mildly dilated both in rest and stress images with LV end diastolic volume of Q000111Q mL. Mild degree medium extent perfusion consistent with mild (reversible) ischemia located in the mid inferoseptal wall and basal  inferoseptal wall (Right Coronary Artery region) of left ventricle. Stress LV EF: 41%.  Intermediate risk study.  No previous exam available for comparison.  Echocardiogram 02/06/2019:  Normal LV systolic function with EF 55%. Left ventricle cavity is normal in size. Moderate concentric hypertrophy of the left ventricle. Normal global wall motion. Normal diastolic filling pattern. Calculated EF 55%. Left atrial cavity is mildly dilated by volume andi selongated. Mild aortic valve leaflet thickening with mild calcification. Mild (Grade I) aortic regurgitation. Mild aortic valve stenosis. Aortic valve peak pressure gradient of  29.5  and mean gradient of 15.3 mmHg, calculated aortic valve area 1.6 cm. Mild calcification of the mitral valve annulus. Trace mitral regurgitation.  Carotid artery duplex  02/06/19: Doppler flow velocities in the bilateral proximal internal carotid artery are consistent with stenosis in the range of 16-49% with moderate heterogeneous plaque. Stenosis in the left common carotid artery (<50%). Left external carotid <50% stenosis. Antegrade right vertebral artery flow. Antegrade left vertebral artery flow. Follow up in one year is appropriate if clinically indicated.  EKG 01/24/2019: Sinus rhythm 73 bpm. Normal EKG.  CT Chest 12/27/2018: 1. Lung-RADS 1, negative. Continue annual screening with low-dose chest CT without contrast in 12 months. 2. Aortic atherosclerosis (ICD10-I70.0), coronary artery atherosclerosis and emphysema (ICD10-J43.9). 3. Aortic valvular calcifications. Consider echocardiography to evaluate for valvular dysfunction.  Vascular US 12/2017: Right Carotid: Velocities in the right ICA are consistent with a 1-39% stenosis.                Non-hemodynamically significant plaque <50% noted in the CCA.  Left Carotid: Velocities in the left ICA are consistent with a 1-39% stenosis.               Non-hemodynamically significant plaque noted in the CCA.  The ECA               appears >50% stenosed.  Vertebrals:  Bilateral vertebral arteries demonstrate antegrade flow. Subclavians: Left subclavian artery flow was disturbed. Normal flow hemodynamics              were seen in the right subclavian artery.  Recent labs: Not available.   Review of Systems  Constitution: Negative for decreased appetite, malaise/fatigue, weight gain and weight loss.  HENT: Negative for congestion.   Eyes: Negative for visual disturbance.  Cardiovascular: Positive for dyspnea on exertion (Chronic, stable). Negative for chest pain, leg swelling, palpitations and syncope.  Respiratory: Negative for cough.   Endocrine: Negative for cold intolerance.  Hematologic/Lymphatic: Does not bruise/bleed easily.  Skin: Negative for itching and rash.  Musculoskeletal: Negative for myalgias.  Gastrointestinal: Negative for abdominal pain, nausea and vomiting.  Genitourinary: Negative for dysuria.  Neurological: Negative for dizziness and weakness.  Psychiatric/Behavioral: The patient is not nervous/anxious.   All other systems reviewed and are negative.        Vitals:   04/02/19 1533  BP: 138/73  Pulse: 69  Temp: (!) 97.4 F (36.3 C)  SpO2: 94%     Body mass index is 24.54 kg/m. Filed Weights   04/02/19 1533  Weight: 171 lb (77.6 kg)     Objective:   Physical Exam  Constitutional: He is oriented to person, place, and time. He appears well-developed and well-nourished. No distress.  HENT:  Head: Normocephalic and atraumatic.  Eyes: Pupils are equal, round, and reactive to light. Conjunctivae are normal.  Neck: No JVD present.  Cardiovascular: Normal rate and regular rhythm.  Pulses:      Dorsalis pedis pulses are 2+ on the right side and 1+ on the left side.       Posterior tibial pulses are 2+ on the right side and 0 on the left side.  Pulmonary/Chest: Effort normal and breath sounds normal. He has no wheezes. He has no rales.  Abdominal: Soft.  Bowel sounds are normal. There is no rebound.  Musculoskeletal:        General: No edema.  Lymphadenopathy:    He has no cervical adenopathy.  Neurological: He is alert and oriented to person, place, and time.  No cranial nerve deficit.  Skin: Skin is warm and dry.  Psychiatric: He has a normal mood and affect.  Nursing note and vitals reviewed.         Assessment & Recommendations:   63 y/o Caucasian male with hyperlipidemia, COPD, tobacco abuse, hypothyroidism due to Hashimoto thyroiditis, with mild AS, mild AI, coronary calcification, exertional dyspnea.  Coronary calcification, exertional dyspnea:  Mild inferior ischemia. He does not have any angina symptoms. His stable exertional dyspnea is most likely due to COPD. Recommend medical management at this time. Continue Aspirin 81 mg daily. He is intolerant to statin, thus on Zetia. I will get lipid panel from PCP. If unfavorable, could consider PCSK9 inhibitor.   Mild AS, mild AI: Continue clinical follow up for now. Repeat echocardiogram in 05/2020.  Hypertension: Well controlled on amlodipine 5 mg daily.   Tobacco cessation counseling: Re-emphasized tobacco cessation.  F/u in March 2021.  [name], MD [facility]. [place] Pager: [phone] Office: [phone] If no answer Cell [phone]

## 2019-04-03 DIAGNOSIS — I10 Essential (primary) hypertension: Secondary | ICD-10-CM | POA: Insufficient documentation

## 2019-04-03 DIAGNOSIS — I2510 Atherosclerotic heart disease of native coronary artery without angina pectoris: Secondary | ICD-10-CM

## 2019-04-03 HISTORY — DX: Essential (primary) hypertension: I10

## 2019-04-03 HISTORY — DX: Atherosclerotic heart disease of native coronary artery without angina pectoris: I25.10

## 2019-04-12 ENCOUNTER — Other Ambulatory Visit: Payer: Self-pay | Admitting: Cardiology

## 2019-04-12 DIAGNOSIS — R0600 Dyspnea, unspecified: Secondary | ICD-10-CM

## 2019-04-12 DIAGNOSIS — R0609 Other forms of dyspnea: Secondary | ICD-10-CM

## 2019-07-28 ENCOUNTER — Other Ambulatory Visit: Payer: Self-pay

## 2019-07-28 ENCOUNTER — Encounter: Payer: Self-pay | Admitting: Cardiology

## 2019-07-28 ENCOUNTER — Ambulatory Visit: Payer: Medicare Other | Admitting: Cardiology

## 2019-07-28 VITALS — BP 139/73 | HR 86 | Temp 98.3°F | Ht 70.0 in | Wt 170.8 lb

## 2019-07-28 DIAGNOSIS — R0600 Dyspnea, unspecified: Secondary | ICD-10-CM

## 2019-07-28 DIAGNOSIS — I10 Essential (primary) hypertension: Secondary | ICD-10-CM

## 2019-07-28 DIAGNOSIS — R0609 Other forms of dyspnea: Secondary | ICD-10-CM

## 2019-07-28 DIAGNOSIS — I350 Nonrheumatic aortic (valve) stenosis: Secondary | ICD-10-CM

## 2019-07-28 DIAGNOSIS — I2510 Atherosclerotic heart disease of native coronary artery without angina pectoris: Secondary | ICD-10-CM

## 2019-07-28 DIAGNOSIS — I351 Nonrheumatic aortic (valve) insufficiency: Secondary | ICD-10-CM

## 2019-07-28 DIAGNOSIS — E782 Mixed hyperlipidemia: Secondary | ICD-10-CM | POA: Insufficient documentation

## 2019-07-28 MED ORDER — REPATHA 140 MG/ML ~~LOC~~ SOSY: 140.0000 mg | PREFILLED_SYRINGE | SUBCUTANEOUS | 6 refills | Status: DC

## 2019-07-28 NOTE — Progress Notes (Signed)
Patient referred by Aletha Halim., PA-C for aortic valve and coronary calcification  Subjective:   Harry Young, male    DOB: 1955-06-04, 64 y.o.   MRN: [id number]   Chief Complaint  Patient presents with  . Coronary Artery Disease  . Follow-up  . Results    labs    HPI  64 y/o Caucasian male with hyperlipidemia, COPD, tobacco abuse, hypothyroidism due to Hashimoto thyroiditis, mild AS, mild AI. referred for eavlaution of aortic valve and coronary calcification.  Blood pressure is better controlled. He has stable, unchanged exertional dyspnea. He continues to smoke up to a pack a day. Recent lipid panel results reviewed with the patient.   Current Outpatient Medications on File Prior to Visit  Medication Sig Dispense Refill  . amLODipine (NORVASC) 5 MG tablet TAKE 1 TABLET BY MOUTH EVERY DAY 90 tablet 1  . aspirin EC 81 MG tablet Take 1 tablet by mouth daily.    . Calcium-Magnesium-Vitamin D (CALCIUM MAGNESIUM PO) Take 1,000 mg by mouth.    . ezetimibe (ZETIA) 10 MG tablet Take 1 tablet by mouth daily.    . fluticasone furoate-vilanterol (BREO ELLIPTA) 200-25 MCG/INH AEPB Inhale 1 puff into the lungs daily.    Marland Kitchen gabapentin (NEURONTIN) 300 MG capsule Take 1 capsule by mouth 3 (three) times daily.    Marland Kitchen levothyroxine (SYNTHROID) 125 MCG tablet Take 1 tablet by mouth daily.    . Naproxen Sodium (ALEVE) 220 MG CAPS Take 1 capsule by mouth as needed.     . Pseudoeph-Doxylamine-DM-APAP (NYQUIL PO) Take by mouth at bedtime as needed.    . pyridOXINE (VITAMIN B-6) 100 MG tablet Take 100 mg by mouth daily.    . vitamin B-12 (CYANOCOBALAMIN) 1000 MCG tablet Take 1,000 mcg by mouth daily.    Marland Kitchen zinc gluconate 50 MG tablet Take 50 mg by mouth daily.     No current facility-administered medications on file prior to visit.    Cardiovascular studies:  EKG 07/28/2019: Sinus rhythm 88 bpm. Nonspecific ST depression  -Nondiagnostic.   Lexiscan Tetrofosmin Stress Test   03/24/2019: Nondiagnostic ECG stress. The LV is mildly dilated both in rest and stress images with LV end diastolic volume of 979 mL. Mild degree medium extent perfusion consistent with mild (reversible) ischemia located in the mid inferoseptal wall and basal inferoseptal wall (Right Coronary Artery region) of left ventricle. Stress LV EF: 41%.  Intermediate risk study.  No previous exam available for comparison.  Echocardiogram 02/06/2019:  Normal LV systolic function with EF 55%. Left ventricle cavity is normal in size. Moderate concentric hypertrophy of the left ventricle. Normal global wall motion. Normal diastolic filling pattern. Calculated EF 55%. Left atrial cavity is mildly dilated by volume andi selongated. Mild aortic valve leaflet thickening with mild calcification. Mild (Grade I) aortic regurgitation. Mild aortic valve stenosis. Aortic valve peak pressure gradient of  29.5 and mean gradient of 15.3 mmHg, calculated aortic valve area 1.6 cm. Mild calcification of the mitral valve annulus. Trace mitral regurgitation.  Carotid artery duplex  02/06/19: Doppler flow velocities in the bilateral proximal internal carotid artery are consistent with stenosis in the range of 16-49% with moderate heterogeneous plaque. Stenosis in the left common carotid artery (<50%). Left external carotid <50% stenosis. Antegrade right vertebral artery flow. Antegrade left vertebral artery flow. Follow up in one year is appropriate if clinically indicated.  CT Chest 12/27/2018: 1. Lung-RADS 1, negative. Continue annual screening with low-dose chest CT without contrast in 12  months. 2. Aortic atherosclerosis (ICD10-I70.0), coronary artery atherosclerosis and emphysema (ICD10-J43.9). 3. Aortic valvular calcifications. Consider echocardiography to evaluate for valvular dysfunction.  Recent labs: 07/09/2019: Glucose 96, BUN/Cr 15/1.1. EGFR 69. Na/K 141/4.4. Rest of the CMP normal H/H 15/47. MCV 95.  Platelets 203 Chol 205, TG 78, HDL 56, LDL 138 Non HDL chol 149 TSH 0.21 low    Review of Systems  Cardiovascular: Positive for dyspnea on exertion. Negative for chest pain, leg swelling, palpitations and syncope.         Vitals:   07/28/19 1336 07/28/19 1342  BP: (!) 153/79 139/73  Pulse: 92 86  Temp: 98.3 F (36.8 C)   SpO2: 99%      Body mass index is 24.51 kg/m. Filed Weights   07/28/19 1336  Weight: 170 lb 12.8 oz (77.5 kg)     Objective:   Physical Exam  Constitutional: He appears well-developed and well-nourished.  Neck: No JVD present.  Cardiovascular: Normal rate, regular rhythm, normal heart sounds and intact distal pulses.  No murmur heard. Pulmonary/Chest: Effort normal and breath sounds normal. He has no wheezes. He has no rales.  Musculoskeletal:        General: No edema.  Nursing note and vitals reviewed.         Assessment & Recommendations:   64 y/o Caucasian male with hyperlipidemia, COPD, tobacco abuse, hypothyroidism due to Hashimoto thyroiditis, with mild AS, mild AI, coronary calcification, exertional dyspnea.  Coronary calcification, exertional dyspnea:  Mild inferior ischemia. He does not have any angina symptoms. His stable exertional dyspnea is most likely due to COPD. Recommend medical management at this time. Continue Aspirin 81 mg daily. He is intolerant to statin, thus on Zetia. LDL remains elevated at 138. Will start McMullen.   Mild AS, mild AI: Continue clinical follow up for now. Repeat echocardiogram in 05/2020.  Hypertension: Well controlled on amlodipine 5 mg daily.   Tobacco cessation counseling: Re-emphasized tobacco cessation.  Repeat lipid panel and f/u in 3 months.   Harry Mormon, MD [facility]. [facility] Pager: [phone] Office: [phone] If no answer Cell [phone]

## 2019-08-01 ENCOUNTER — Telehealth: Payer: Self-pay

## 2019-08-01 NOTE — Telephone Encounter (Signed)
Patient called is concerned that Repatha will be to expensive. I advised patient to contact his pharmacy and find out his copay. Patient will call back if he can't afford the copay. I advised patient that we can submit a tier exception to medicare if needed. Patient will call back and let us know if he would like for [place] to proceed with request.

## 2019-10-20 ENCOUNTER — Other Ambulatory Visit: Payer: Self-pay | Admitting: Cardiology

## 2019-10-20 DIAGNOSIS — R0600 Dyspnea, unspecified: Secondary | ICD-10-CM

## 2019-10-20 DIAGNOSIS — R0609 Other forms of dyspnea: Secondary | ICD-10-CM

## 2019-10-31 ENCOUNTER — Telehealth: Payer: Self-pay

## 2019-10-31 DIAGNOSIS — I2510 Atherosclerotic heart disease of native coronary artery without angina pectoris: Secondary | ICD-10-CM

## 2019-10-31 MED ORDER — ALIROCUMAB 75 MG/ML ~~LOC~~ SOAJ: 75.0000 mg | SUBCUTANEOUS | 3 refills | Status: DC

## 2019-10-31 NOTE — Telephone Encounter (Signed)
That's fine with me 75 mg dose

## 2019-10-31 NOTE — Telephone Encounter (Signed)
Spoke with patient and explained Repatha is not covered under his insurance. Praluent was sent in to see if insurance will approve this. Patient will call back and let us know if any issues in getting Praluent.

## 2019-10-31 NOTE — Telephone Encounter (Signed)
Spoke with his pharmacy Repatha will not be covered. Prior authorization was denied. Pharmacy suggested sending in [place] to see if insurance will approve it.

## 2019-10-31 NOTE — Telephone Encounter (Signed)
Any idea who he spoke to?  Ma[facility], can he qualify for patient assistance?  Thanks MJP

## 2019-10-31 NOTE — Telephone Encounter (Signed)
Thank you :)

## 2019-11-03 ENCOUNTER — Ambulatory Visit: Payer: Medicare Other | Admitting: Cardiology

## 2020-01-12 ENCOUNTER — Other Ambulatory Visit: Payer: Self-pay | Admitting: Cardiology

## 2020-01-12 DIAGNOSIS — R0609 Other forms of dyspnea: Secondary | ICD-10-CM

## 2020-01-12 DIAGNOSIS — R0600 Dyspnea, unspecified: Secondary | ICD-10-CM

## 2020-04-04 ENCOUNTER — Other Ambulatory Visit: Payer: Self-pay | Admitting: Cardiology

## 2020-04-04 DIAGNOSIS — R0600 Dyspnea, unspecified: Secondary | ICD-10-CM

## 2020-04-04 DIAGNOSIS — R0609 Other forms of dyspnea: Secondary | ICD-10-CM

## 2020-04-23 ENCOUNTER — Other Ambulatory Visit: Payer: Medicare Other

## 2020-04-26 ENCOUNTER — Telehealth: Payer: Self-pay | Admitting: Cardiology

## 2020-05-21 ENCOUNTER — Ambulatory Visit: Payer: Medicare Other | Admitting: Cardiology

## 2020-06-27 ENCOUNTER — Other Ambulatory Visit: Payer: Self-pay | Admitting: Cardiology

## 2020-06-27 DIAGNOSIS — R0600 Dyspnea, unspecified: Secondary | ICD-10-CM

## 2020-06-27 DIAGNOSIS — R0609 Other forms of dyspnea: Secondary | ICD-10-CM

## 2020-07-27 ENCOUNTER — Other Ambulatory Visit: Payer: Self-pay

## 2020-07-27 ENCOUNTER — Ambulatory Visit: Payer: Medicare Other

## 2020-07-27 DIAGNOSIS — I350 Nonrheumatic aortic (valve) stenosis: Secondary | ICD-10-CM

## 2020-07-27 DIAGNOSIS — I351 Nonrheumatic aortic (valve) insufficiency: Secondary | ICD-10-CM

## 2020-08-02 NOTE — Progress Notes (Signed)
2nd attempt : Calls go straight to Vmbox. Called patient, NA, LMAM.

## 2020-08-02 NOTE — Progress Notes (Signed)
Called patient, Harry Young, Harry Young

## 2020-08-03 NOTE — Progress Notes (Signed)
Defer to the patient. PRN f/u

## 2020-08-03 NOTE — Progress Notes (Signed)
Patient called back. I have discussed results with him and I transferred call to front desk staff, patient is refusing to schedule a FU appointment, because he "feels like he does not need one."

## 2020-08-03 NOTE — Progress Notes (Signed)
3rd attempt : Calls go straight to [employer]mbox. Called patient, NA, LMAM.

## 2021-01-21 ENCOUNTER — Telehealth: Payer: Self-pay | Admitting: Gastroenterology

## 2021-01-21 DIAGNOSIS — Z1211 Encounter for screening for malignant neoplasm of colon: Secondary | ICD-10-CM

## 2021-01-21 MED ORDER — NA SULFATE-K SULFATE-MG SULF 17.5-3.13-1.6 GM/177ML PO SOLN: 1.0000 | Freq: Once | ORAL | 0 refills | Status: AC

## 2021-01-21 NOTE — Telephone Encounter (Signed)
Patient called after hours. He states he did not get the bowel prep as ordered from Dr. Ulyses Amor office for his colonoscopy on Monday. He reports he was supposed to pick up Suprep but it was not at the pharmacy. I ordered this for him, he states he has the instructions on how to take it. He appreciated the help.  Tiana Loft

## 2021-07-03 IMAGING — CT CT CHEST LUNG CANCER SCREENING LOW DOSE
2 of 5 series · 15 of 40 positions shown, 18 images · non-contrast
Comparison: Chest radiograph 01/07/2013.  No prior CT.

CLINICAL DATA: Forty pack-year smoker.  Bladder cancer.

EXAM:
CT CHEST WITHOUT CONTRAST LOW-DOSE FOR LUNG CANCER SCREENING
TECHNIQUE: Multidetector CT imaging of the chest was performed following the
standard protocol without IV contrast.

[Series 4: lung 1.00 br44 cor · coronal · 0.76mm/px · 3 of 317 slices shown]
[im 64/317  lung]
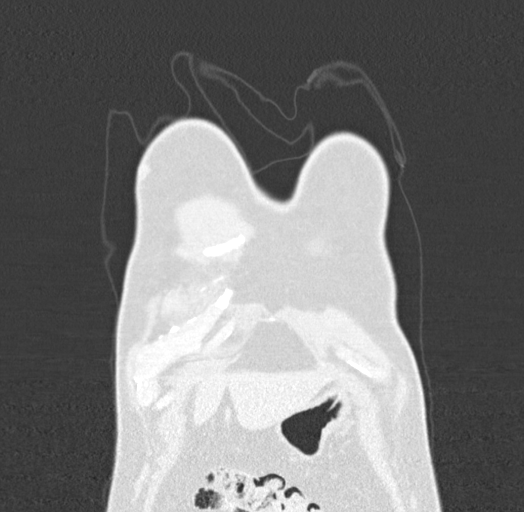
[im 127/317  lung]
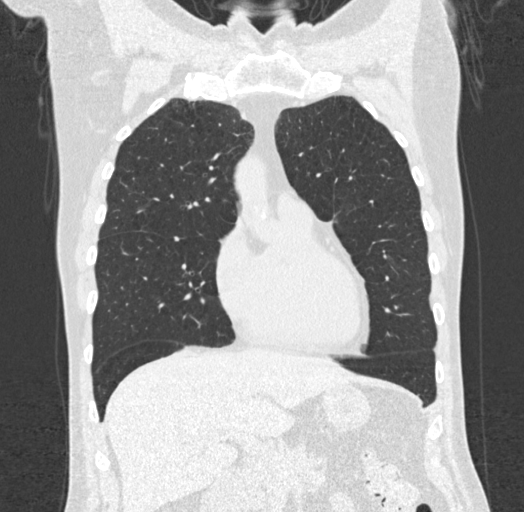
[im 190/317  lung]
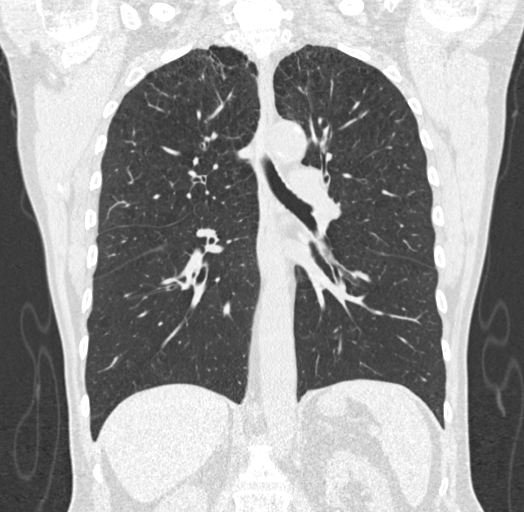

[Series 9: lung 1.00 br60 axial · axial · 0.78mm/px · z∈[-1096,-746]mm · 12 of 388 slices shown, 15 images]
[im 19/388  mediastinal]
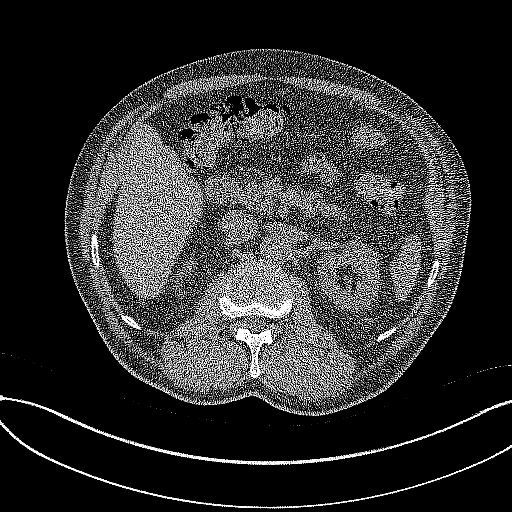
[im 19/388  lung]
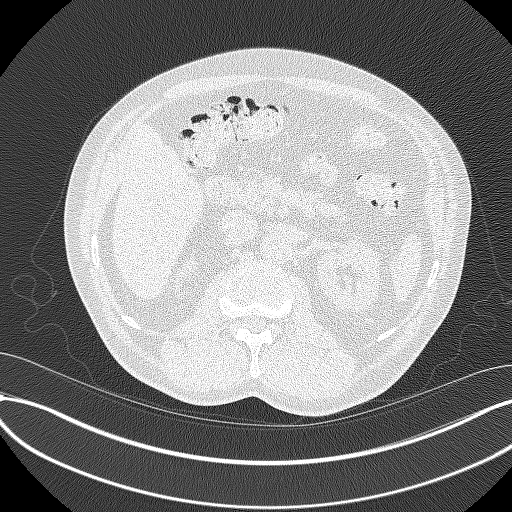
[im 56/388  lung]
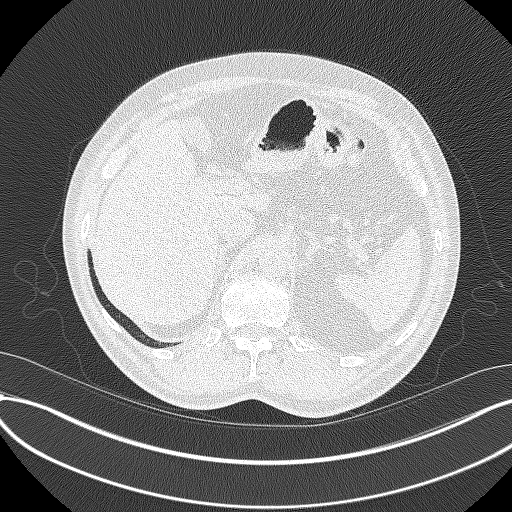
[im 93/388  lung]
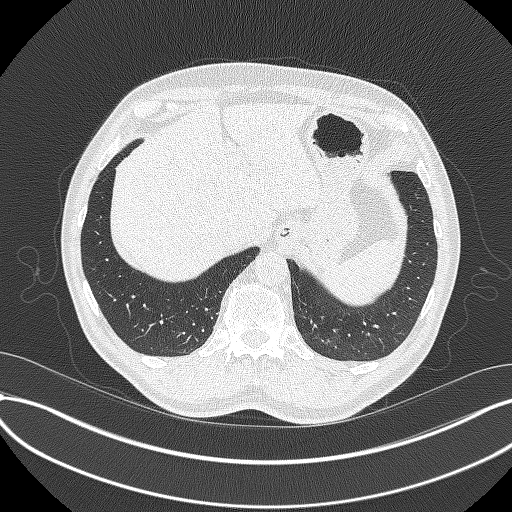
[im 111/388  lung]
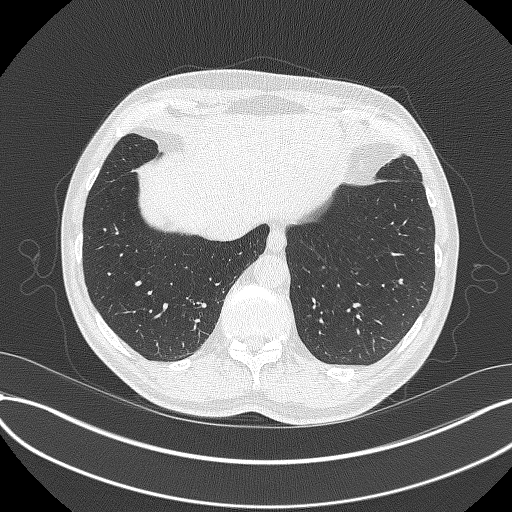
[im 148/388  mediastinal]
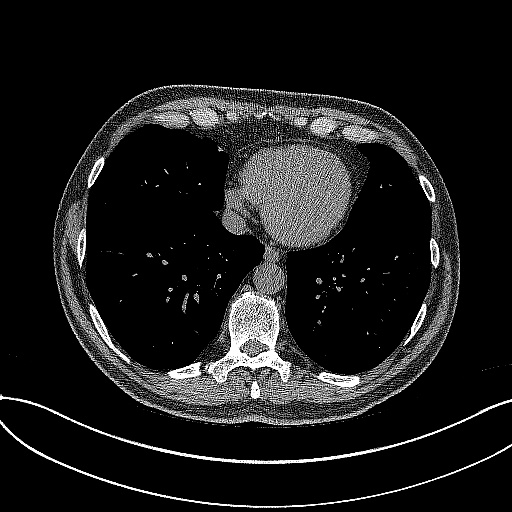
[im 148/388  lung]
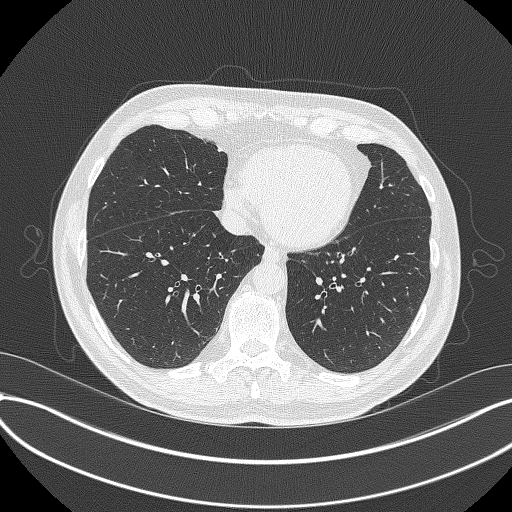
[im 185/388  lung]
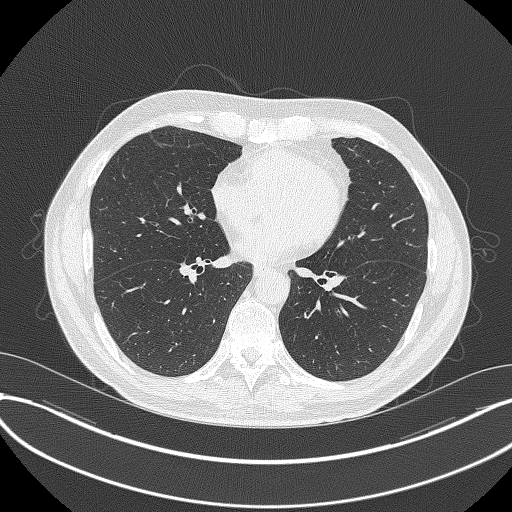
[im 203/388  lung]
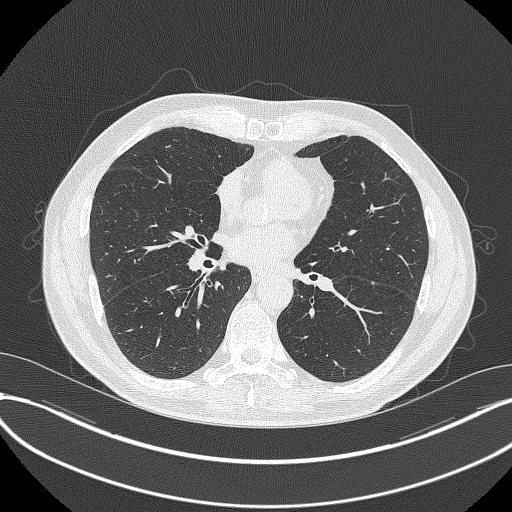
[im 240/388  lung]
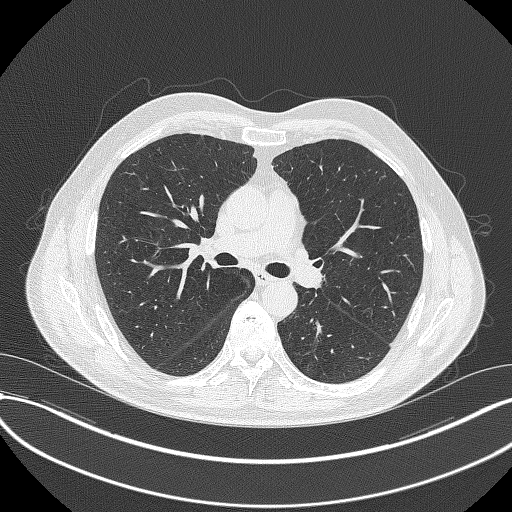
[im 277/388  mediastinal]
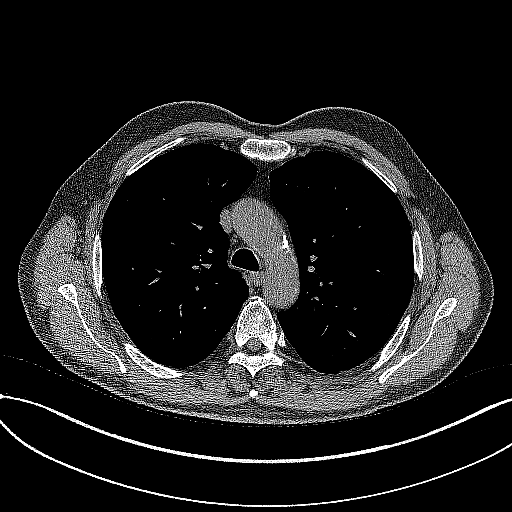
[im 277/388  lung]
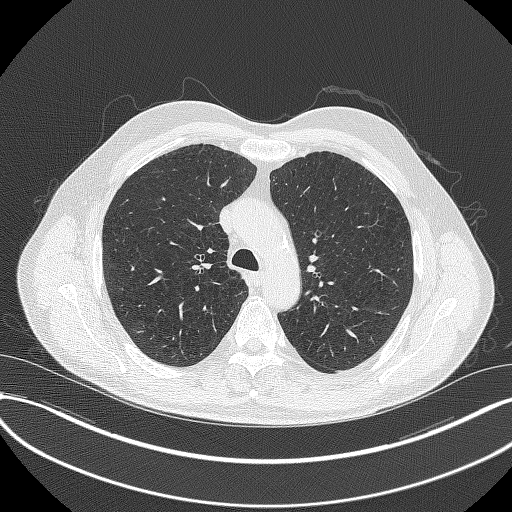
[im 295/388  lung]
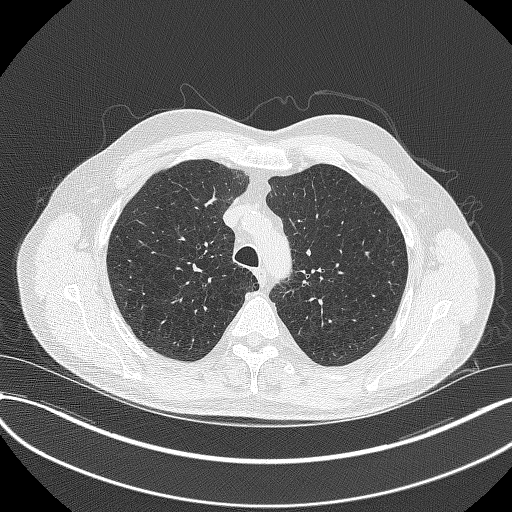
[im 332/388  lung]
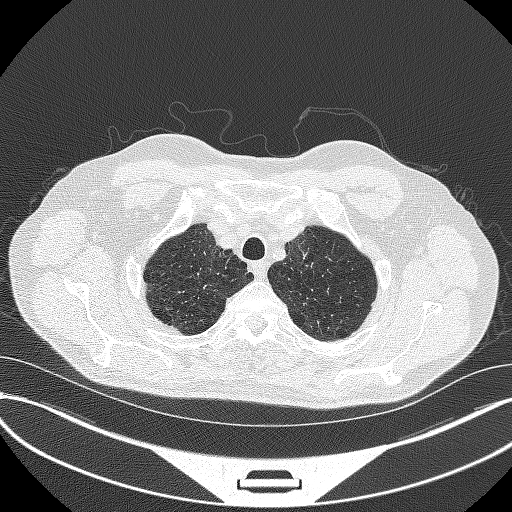
[im 369/388  lung]
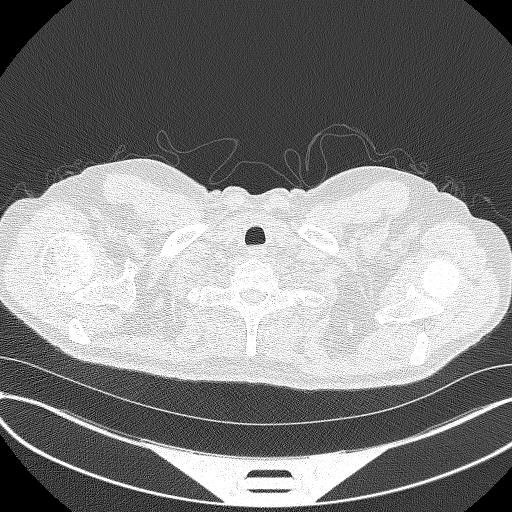

[15 of 40 positions shown; findings below may reference images not displayed]

FINDINGS: Cardiovascular: Aortic atherosclerosis. Normal heart size, without
pericardial effusion. Aortic valve calcification. Multivessel
coronary artery atherosclerosis.

Mediastinum/Nodes: No mediastinal or definite hilar adenopathy,
given limitations of unenhanced CT.

Lungs/Pleura: No pleural fluid. Moderate centrilobular emphysema.
Mild biapical pleuroparenchymal scarring.

No suspicious pulmonary nodule or mass.

Upper Abdomen: Nonspecific caudate lobe enlargement. Normal imaged
portions of the spleen, stomach, pancreas, gallbladder, adrenal
glands, kidneys.

Musculoskeletal: Midthoracic spondylosis.
IMPRESSION: 1. Lung-RADS 1, negative. Continue annual screening with low-dose
chest CT without contrast in 12 months.
2. Aortic atherosclerosis (182EN-757.7), coronary artery
atherosclerosis and emphysema (182EN-X57.U).
3. Aortic valvular calcifications. Consider echocardiography to
evaluate for valvular dysfunction.

## 2021-11-28 ENCOUNTER — Other Ambulatory Visit: Payer: Self-pay

## 2021-11-28 DIAGNOSIS — J410 Simple chronic bronchitis: Secondary | ICD-10-CM | POA: Insufficient documentation

## 2021-11-28 DIAGNOSIS — N210 Calculus in bladder: Secondary | ICD-10-CM | POA: Insufficient documentation

## 2021-11-28 DIAGNOSIS — E039 Hypothyroidism, unspecified: Secondary | ICD-10-CM | POA: Insufficient documentation

## 2021-11-28 DIAGNOSIS — Z87442 Personal history of urinary calculi: Secondary | ICD-10-CM | POA: Insufficient documentation

## 2021-11-28 DIAGNOSIS — M79605 Pain in left leg: Secondary | ICD-10-CM | POA: Insufficient documentation

## 2021-11-28 DIAGNOSIS — E559 Vitamin D deficiency, unspecified: Secondary | ICD-10-CM | POA: Insufficient documentation

## 2021-11-28 DIAGNOSIS — G8929 Other chronic pain: Secondary | ICD-10-CM | POA: Insufficient documentation

## 2021-11-28 DIAGNOSIS — F419 Anxiety disorder, unspecified: Secondary | ICD-10-CM | POA: Insufficient documentation

## 2021-11-28 DIAGNOSIS — J449 Chronic obstructive pulmonary disease, unspecified: Secondary | ICD-10-CM | POA: Insufficient documentation

## 2021-11-28 DIAGNOSIS — R9431 Abnormal electrocardiogram [ECG] [EKG]: Secondary | ICD-10-CM | POA: Insufficient documentation

## 2021-11-28 DIAGNOSIS — R058 Other specified cough: Secondary | ICD-10-CM | POA: Insufficient documentation

## 2021-11-28 DIAGNOSIS — R3915 Urgency of urination: Secondary | ICD-10-CM | POA: Insufficient documentation

## 2021-11-28 DIAGNOSIS — C679 Malignant neoplasm of bladder, unspecified: Secondary | ICD-10-CM | POA: Insufficient documentation

## 2021-11-28 DIAGNOSIS — R351 Nocturia: Secondary | ICD-10-CM | POA: Insufficient documentation

## 2021-12-13 ENCOUNTER — Encounter: Payer: Self-pay | Admitting: Student

## 2021-12-20 ENCOUNTER — Ambulatory Visit: Payer: 59 | Admitting: Cardiology

## 2021-12-21 ENCOUNTER — Other Ambulatory Visit: Payer: Self-pay

## 2021-12-21 DIAGNOSIS — Z8551 Personal history of malignant neoplasm of bladder: Secondary | ICD-10-CM

## 2021-12-29 ENCOUNTER — Encounter: Payer: Self-pay | Admitting: Thoracic Surgery (Cardiothoracic Vascular Surgery)

## 2022-01-03 NOTE — Progress Notes (Unsigned)
Synopsis: Referred for spiculated 8mm LUL pet avid nodule by Linus Galas, NP  Subjective:   PATIENT ID: Harry Young GENDER: male DOB: 03/11/1956, MRN: [id number]  No chief complaint on file.  66yM with history of smoking, emphysema/COPD, bladder cancer referred for spiculated 8mm LUL pet avid nodule picked up on lung cancer screening   Otherwise pertinent review of systems is negative.  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 Dr. Nanetta Batty- 04/01/15- "Heterogenous plaque- 1-39% RICA stenosis.  Homogenous plaque- 40-59% LICA stenosis.  >50% LECA stenosis.  Normal subclavian arteries bilaterally.  Patent vertebral arteries with antegrade flow.   Bladder stones    Bladder tumor    Constipation 08/07/2015   COPD (chronic obstructive pulmonary disease) (HCC)    Coronary artery disease involving native coronary artery of native heart without angina pectoris 04/03/2019   Displacement of lumbar intervertebral disc without myelopathy 04/14/2013   Essential (primary) hypertension 04/03/2019   Exertional dyspnea 02/22/2019   Golfer's elbow, right 03/30/2016   History of kidney stones    History of kidney stones    Hyperlipidemia 08/07/2015   Hypothyroidism due to Hashimoto's thyroiditis 07/15/2015   Hypothyroidism, unspecified    Increased urinary frequency 08/07/2015   Joint swelling 08/07/2015   Formatting of this note might be different from the original. Right   Leg weakness 08/07/2015   Low back pain radiating to left leg    Lumbar radiculopathy 10/21/2013   Lumbosacral neuritis 06/24/2014   Lumbosacral radiculitis 04/14/2013   Mixed hyperlipidemia    Neoplasm of bladder 08/07/2015   Formatting of this note might be different from the original. 4 cm   Nocturia     Nonrheumatic aortic valve insufficiency 02/22/2019   Nonrheumatic aortic valve stenosis 02/22/2019   Other chronic pain    Productive cough    Screen for colon cancer 08/07/2015   Smokers' cough (HCC)    Smokes 01/03/2018   Spinal stenosis of lumbar region 05/19/2015   Transitional cell carcinoma of bladder (HCC) UROLOGIST--  DR Vernie Ammons   S/P TURBT X3--  HX BCG TX'S   Urgency of urination    Vitamin D deficiency      Family History  Problem Relation Age of Onset   Hyperlipidemia Mother    Heart disease Father    Heart failure Father    COPD Father    Diabetes Brother      Past Surgical History:  Procedure Laterality Date   CYSTOSCOPY W/ RETROGRADES Bilateral 12/13/2012   Procedure: CYSTOSCOPY WITH RETROGRADE PYELOGRAM;  Surgeon: Garnett Farm, MD;  Location: [facility];  Service: Urology;  Laterality: Bilateral;   CYSTOSCOPY WITH LITHOLAPAXY N/A 04/04/2013   Procedure: CYSTOSCOPY ;  Surgeon: Garnett Farm, MD;  Location: [facility];  Service: Urology;  Laterality: N/A;   LUMBAR DISC SURGERY  09-20-2005   L2 -- L5   LUMBAR LAMINECTOMY/DECOMPRESSION MICRODISCECTOMY Left 01/09/2013   Procedure: Left  lumbar four-five microdiskectomy;  Surgeon: Carmela Hurt, MD;  Location: [facility];  Service: Neurosurgery;  Laterality: Left;   LUMBAR WOUND DEBRIDEMENT N/A 01/13/2013   Procedure: LUMBAR WOUND DEBRIDEMENT;  Surgeon: Temple Pacini, MD;  Location: [facility];  Service: Neurosurgery;  Laterality: N/A;  Lumbar Wound Exploration,  Attempted  Placement of Cerebrospinal Drain, and Repair of CSF Leak   TRANSURETHRAL RESECTION OF BLADDER TUMOR  07-05-2009;   11-22-2009   bladder cancer   TRANSURETHRAL RESECTION OF BLADDER TUMOR N/A 12/13/2012   Procedure: TRANSURETHRAL RESECTION OF BLADDER TUMOR (TURBT) AND INSTILLATION OF MYTOMYCIN;  Surgeon: Garnett Farm, MD;  Location: [facility];  Service: Urology;  Laterality: N/A;   TRANSURETHRAL RESECTION  OF BLADDER TUMOR WITH GYRUS (TURBT-GYRUS) N/A 04/04/2013   Procedure: TRANSURETHRAL RESECTION OF BLADDER TUMOR WITH GYRUS (TURBT-GYRUS);  Surgeon: Garnett Farm, MD;  Location: [facility];  Service: Urology;  Laterality: N/A;    Social History   Socioeconomic History   Marital status: Married    Spouse name: Not on file   Number of children: 0   Years of education: Not on file   Highest education level: Not on file  Occupational History   Not on file  Tobacco Use   Smoking status: Every Day    Packs/day: 1.00    Years: 42.00    Total pack years: 42.00    Types: Cigarettes   Smokeless tobacco: Never  Vaping Use   Vaping Use: Never used  Substance and Sexual Activity   Alcohol use: No   Drug use: No   Sexual activity: Not on file  Other Topics Concern   Not on file  Social History Narrative   Not on file   Social Determinants of Health   Financial Resource Strain: Not on file  Food Insecurity: Not on file  Transportation Needs: Not on file  Physical Activity: Not on file  Stress: Not on file  Social Connections: Not on file  Intimate Partner Violence: Not on file     Allergies  Allergen Reactions   Rosuvastatin Other (See Comments)   Sulfa Antibiotics Rash   Sulfacetamide Sodium Rash   Sulfamethoxazole-Trimethoprim Rash     Outpatient Medications Prior to Visit  Medication Sig Dispense Refill   Alirocumab 75 MG/ML SOAJ Inject 75 mg into the skin every 14 (fourteen) days. 6 pen 3   amLODipine (NORVASC) 5 MG tablet TAKE 1 TABLET BY MOUTH EVERY DAY 90 tablet 0   aspirin EC 81 MG tablet Take 1 tablet by mouth daily.     Calcium-Magnesium-Vitamin D (CALCIUM MAGNESIUM PO) Take 1,000 mg by mouth daily.     Evolocumab (REPATHA) 140 MG/ML SOSY Inject 140 mg into the skin every 14 (fourteen) days. 2.1 mL 6   ezetimibe (ZETIA) 10 MG tablet Take 1 tablet by mouth daily.     fluticasone furoate-vilanterol (BREO ELLIPTA) 200-25 MCG/INH AEPB Inhale 1 puff  into the lungs daily.     gabapentin (NEURONTIN) 300 MG capsule Take 1 capsule by mouth 3 (three) times daily.     levothyroxine (SYNTHROID) 112 MCG tablet Take 112 mcg by mouth daily before breakfast.     Pseudoeph-Doxylamine-DM-APAP (NYQUIL PO) Take by mouth at bedtime as needed for cough.     pyridOXINE (VITAMIN B-6) 100 MG tablet Take 100 mg by mouth daily.     sildenafil (VIAGRA) 50 MG tablet Take 50 mg by mouth daily as needed for erectile dysfunction.     vitamin B-12 (CYANOCOBALAMIN) 1000 MCG tablet Take 1,000 mcg by mouth daily.     zinc gluconate 50 MG tablet Take 50 mg by mouth daily.     No facility-administered medications prior to visit.       Objective:   Physical Exam:  General appearance: 66 y.o., male, NAD,  conversant  Eyes: anicteric sclerae; PERRL, tracking appropriately HENT: NCAT; MMM Neck: Trachea midline; no lymphadenopathy, no JVD Lungs: CTAB, no crackles, no wheeze, with normal respiratory effort CV: RRR, no murmur  Abdomen: Soft, non-tender; non-distended, BS present  Extremities: No peripheral edema, warm Skin: Normal turgor and texture; no rash Psych: Appropriate affect Neuro: Alert and oriented to person and place, no focal deficit     There were no vitals filed for this visit.   on *** LPM *** RA BMI Readings from Last 3 Encounters:  07/28/19 24.51 kg/m  04/02/19 24.54 kg/m  02/20/19 23.29 kg/m   Wt Readings from Last 3 Encounters:  07/28/19 170 lb 12.8 oz (77.5 kg)  04/02/19 171 lb (77.6 kg)  02/20/19 171 lb 11.2 oz (77.9 kg)     CBC    Component Value Date/Time   WBC 12.7 (H) 01/12/2013 2115   RBC 3.87 (L) 01/12/2013 2115   HGB 14.6 04/04/2013 1043   HCT 38.0 (L) 01/12/2013 2121   PLT 209 01/12/2013 2115   MCV 97.2 01/12/2013 2115   MCH 33.1 01/12/2013 2115   MCHC 34.0 01/12/2013 2115   RDW 14.2 01/12/2013 2115   LYMPHSABS 2.3 01/12/2013 2115   MONOABS 1.1 (H) 01/12/2013 2115   EOSABS 0.0 01/12/2013 2115   BASOSABS 0.0  01/12/2013 2115     Chest Imaging: PET/CT with spiculated PET avid LUL nodule 8mm, SUV 4.34, no pet avid mediastinal LAD, upper lobe predominant emphysema  Pulmonary Functions Testing Results:     No data to display           TTE 07/27/20: Left ventricle cavity is normal in size and wall thickness. Normal global  wall motion. Normal LV systolic function with EF 55%. Indeterminate  diastolic filling pattern. Calculated EF 55%.  Left atrial cavity is mildly dilated.  Probably trileaflet aortic valve. Mild aortic stenosis. Vmax 2.3 m/sec,  mean PG 11 mmHg, AVA 1.6 cm2 by continuity equation. Moderate (Grade II)  aortic regurgitation.  Mild (Grade I) mitral regurgitation.  Trace tricuspid regurgitation. Unable to calculate RV systolic pressure  due to inadequate TR jet. Estimated right atrial pressure 8 mmHg.       Assessment & Plan:    Plan:      Omar Person, MD  Pulmonary Critical Care 01/03/2022 5:21 PM

## 2022-01-03 NOTE — H&P (View-Only) (Signed)
Synopsis: Referred for spiculated 23m LUL pet avid nodule by TDeon Pilling NP  Subjective:   PATIENT ID: RLawanda CousinsGENDER: male DOB: [id number] MRN: [id number] Chief Complaint  Patient presents with   Consult    Pt consult LUL nodule seen on PET scan, pt does have COPD   66yM with history of smoking, emphysema/COPD, bladder cancer referred for spiculated 8103mLUL pet avid nodule picked up on lung cancer screening   He has some DOE takes breo with minor improvement. Rinses mouth/brushes teeth after use. He does have dry cough typically but occasionally has chest congestion. Last course of prednisone for COPD was probably a couple years ago. No hemoptysis. He has had some weight loss which he attributes to having his teeth pulled and change in appetite after stopping gabapentin. No fever. No night sweats. No hematuria.   Otherwise pertinent review of systems is negative.  He has no family history of lung cancer  53py active smoker - hasn't tried any medicines before to help him quit. He worked at [employer] since 2018.   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 Dr. JoQuay Burow11/17/16- "Heterogenous plaque- 1-39% RICA stenosis.  Homogenous plaque- 4015-17%ICA stenosis.  >50% LECA stenosis.  Normal subclavian arteries bilaterally.  Patent vertebral arteries with antegrade flow.   Bladder stones    Bladder tumor    Constipation 08/07/2015   COPD (chronic obstructive pulmonary disease) (HCC)    Coronary artery disease involving native coronary artery of native heart without angina pectoris 04/03/2019   Displacement of lumbar intervertebral disc without myelopathy 04/14/2013   Essential (primary) hypertension 04/03/2019   Exertional  dyspnea 02/22/2019   Golfer's elbow, right 03/30/2016   History of kidney stones    History of kidney stones    Hyperlipidemia 08/07/2015   Hypothyroidism due to Hashimoto's thyroiditis 07/15/2015   Hypothyroidism, unspecified    Increased urinary frequency 08/07/2015   Joint swelling 08/07/2015   Formatting of this note might be different from the original. Right   Leg weakness 08/07/2015   Low back pain radiating to left leg    Lumbar radiculopathy 10/21/2013   Lumbosacral neuritis 06/24/2014   Lumbosacral radiculitis 04/14/2013   Mixed hyperlipidemia    Neoplasm of bladder 08/07/2015   Formatting of this note might be different from the original. 4 cm   Nocturia    Nonrheumatic aortic valve insufficiency 02/22/2019   Nonrheumatic aortic valve stenosis 02/22/2019   Other chronic pain    Productive cough    Screen for colon cancer 08/07/2015   [place]   Smokes 01/03/2018   Spinal stenosis of lumbar region 05/19/2015   Transitional cell carcinoma of bladder (HCRudyardUROLOGIST--  DR OTKarsten Ro S/P TURBT X3--  HX BCG TX'S   Urgency of urination    Vitamin D deficiency      Family History  Problem Relation Age of Onset   Hyperlipidemia Mother    Heart disease Father    Heart failure Father    COPD Father    Diabetes Brother      Past Surgical History:  Procedure Laterality Date   CYSTOSCOPY W/ RETROGRADES Bilateral 12/13/2012   Procedure: CYSTOSCOPY WITH RETROGRADE PYELOGRAM;  Surgeon: MaClaybon JabsMD;  Location: [facility];  Service: Urology;  Laterality: Bilateral;   CYSTOSCOPY WITH LITHOLAPAXY N/A 04/04/2013   Procedure: CYSTOSCOPY ;  Surgeon: Claybon Jabs, MD;  Location: [facility];  Service: Urology;  Laterality: N/A;   LUMBAR DISC SURGERY  09-20-2005   L2 -- L5   LUMBAR LAMINECTOMY/DECOMPRESSION MICRODISCECTOMY Left 01/09/2013   Procedure: Left  lumbar four-five microdiskectomy;  Surgeon: Winfield Cunas, MD;  Location: [facility];   Service: Neurosurgery;  Laterality: Left;   LUMBAR WOUND DEBRIDEMENT N/A 01/13/2013   Procedure: LUMBAR WOUND DEBRIDEMENT;  Surgeon: Charlie Pitter, MD;  Location: [place] [facility];  Service: Neurosurgery;  Laterality: N/A;  Lumbar Wound Exploration,  Attempted Placement of Cerebrospinal Drain, and Repair of CSF Leak   TRANSURETHRAL RESECTION OF BLADDER TUMOR  07-05-2009;   11-22-2009   bladder cancer   TRANSURETHRAL RESECTION OF BLADDER TUMOR N/A 12/13/2012   Procedure: TRANSURETHRAL RESECTION OF BLADDER TUMOR (TURBT) AND INSTILLATION OF MYTOMYCIN;  Surgeon: Claybon Jabs, MD;  Location: [facility];  Service: Urology;  Laterality: N/A;   TRANSURETHRAL RESECTION OF BLADDER TUMOR WITH GYRUS (TURBT-GYRUS) N/A 04/04/2013   Procedure: TRANSURETHRAL RESECTION OF BLADDER TUMOR WITH GYRUS (TURBT-GYRUS);  Surgeon: Claybon Jabs, MD;  Location[facility];  Service: Urology;  Laterality: N/A;    Social History   Socioeconomic History   Marital status: Married    Spouse name: Not on file   Number of children: 0   Years of education: Not on file   Highest education level: Not on file  Occupational History   Not on file  Tobacco Use   Smoking status: Every Day    Packs/day: 1.00    Years: 53.00    Total pack years: 53.00    Types: Cigarettes    Start date: 07/04/1968   Smokeless tobacco: Never  Vaping Use   Vaping Use: Never used  Substance and Sexual Activity   Alcohol use: No   Drug use: No   Sexual activity: Not on file  Other Topics Concern   Not on file  Social History Narrative   Not on file   Social Determinants of Health   Financial Resource Strain: Not on file  Food Insecurity: Not on file  Transportation Needs: Not on file  Physical Activity: Not on file  Stress: Not on file  Social Connections: Not on file  Intimate Partner Violence: Not on file     Allergies  Allergen Reactions   Rosuvastatin Other (See Comments)   Sulfa Antibiotics Rash    Sulfacetamide Sodium Rash   Sulfamethoxazole-Trimethoprim Rash     Outpatient Medications Prior to Visit  Medication Sig Dispense Refill   Alirocumab 75 MG/ML SOAJ Inject 75 mg into the skin every 14 (fourteen) days. 6 pen 3   amLODipine (NORVASC) 5 MG tablet TAKE 1 TABLET BY MOUTH EVERY DAY 90 tablet 0   aspirin EC 81 MG tablet Take 1 tablet by mouth daily.     Calcium-Magnesium-Vitamin D (CALCIUM MAGNESIUM PO) Take 1,000 mg by mouth daily.     Evolocumab (REPATHA) 140 MG/ML SOSY Inject 140 mg into the skin every 14 (fourteen) days. 2.1 mL 6   ezetimibe (ZETIA) 10 MG tablet Take 1 tablet by mouth daily.     fluticasone furoate-vilanterol (BREO ELLIPTA) 200-25 MCG/INH AEPB Inhale 1 puff into the lungs daily.     levothyroxine (SYNTHROID) 112 MCG tablet Take 112 mcg by mouth daily before breakfast.  Pseudoeph-Doxylamine-DM-APAP (NYQUIL PO) Take by mouth at bedtime as needed for cough.     pyridOXINE (VITAMIN B-6) 100 MG tablet Take 100 mg by mouth daily.     sildenafil (VIAGRA) 50 MG tablet Take 50 mg by mouth daily as needed for erectile dysfunction.     vitamin B-12 (CYANOCOBALAMIN) 1000 MCG tablet Take 1,000 mcg by mouth daily.     zinc gluconate 50 MG tablet Take 50 mg by mouth daily.     gabapentin (NEURONTIN) 300 MG capsule Take 1 capsule by mouth 3 (three) times daily. (Patient not taking: Reported on 01/05/2022)     No facility-administered medications prior to visit.       Objective:   Physical Exam:  General appearance: 66 y.o., male, NAD, conversant  Eyes: anicteric sclerae; PERRL, tracking appropriately HENT: NCAT; MMM Neck: Trachea midline; no lymphadenopathy, no JVD Lungs: CTAB, no crackles, no wheeze, with normal respiratory effort CV: RRR, no murmur  Abdomen: Soft, non-tender; non-distended, BS present  Extremities: No peripheral edema, warm Skin: Normal turgor and texture; no rash Psych: Appropriate affect Neuro: Alert and oriented to person and place, no  focal deficit     Vitals:   01/05/22 1329  BP: 102/60  Pulse: 74  SpO2: 93%  Weight: 140 lb 6.4 oz (63.7 kg)  Height: '5\' 10"'$  (1.778 m)   93% on RA BMI Readings from Last 3 Encounters:  01/05/22 20.15 kg/m  07/28/19 24.51 kg/m  04/02/19 24.54 kg/m   Wt Readings from Last 3 Encounters:  01/05/22 140 lb 6.4 oz (63.7 kg)  07/28/19 170 lb 12.8 oz (77.5 kg)  04/02/19 171 lb (77.6 kg)     CBC    Component Value Date/Time   WBC 12.7 (H) 01/12/2013 2115   RBC 3.87 (L) 01/12/2013 2115   HGB 14.6 04/04/2013 1043   HCT 38.0 (L) 01/12/2013 2121   PLT 209 01/12/2013 2115   MCV 97.2 01/12/2013 2115   MCH 33.1 01/12/2013 2115   MCHC 34.0 01/12/2013 2115   RDW 14.2 01/12/2013 2115   LYMPHSABS 2.3 01/12/2013 2115   MONOABS 1.1 (H) 01/12/2013 2115   EOSABS 0.0 01/12/2013 2115   BASOSABS 0.0 01/12/2013 2115     Chest Imaging: PET/CT with spiculated PET avid LUL nodule 36m, SUV 4.34, no pet avid mediastinal LAD, upper lobe predominant emphysema      Pulmonary Functions Testing Results:     No data to display           TTE 07/27/20: Left ventricle cavity is normal in size and wall thickness. Normal global  wall motion. Normal LV systolic function with EF 55%. Indeterminate  diastolic filling pattern. Calculated EF 55%.  Left atrial cavity is mildly dilated.  Probably trileaflet aortic valve. Mild aortic stenosis. Vmax 2.3 m/sec,  mean PG 11 mmHg, AVA 1.6 cm2 by continuity equation. Moderate (Grade II)  aortic regurgitation.  Mild (Grade I) mitral regurgitation.  Trace tricuspid regurgitation. Unable to calculate RV systolic pressure  due to inadequate TR jet. Estimated right atrial pressure 8 mmHg.       Assessment & Plan:   # LUL pet avid 840mnodule: Likely primary lung but does have history of bladder cancer and suspicious L renal lesion - about to see urology. Mentions brother with [employer] interestingly.  # Emphysema # Suspected COPD gold B  #  Smoking Cessation encouraged, will revisit at next appointment  Plan: - ANCAs - will sort out date soon for robotic bronchoscopy under general -  trial stiolto 2 puffs daily, will clarify least expensive laba/lama - stop breo while on stiolto - RTC 2 weeks to review results of bronch   Maryjane Hurter, MD [facility] 01/05/2022 1:43 PM

## 2022-01-05 ENCOUNTER — Encounter: Payer: Self-pay | Admitting: Student

## 2022-01-05 ENCOUNTER — Ambulatory Visit (INDEPENDENT_AMBULATORY_CARE_PROVIDER_SITE_OTHER): Payer: Medicare Other | Admitting: Student

## 2022-01-05 ENCOUNTER — Other Ambulatory Visit (HOSPITAL_COMMUNITY): Payer: Self-pay

## 2022-01-05 ENCOUNTER — Telehealth: Payer: Self-pay | Admitting: Student

## 2022-01-05 VITALS — BP 102/60 | HR 74 | Ht 70.0 in | Wt 140.4 lb

## 2022-01-05 DIAGNOSIS — J432 Centrilobular emphysema: Secondary | ICD-10-CM

## 2022-01-05 DIAGNOSIS — R911 Solitary pulmonary nodule: Secondary | ICD-10-CM | POA: Diagnosis not present

## 2022-01-05 MED ORDER — STIOLTO RESPIMAT 2.5-2.5 MCG/ACT IN AERS: 2.0000 | INHALATION_SPRAY | Freq: Every day | RESPIRATORY_TRACT | 0 refills | Status: DC

## 2022-01-05 NOTE — Patient Instructions (Addendum)
-   I will speak with my colleagues Dr. Valeta Harms, Dr. Lamonte Sakai to see which of [place] can do this procedure for you the soonest and we will be in touch - STOP breo - START stiolto 2 puffs once daily - no need to rinse your mouth afterward unless you don't like the taste - I'll see you in a couple weeks to go over results

## 2022-01-05 NOTE — Telephone Encounter (Signed)
Which laba/lama is least expensive for him?/  Thanks!

## 2022-01-06 ENCOUNTER — Encounter (HOSPITAL_COMMUNITY): Payer: Self-pay | Admitting: Student

## 2022-01-06 ENCOUNTER — Telehealth: Payer: Self-pay | Admitting: Student

## 2022-01-06 ENCOUNTER — Other Ambulatory Visit: Payer: Self-pay

## 2022-01-06 NOTE — Telephone Encounter (Signed)
Pt has been scheduled for 8/28 at 1:15 at [facility] End[facility]. Case # [id number].  Pt will need to arrive 9:45 to get covid test prior due to living in [place] and possibly get another CT.  [place] is checking to see if another CT is needed.  Left vm for pt to call me for appt info.

## 2022-01-06 NOTE — Telephone Encounter (Signed)
Spoke to pt & gave him appt info  Nothing further needed. 

## 2022-01-06 NOTE — Progress Notes (Signed)
Spoke with pt for pre-op call. Pt states he has a heart murmur. Pt has been seen by Dr. Virgina Jock for a nonrheumatic valve stenosis/insufficiency. He has not seen Dr. Virgina Jock since 07/27/20 when he had an Echo done. At that time Dr. Virgina Jock told him to f/u as needed.   Pt states he is not diabetic.   Covid test to be done DOS.   Shower instructions given to pt and he voiced understanding.

## 2022-01-06 NOTE — Telephone Encounter (Signed)
Please schedule the following:  Provider performing procedure:Nathan Bennye Alm Diagnosis: Pulmonary nodule Which side if for nodule / mass? Left upper lobe Procedure: Robotic bronchoscopy  Has patient been spoken to by Provider and given informed consent? yes Anesthesia: General Do you need Fluro? yes Duration of procedure: 1.5hr Date: 01/09/22 Alternate Date:   Time: Any Location: [facility] Does patient have OSA? no DM? no Or Latex allergy? no Medication Restriction: None Anticoagulate/Antiplatelet: Aspirin 81 mg  Pre-op Labs Ordered:determined by Anesthesia Imaging request: Has PET/CT from [facility] 12/07/21 - if this can't be pushed through PACS, would need new Super D CT Chest since last alternative CT Chest from 11/23/21  (If, SuperDimension CT Chest, please have STAT courier sent to ENDO)  Please coordinate Pre-op COVID Testing

## 2022-01-09 ENCOUNTER — Ambulatory Visit (HOSPITAL_COMMUNITY): Payer: Medicare Other

## 2022-01-09 ENCOUNTER — Ambulatory Visit (HOSPITAL_BASED_OUTPATIENT_CLINIC_OR_DEPARTMENT_OTHER): Payer: Medicare Other | Admitting: Anesthesiology

## 2022-01-09 ENCOUNTER — Other Ambulatory Visit: Payer: Self-pay

## 2022-01-09 ENCOUNTER — Encounter (HOSPITAL_COMMUNITY): Payer: Self-pay | Admitting: Student

## 2022-01-09 ENCOUNTER — Ambulatory Visit (HOSPITAL_COMMUNITY): Payer: Medicare Other | Admitting: Anesthesiology

## 2022-01-09 ENCOUNTER — Encounter (HOSPITAL_COMMUNITY)
Admission: RE | Disposition: A | Discharge status: Home / Self Care | Payer: Self-pay | Source: Home / Self Care | Attending: Student

## 2022-01-09 ENCOUNTER — Ambulatory Visit (HOSPITAL_COMMUNITY)
Admission: RE | Admit: 2022-01-09 | Discharge: 2022-01-09 | Disposition: A | Discharge status: Home / Self Care | Payer: Medicare Other | Attending: Student | Admitting: Student

## 2022-01-09 DIAGNOSIS — I2510 Atherosclerotic heart disease of native coronary artery without angina pectoris: Secondary | ICD-10-CM

## 2022-01-09 DIAGNOSIS — I10 Essential (primary) hypertension: Secondary | ICD-10-CM | POA: Diagnosis not present

## 2022-01-09 DIAGNOSIS — R911 Solitary pulmonary nodule: Secondary | ICD-10-CM | POA: Insufficient documentation

## 2022-01-09 DIAGNOSIS — E039 Hypothyroidism, unspecified: Secondary | ICD-10-CM | POA: Insufficient documentation

## 2022-01-09 DIAGNOSIS — Z8551 Personal history of malignant neoplasm of bladder: Secondary | ICD-10-CM | POA: Insufficient documentation

## 2022-01-09 DIAGNOSIS — Z87442 Personal history of urinary calculi: Secondary | ICD-10-CM

## 2022-01-09 DIAGNOSIS — F17210 Nicotine dependence, cigarettes, uncomplicated: Secondary | ICD-10-CM | POA: Diagnosis not present

## 2022-01-09 DIAGNOSIS — J449 Chronic obstructive pulmonary disease, unspecified: Secondary | ICD-10-CM

## 2022-01-09 DIAGNOSIS — Z20822 Contact with and (suspected) exposure to covid-19: Secondary | ICD-10-CM | POA: Insufficient documentation

## 2022-01-09 DIAGNOSIS — Z01818 Encounter for other preprocedural examination: Secondary | ICD-10-CM

## 2022-01-09 DIAGNOSIS — J439 Emphysema, unspecified: Secondary | ICD-10-CM | POA: Diagnosis not present

## 2022-01-09 HISTORY — PX: BRONCHIAL BRUSHINGS: SHX5108

## 2022-01-09 HISTORY — PX: BRONCHIAL NEEDLE ASPIRATION BIOPSY: SHX5106

## 2022-01-09 HISTORY — PX: BRONCHIAL BIOPSY: SHX5109

## 2022-01-09 LAB — CBC
HCT: 43.5 % (ref 39.0–52.0)
Hemoglobin: 14.9 g/dL (ref 13.0–17.0)
MCH: 32.5 pg (ref 26.0–34.0)
MCHC: 34.3 g/dL (ref 30.0–36.0)
MCV: 95 fL (ref 80.0–100.0)
Platelets: 174 10*3/uL (ref 150–400)
RBC: 4.58 MIL/uL (ref 4.22–5.81)
RDW: 14.4 % (ref 11.5–15.5)
WBC: 7.9 10*3/uL (ref 4.0–10.5)
nRBC: 0 % (ref 0.0–0.2)

## 2022-01-09 LAB — SARS CORONAVIRUS 2 BY RT PCR: SARS Coronavirus 2 by RT PCR: NEGATIVE

## 2022-01-09 SURGERY — BRONCHOSCOPY, WITH BIOPSY USING ELECTROMAGNETIC NAVIGATION
Anesthesia: General | Laterality: Left

## 2022-01-09 MED ORDER — ESMOLOL HCL 100 MG/10ML IV SOLN
INTRAVENOUS | Status: DC | PRN
  Administered 2022-01-09: 30 mg via INTRAVENOUS

## 2022-01-09 MED ORDER — EPHEDRINE SULFATE-NACL 50-0.9 MG/10ML-% IV SOSY
PREFILLED_SYRINGE | INTRAVENOUS | Status: DC | PRN
  Administered 2022-01-09: 5 mg via INTRAVENOUS
  Administered 2022-01-09: 15 mg via INTRAVENOUS

## 2022-01-09 MED ORDER — CHLORHEXIDINE GLUCONATE 0.12 % MT SOLN
OROMUCOSAL | Status: AC
  Administered 2022-01-09: 15 mL via OROMUCOSAL
  Filled 2022-01-09: qty 15

## 2022-01-09 MED ORDER — SUGAMMADEX SODIUM 200 MG/2ML IV SOLN
INTRAVENOUS | Status: DC | PRN
  Administered 2022-01-09: 246.8 mg via INTRAVENOUS

## 2022-01-09 MED ORDER — PROPOFOL 10 MG/ML IV BOLUS
INTRAVENOUS | Status: DC | PRN
  Administered 2022-01-09: 70 mg via INTRAVENOUS

## 2022-01-09 MED ORDER — ONDANSETRON HCL 4 MG/2ML IJ SOLN
INTRAMUSCULAR | Status: DC | PRN
  Administered 2022-01-09: 4 mg via INTRAVENOUS

## 2022-01-09 MED ORDER — ROCURONIUM BROMIDE 10 MG/ML (PF) SYRINGE
PREFILLED_SYRINGE | INTRAVENOUS | Status: DC | PRN
  Administered 2022-01-09: 70 mg via INTRAVENOUS

## 2022-01-09 MED ORDER — FENTANYL CITRATE (PF) 100 MCG/2ML IJ SOLN: 25.0000 ug | INTRAMUSCULAR | Status: DC | PRN

## 2022-01-09 MED ORDER — FENTANYL CITRATE (PF) 250 MCG/5ML IJ SOLN
INTRAMUSCULAR | Status: DC | PRN
Start: 2022-01-09 — End: 2022-01-09
  Administered 2022-01-09: 100 ug via INTRAVENOUS

## 2022-01-09 MED ORDER — DEXAMETHASONE SODIUM PHOSPHATE 10 MG/ML IJ SOLN
INTRAMUSCULAR | Status: DC | PRN
  Administered 2022-01-09: 10 mg via INTRAVENOUS

## 2022-01-09 MED ORDER — LIDOCAINE 2% (20 MG/ML) 5 ML SYRINGE
INTRAMUSCULAR | Status: DC | PRN
  Administered 2022-01-09: 60 mg via INTRAVENOUS

## 2022-01-09 MED ORDER — CHLORHEXIDINE GLUCONATE 0.12 % MT SOLN: 15.0000 mL | Freq: Once | OROMUCOSAL | Status: AC

## 2022-01-09 MED ORDER — LACTATED RINGERS IV SOLN
INTRAVENOUS | Status: DC
Start: 2022-01-09 — End: 2022-01-09

## 2022-01-09 MED ORDER — PHENYLEPHRINE HCL-NACL 20-0.9 MG/250ML-% IV SOLN
INTRAVENOUS | Status: DC | PRN
  Administered 2022-01-09: 25 ug/min via INTRAVENOUS

## 2022-01-09 MED ORDER — OXYCODONE HCL 5 MG PO TABS: 5.0000 mg | ORAL_TABLET | Freq: Once | ORAL | Status: DC | PRN

## 2022-01-09 MED ORDER — ONDANSETRON HCL 4 MG/2ML IJ SOLN: 4.0000 mg | Freq: Four times a day (QID) | INTRAMUSCULAR | Status: DC | PRN

## 2022-01-09 MED ORDER — FENTANYL CITRATE (PF) 100 MCG/2ML IJ SOLN
INTRAMUSCULAR | Status: AC
  Filled 2022-01-09: qty 2

## 2022-01-09 MED ORDER — OXYCODONE HCL 5 MG/5ML PO SOLN: 5.0000 mg | Freq: Once | ORAL | Status: DC | PRN

## 2022-01-09 SURGICAL SUPPLY — 1 items: SuperLock Fiducial IMPLANT

## 2022-01-09 NOTE — Op Note (Addendum)
Video Bronchoscopy with Robotic Assisted Bronchoscopic Navigation   Date of Operation: 01/09/2022   Pre-op Diagnosis: Left upper lobe PET-avid pulmonary nodule   Post-op Diagnosis: Left upper lobe PET-avid pulmonary nodule  Surgeon: Walker Shadow   Assistants: Londell Moh  Anesthesia: General endotracheal anesthesia  Operation: Flexible video fiberoptic bronchoscopy with robotic assistance and biopsies.  Estimated Blood Loss: Minimal  Complications: None  Indications and History: Harry Young is a 66 y.o. male with history of smoking, bladder cancer, and left upper lobe PET-avid pulmonary nodule. The risks, benefits, complications, treatment options and expected outcomes were discussed with the patient.  The possibilities of pneumothorax, pneumonia, reaction to medication, pulmonary aspiration, perforation of a viscus, bleeding, failure to diagnose a condition and creating a complication requiring transfusion or operation were discussed with the patient who freely signed the consent.    Description of Procedure: The patient was seen in the Preoperative Area, was examined and was deemed appropriate to proceed.  The patient was taken to [facility] endoscopy room 3, identified as Harry Young and the procedure verified as Flexible Video Fiberoptic Bronchoscopy.  A Time Out was held and the above information confirmed.   Prior to the date of the procedure a high-resolution CT scan of the chest was performed. Utilizing ION software program a virtual tracheobronchial tree was generated to allow the creation of distinct navigation pathways to the patient's parenchymal abnormalities. After being taken to the operating room general anesthesia was initiated and the patient  was orally intubated. The video fiberoptic bronchoscope was introduced via the endotracheal tube and a general inspection was performed which showed normal right and left lung anatomy, aspiration of the bilateral mainstems was completed to  remove any remaining secretions. Robotic catheter inserted into patient's endotracheal tube.   Target #1 LUL pulmonary nodule: The distinct navigation pathways prepared prior to this procedure were then utilized to navigate to patient's lesion identified on CT scan. CIOS imaging was used to aid navigation and confirm ideal location for biopsy. The robotic catheter was secured into place and the vision probe was withdrawn.  Lesion location was approximated using fluoroscopy and radial endobronchial ultrasound for peripheral targeting. Under fluoroscopic guidance transbronchial needle brushings, transbronchial needle biopsies, and transbronchial forceps biopsies were performed to be sent for cytology and pathology. Cios imaging used again with 21G needle loaded to confirm location of needle tip in lesion. Needle withdrawn, fiducial marker then placed  under fluoro and bronchioalveolar lavage was performed in the same position and sent for cytology and micro.  At the end of the procedure a general airway inspection was performed and there was no evidence of active bleeding. The bronchoscope was removed.  The patient tolerated the procedure well. There was no significant blood loss and there were no obvious complications. A post-procedural chest x-ray is pending.  Samples Target #1: 1. Transbronchial needle brushings from LUL pulmonary nodule 2. Transbronchial Wang needle biopsies from LUL pulmonary nodule 3. Transbronchial forceps biopsies from LUL pulmonary nodule 4. Bronchoalveolar lavage from LUL 5. Endobronchial biopsies from LUL pulmonary nodule  Plans:  The patient will be discharged from the PACU to home when recovered from anesthesia and after chest x-ray is reviewed. We will review the cytology, pathology and microbiology results with the patient when they become available. Outpatient followup will be with me 01/23/22.

## 2022-01-09 NOTE — Discharge Instructions (Signed)
-   OK to resume aspirin 81 mg daily, fish oil tomorrow - Call [phone] or send me a my chart message if you have any questions about your results

## 2022-01-09 NOTE — Anesthesia Postprocedure Evaluation (Signed)
Anesthesia Post Note  Patient: JAHLANI LORENTZ  Procedure(s) Performed: ROBOTIC ASSISTED NAVIGATIONAL BRONCHOSCOPY (Left) BRONCHIAL BIOPSIES BRONCHIAL BRUSHINGS BRONCHIAL NEEDLE ASPIRATION BIOPSIES     Patient location during evaluation: PACU Anesthesia Type: General Level of consciousness: awake and alert Pain management: pain level controlled Vital Signs Assessment: post-procedure vital signs reviewed and stable Respiratory status: spontaneous breathing, nonlabored ventilation, respiratory function stable and patient connected to nasal cannula oxygen Cardiovascular status: blood pressure returned to baseline and stable Postop Assessment: no apparent nausea or vomiting Anesthetic complications: no   No notable events documented.  Last Vitals:  Vitals:   01/09/22 1400 01/09/22 1415  BP: (!) 122/48 (!) 120/48  Pulse: 63 (!) 57  Resp: 16 14  Temp:  36.6 C  SpO2: 95% 93%    Last Pain:  Vitals:   01/09/22 1415  TempSrc:   PainSc: 0-No pain                 Effie Berkshire

## 2022-01-09 NOTE — Anesthesia Procedure Notes (Signed)
Procedure Name: Intubation Date/Time: 01/09/2022 12:35 PM  Performed by: Reeves Dam, CRNAPre-anesthesia Checklist: Patient identified, Patient being monitored, Timeout performed, Emergency Drugs available and Suction available Patient Re-evaluated:Patient Re-evaluated prior to induction Oxygen Delivery Method: Circle system utilized Preoxygenation: Pre-oxygenation with 100% oxygen Induction Type: IV induction Ventilation: Mask ventilation without difficulty Laryngoscope Size: 3 and Miller Grade View: Grade I Tube type: Oral Tube size: 8.5 mm Number of attempts: 1 Airway Equipment and Method: Stylet Placement Confirmation: ETT inserted through vocal cords under direct vision, positive ETCO2 and breath sounds checked- equal and bilateral Secured at: 22 cm Tube secured with: Tape Dental Injury: Teeth and Oropharynx as per pre-operative assessment

## 2022-01-09 NOTE — Transfer of Care (Signed)
Immediate Anesthesia Transfer of Care Note  Patient: Harry Young  Procedure(s) Performed: ROBOTIC ASSISTED NAVIGATIONAL BRONCHOSCOPY (Left) BRONCHIAL BIOPSIES BRONCHIAL BRUSHINGS BRONCHIAL NEEDLE ASPIRATION BIOPSIES  Patient Location: PACU  Anesthesia Type:General  Level of Consciousness: awake, alert , oriented and patient cooperative  Airway & Oxygen Therapy: Patient Spontanous Breathing  Post-op Assessment: Report given to RN, Post -op Vital signs reviewed and stable and Patient moving all extremities X 4  Post vital signs: Reviewed and stable  Last Vitals:  Vitals Value Taken Time  BP 125/50 01/09/22 1345  Temp 36.6 C 01/09/22 1345  Pulse 76 01/09/22 1349  Resp 16 01/09/22 1349  SpO2 96 % 01/09/22 1349  Vitals shown include unvalidated device data.  Last Pain:  Vitals:   01/09/22 1345  TempSrc:   PainSc: 0-No pain         Complications: No notable events documented.

## 2022-01-09 NOTE — Anesthesia Preprocedure Evaluation (Addendum)
Anesthesia Evaluation  Patient identified by MRN, date of birth, ID band Patient awake    Reviewed: Allergy & Precautions, H&P , NPO status , Patient's Chart, lab work & pertinent test results  Airway Mallampati: II   Neck ROM: full    Dental  (+) Dental Advisory Given, Edentulous Upper, Edentulous Lower   Pulmonary COPD, Current Smoker,    breath sounds clear to auscultation       Cardiovascular hypertension, Pt. on medications + CAD  + Valvular Problems/Murmurs  Rhythm:regular Rate:Normal  TTE (2020): EF 55%, mild AS, mild AI.  Left ventricle cavity is normal in size and wall thickness. Normal global  wall motion. Normal LV systolic function with EF 55%. Indeterminate  diastolic filling pattern. Calculated EF 55%.  Left atrial cavity is mildly dilated.  Probably trileaflet aortic valve. Mild aortic stenosis. Vmax 2.3 m/sec,  mean PG 11 mmHg, AVA 1.6 cm2 by continuity equation. Moderate (Grade II)  aortic regurgitation.  Mild (Grade I) mitral regurgitation.  Trace tricuspid regurgitation. Unable to calculate RV systolic pressure  due to inadequate TR jet. Estimated right atrial pressure 8 mmHg.  No significant changes compared to previous study on 02/05/2021.   Neuro/Psych PSYCHIATRIC DISORDERS Anxiety  Neuromuscular disease    GI/Hepatic   Endo/Other  Hypothyroidism   Renal/GU      Musculoskeletal   Abdominal   Peds  Hematology   Anesthesia Other Findings   Reproductive/Obstetrics                           Anesthesia Physical Anesthesia Plan  ASA: 3  Anesthesia Plan: General   Post-op Pain Management:    Induction: Intravenous  PONV Risk Score and Plan: 1 and Ondansetron, Dexamethasone, Midazolam and Treatment may vary due to age or medical condition  Airway Management Planned: Oral ETT  Additional Equipment: None  Intra-op Plan:   Post-operative Plan: Extubation in  OR  Informed Consent: I have reviewed the patients History and Physical, chart, labs and discussed the procedure including the risks, benefits and alternatives for the proposed anesthesia with the patient or authorized representative who has indicated his/her understanding and acceptance.     Dental advisory given  Plan Discussed with: CRNA, Anesthesiologist and Surgeon  Anesthesia Plan Comments:        Anesthesia Quick Evaluation

## 2022-01-09 NOTE — Interval H&P Note (Signed)
History and Physical Interval Note:  01/09/2022 11:08 AM  Harry Young  has presented today for surgery, with the diagnosis of LEFT UPPER LOBE PULMONARY NODULE.  The various methods of treatment have been discussed with the patient and family. After consideration of risks, benefits and other options for treatment, the patient has consented to  Procedure(s): ROBOTIC ASSISTED NAVIGATIONAL BRONCHOSCOPY (Left) as a surgical intervention.  The patient's history has been reviewed, patient examined, no change in status, stable for surgery.  I have reviewed the patient's chart and labs.  Questions were answered to the patient's satisfaction.     Collene Gobble

## 2022-01-10 LAB — ACID FAST SMEAR (AFB, MYCOBACTERIA): Acid Fast Smear: NEGATIVE

## 2022-01-11 ENCOUNTER — Telehealth: Payer: Self-pay | Admitting: Student

## 2022-01-11 ENCOUNTER — Encounter (HOSPITAL_COMMUNITY): Payer: Self-pay | Admitting: Student

## 2022-01-11 LAB — CULTURE, BAL-QUANTITATIVE W GRAM STAIN
Culture: NO GROWTH
Gram Stain: NONE SEEN

## 2022-01-11 LAB — CULTURE, RESPIRATORY W GRAM STAIN
Culture: NO GROWTH
Gram Stain: NONE SEEN

## 2022-01-11 LAB — CYTOLOGY - NON PAP

## 2022-01-11 NOTE — Telephone Encounter (Signed)
Called to discuss results, left voicemail.

## 2022-01-12 LAB — ANCA SCREEN W REFLEX TITER: ANCA SCREEN: NEGATIVE

## 2022-01-14 LAB — AEROBIC/ANAEROBIC CULTURE W GRAM STAIN (SURGICAL/DEEP WOUND)
Culture: NO GROWTH
Gram Stain: NONE SEEN

## 2022-01-20 NOTE — Progress Notes (Unsigned)
Synopsis: Referred for spiculated 90m LUL pet avid nodule by LTamsen Roers MD  Subjective:   PATIENT ID: Harry CousinsGENDER: male DOB: 217-Aug-1957 MRN: [id number] No chief complaint on file.  66yM with history of smoking, emphysema/COPD, bladder cancer referred for spiculated 875mLUL pet avid nodule picked up on lung cancer screening   He has some DOE takes breo with minor improvement. Rinses mouth/brushes teeth after use. He does have dry cough typically but occasionally has chest congestion. Last course of prednisone for COPD was probably a couple years ago. No hemoptysis. He has had some weight loss which he attributes to having his teeth pulled and change in appetite after stopping gabapentin. No fever. No night sweats. No hematuria.   He has no family history of lung cancer  53py active smoker - hasn't tried any medicines before to help him quit. He worked at [employer] since 2018.   Interval HPI: Started on stiolto last visit, though anoro will likely be more affordable. Robotic bronch unrevealing for etiology of nodule.   Otherwise pertinent review of systems is negative.  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 Dr. JoQuay Burow11/17/16- "Heterogenous plaque- 1-39% RICA stenosis.  Homogenous plaque- 4081-82%ICA stenosis.  >50% LECA stenosis.  Normal subclavian arteries bilaterally.  Patent vertebral arteries with antegrade flow.   Bladder stones    Bladder tumor    Constipation 08/07/2015   COPD (chronic obstructive pulmonary disease) (HCC)    Coronary artery disease involving native coronary artery of native heart without angina pectoris 04/03/2019   Displacement of lumbar intervertebral disc without myelopathy 04/14/2013    Essential (primary) hypertension 04/03/2019   Exertional dyspnea 02/22/2019   Golfer's elbow, right 03/30/2016   History of kidney stones    History of kidney stones    Hyperlipidemia 08/07/2015   Hypothyroidism due to Hashimoto's thyroiditis 07/15/2015   Hypothyroidism, unspecified    Increased urinary frequency 08/07/2015   Joint swelling 08/07/2015   Formatting of this note might be different from the original. Right   Leg weakness 08/07/2015   Low back pain radiating to left leg    Lumbar radiculopathy 10/21/2013   Lumbosacral neuritis 06/24/2014   Lumbosacral radiculitis 04/14/2013   Mixed hyperlipidemia    Neoplasm of bladder 08/07/2015   Formatting of this note might be different from the original. 4 cm   Nocturia    Nonrheumatic aortic valve insufficiency 02/22/2019   Nonrheumatic aortic valve stenosis 02/22/2019   Other chronic pain    Productive cough    Screen for colon cancer 08/07/2015   Smokers' cough ([place]   Smokes 01/03/2018   Spinal stenosis of lumbar region 05/19/2015   Transitional cell carcinoma of bladder ([place]--  DR OTKarsten Ro S/P TURBT X3--  HX BCG TX'S   Urgency of urination    Vitamin D deficiency      Family History  Problem Relation Age of Onset   Hyperlipidemia Mother    Heart disease Father    Heart failure Father    COPD Father    Diabetes Brother      Past Surgical History:  Procedure Laterality Date   BRONCHIAL BIOPSY  01/09/2022   Procedure: BRONCHIAL BIOPSIES;  Surgeon: MeMaryjane HurterMD;  [facility];  Service: Pulmonary;;   BRONCHIAL BRUSHINGS  01/09/2022   Procedure: BRONCHIAL BRUSHINGS;  Surgeon: Maryjane Hurter, MD;  Location: [place];  Service: Pulmonary;;   BRONCHIAL NEEDLE ASPIRATION BIOPSY  01/09/2022   Procedure: BRONCHIAL NEEDLE ASPIRATION BIOPSIES;  Surgeon: Maryjane Hurter, MD;  Location: [place];  Service: Pulmonary;;   CYSTOSCOPY W/ RETROGRADES Bilateral 12/13/2012   Procedure: CYSTOSCOPY WITH  RETROGRADE PYELOGRAM;  Surgeon: Claybon Jabs, MD;  Location[facility];  Service: Urology;  Laterality: Bilateral;   CYSTOSCOPY WITH LITHOLAPAXY N/A 04/04/2013   Procedure: CYSTOSCOPY ;  Surgeon: Claybon Jabs, MD;  Location: [facility];  Service: Urology;  Laterality: N/A;   LUMBAR DISC SURGERY  09-20-2005   L2 -- L5   LUMBAR LAMINECTOMY/DECOMPRESSION MICRODISCECTOMY Left 01/09/2013   Procedure: Left  lumbar four-five microdiskectomy;  Surgeon: Winfield Cunas, MD;  Location: [place] [facility];  Service: Neurosurgery;  Laterality: Left;   LUMBAR WOUND DEBRIDEMENT N/A 01/13/2013   Procedure: LUMBAR WOUND DEBRIDEMENT;  Surgeon: Charlie Pitter, MD;  Location: [place] [facility];  Service: Neurosurgery;  Laterality: N/A;  Lumbar Wound Exploration,  Attempted Placement of Cerebrospinal Drain, and Repair of CSF Leak   TRANSURETHRAL RESECTION OF BLADDER TUMOR  07-05-2009;   11-22-2009   bladder cancer   TRANSURETHRAL RESECTION OF BLADDER TUMOR N/A 12/13/2012   Procedure: TRANSURETHRAL RESECTION OF BLADDER TUMOR (TURBT) AND INSTILLATION OF MYTOMYCIN;  Surgeon: Claybon Jabs, MD;  Location: [facility];  Service: Urology;  Laterality: N/A;   TRANSURETHRAL RESECTION OF BLADDER TUMOR WITH GYRUS (TURBT-GYRUS) N/A 04/04/2013   Procedure: TRANSURETHRAL RESECTION OF BLADDER TUMOR WITH GYRUS (TURBT-GYRUS);  Surgeon: Claybon Jabs, MD;  Location: [facility];  Service: Urology;  Laterality: N/A;    Social History   Socioeconomic History   Marital status: Married    Spouse name: Not on file   Number of children: 0   Years of education: Not on file   Highest education level: Not on file  Occupational History   Not on file  Tobacco Use   Smoking status: Every Day    Packs/day: 1.00    Years: 53.00    Total pack years: 53.00    Types: Cigarettes    Start date: 07/04/1968   Smokeless tobacco: Never  Vaping Use   Vaping Use: Never used  Substance and  Sexual Activity   Alcohol use: No   Drug use: No   Sexual activity: Not on file  Other Topics Concern   Not on file  Social History Narrative   Not on file   Social Determinants of Health   Financial Resource Strain: Not on file  Food Insecurity: Not on file  Transportation Needs: Not on file  Physical Activity: Not on file  Stress: Not on file  Social Connections: Not on file  Intimate Partner Violence: Not on file     Allergies  Allergen Reactions   Crestor [Rosuvastatin] Other (See Comments)    Urinary retention.   Sulfa Antibiotics Rash   Sulfacetamide Sodium Rash   Sulfamethoxazole-Trimethoprim Rash     Outpatient Medications Prior to Visit  Medication Sig Dispense Refill   albuterol (VENTOLIN HFA) 108 (90 Base) MCG/ACT inhaler Inhale 1-2 puffs into the lungs every 6 (six) hours as needed for shortness of breath or wheezing.     amLODipine (NORVASC) 5 MG tablet TAKE 1 TABLET BY MOUTH EVERY DAY 90 tablet 0   aspirin EC  81 MG tablet Take 81 mg by mouth daily at 12 noon.     Cholecalciferol (VITAMIN D3 PO) Take 1 tablet by mouth daily with supper.     ezetimibe (ZETIA) 10 MG tablet Take 10 mg by mouth daily.     HYDROcodone-acetaminophen (NORCO) 10-325 MG tablet Take 1 tablet by mouth 2 (two) times daily as needed (pain.).     levothyroxine (SYNTHROID) 112 MCG tablet Take 112 mcg by mouth daily before breakfast.     Multiple Minerals-Vitamins (CAL MAG ZINC +D3) TABS Take 1 tablet by mouth daily.     Omega-3 Fatty Acids (FISH OIL) 1200 MG CAPS Take 1,200 mg by mouth daily.     sildenafil (VIAGRA) 50 MG tablet Take 50 mg by mouth daily as needed for erectile dysfunction.     Tiotropium Bromide-Olodaterol (STIOLTO RESPIMAT) 2.5-2.5 MCG/ACT AERS Inhale 2 puffs into the lungs daily. 2 each 0   No facility-administered medications prior to visit.       Objective:   Physical Exam:  General appearance: 66 y.o., male, NAD, conversant  Eyes: anicteric sclerae; PERRL,  tracking appropriately HENT: NCAT; MMM Neck: Trachea midline; no lymphadenopathy, no JVD Lungs: CTAB, no crackles, no wheeze, with normal respiratory effort CV: RRR, no murmur  Abdomen: Soft, non-tender; non-distended, BS present  Extremities: No peripheral edema, warm Skin: Normal turgor and texture; no rash Psych: Appropriate affect Neuro: Alert and oriented to person and place, no focal deficit     There were no vitals filed for this visit.    on RA BMI Readings from Last 3 Encounters:  01/09/22 19.51 kg/m  01/05/22 20.15 kg/m  07/28/19 24.51 kg/m   Wt Readings from Last 3 Encounters:  01/09/22 136 lb (61.7 kg)  01/05/22 140 lb 6.4 oz (63.7 kg)  07/28/19 170 lb 12.8 oz (77.5 kg)     CBC    Component Value Date/Time   WBC 7.9 01/09/2022 1020   RBC 4.58 01/09/2022 1020   HGB 14.9 01/09/2022 1020   HCT 43.5 01/09/2022 1020   PLT 174 01/09/2022 1020   MCV 95.0 01/09/2022 1020   MCH 32.5 01/09/2022 1020   MCHC 34.3 01/09/2022 1020   RDW 14.4 01/09/2022 1020   LYMPHSABS 2.3 01/12/2013 2115   MONOABS 1.1 (H) 01/12/2013 2115   EOSABS 0.0 01/12/2013 2115   BASOSABS 0.0 01/12/2013 2115     Chest Imaging: PET/CT with spiculated PET avid LUL nodule 65m, SUV 4.34, no pet avid mediastinal LAD, upper lobe predominant emphysema      Pulmonary Functions Testing Results:     No data to display           TTE 07/27/20: Left ventricle cavity is normal in size and wall thickness. Normal global  wall motion. Normal LV systolic function with EF 55%. Indeterminate  diastolic filling pattern. Calculated EF 55%.  Left atrial cavity is mildly dilated.  Probably trileaflet aortic valve. Mild aortic stenosis. Vmax 2.3 m/sec,  mean PG 11 mmHg, AVA 1.6 cm2 by continuity equation. Moderate (Grade II)  aortic regurgitation.  Mild (Grade I) mitral regurgitation.  Trace tricuspid regurgitation. Unable to calculate RV systolic pressure  due to inadequate TR jet. Estimated  right atrial pressure 8 mmHg.       Assessment & Plan:   # LUL pet avid 849mnodule: Likely primary lung but does have history of bladder cancer and suspicious L renal lesion - about to see urology. Mentions brother with [employer] interestingly.  # Emphysema # Suspected  COPD gold B  # Smoking Cessation encouraged, will revisit at next appointment  Plan: - ANCAs - will sort out date soon for robotic bronchoscopy under general - trial stiolto 2 puffs daily, will clarify least expensive laba/lama - stop breo while on stiolto - RTC 2 weeks to review results of [place], MD [facility] 01/20/2022 8:13 PM

## 2022-01-23 ENCOUNTER — Ambulatory Visit (INDEPENDENT_AMBULATORY_CARE_PROVIDER_SITE_OTHER): Payer: Medicare Other | Admitting: Student

## 2022-01-23 ENCOUNTER — Encounter: Payer: Self-pay | Admitting: Student

## 2022-01-23 VITALS — BP 110/70 | HR 64 | Temp 98.1°F | Ht 70.0 in | Wt 138.4 lb

## 2022-01-23 DIAGNOSIS — Z87891 Personal history of nicotine dependence: Secondary | ICD-10-CM

## 2022-01-23 DIAGNOSIS — R911 Solitary pulmonary nodule: Secondary | ICD-10-CM

## 2022-01-23 DIAGNOSIS — J432 Centrilobular emphysema: Secondary | ICD-10-CM | POA: Diagnosis not present

## 2022-01-23 MED ORDER — ANORO ELLIPTA 62.5-25 MCG/ACT IN AEPB
1.0000 | INHALATION_SPRAY | Freq: Every day | RESPIRATORY_TRACT | 11 refills | Status: DC
Start: 2022-01-23 — End: 2022-05-12

## 2022-01-23 NOTE — Patient Instructions (Addendum)
-   We will schedule PFTs  - Finish stiolto, then switch to breo 1 puff once daily, then once these inhalers are done start anoro 1 puff once daily  - let me know if you have any preference for this vs breo - CT Chest in November and clinic visit after this - Do your best to stop smoking!

## 2022-01-24 ENCOUNTER — Ambulatory Visit
Admission: RE | Admit: 2022-01-24 | Discharge: 2022-01-24 | Disposition: A | Discharge status: Home / Self Care | Payer: Medicare Other | Source: Ambulatory Visit

## 2022-01-24 DIAGNOSIS — Z8551 Personal history of malignant neoplasm of bladder: Secondary | ICD-10-CM

## 2022-01-24 MED ORDER — IOPAMIDOL (ISOVUE-300) INJECTION 61%
80.0000 mL | Freq: Once | INTRAVENOUS | Status: AC | PRN
  Administered 2022-01-24: 80 mL via INTRAVENOUS

## 2022-02-07 ENCOUNTER — Other Ambulatory Visit: Payer: Self-pay | Admitting: Urology

## 2022-02-07 LAB — FUNGUS CULTURE WITH STAIN

## 2022-02-07 LAB — FUNGUS CULTURE RESULT

## 2022-02-07 LAB — FUNGAL ORGANISM REFLEX

## 2022-02-22 LAB — ACID FAST CULTURE WITH REFLEXED SENSITIVITIES (MYCOBACTERIA): Acid Fast Culture: NEGATIVE

## 2022-02-23 NOTE — Progress Notes (Addendum)
COVID Vaccine Completed:  Yes x3  Date of COVID positive in last 90 days:  No  PCP - Deon Pilling, NP Cardiologist - Vernell Leep, MD  Chest x-ray - 01-09-22 Epic EKG - 01-09-22 Epic Stress Test - 2020 Epic ECHO - 07-27-20 Epic Cardiac Cath -  N/A Pacemaker/ICD device last checked: Spinal Cord Stimulator: N/A  Bowel Prep - N/A  Sleep Study - N/A CPAP -   Fasting Blood Sugar - N/A Checks Blood Sugar _____ times a day  Blood Thinner Instructions: Aspirin Instructions:  ASA 81.  Patient states he is to stop 2 days before  Last Dose:  Activity level:  Can go up a flight of stairs and perform activities of daily living without stopping and without symptoms of chest pain.  Patient has  shortness of breath with exertion.  He states that this has been an ongoing issue due to COPD, has not worsened.  Anesthesia review: Aortic valve stenosis, aortic valve insufficiency, HTN, CAD, bil carotid stenosis, COPD  Patient denies shortness of breath, fever, cough and chest pain at PAT appointment  Patient verbalized understanding of instructions that were given to them at the PAT appointment. Patient was also instructed that they will need to review over the PAT instructions again at home before surgery.

## 2022-02-23 NOTE — Patient Instructions (Addendum)
SURGICAL WAITING ROOM VISITATION Patients having surgery or a procedure may have no more than 2 support people in the waiting area - these visitors may rotate.   Children under the age of 10 must have an adult with them who is not the patient. If the patient needs to stay at the hospital during part of their recovery, the visitor guidelines for inpatient rooms apply. Pre-op nurse will coordinate an appropriate time for 1 support person to accompany patient in pre-op.  This support person may not rotate.    Please refer to the [facility] website for the visitor guidelines for Inpatients (after your surgery is over and you are in a regular room).      Your procedure is scheduled on: 02-28-22   Report to [facility] Main Entrance    Report to admitting at 10:15 AM   Call this number if you have problems the morning of surgery [phone]   Do not eat food :After Midnight.   After Midnight you may have the following liquids until 9:30 AM DAY OF SURGERY  Water Non-Citrus Juices (without pulp, NO RED) Carbonated Beverages Black Coffee (NO MILK/CREAM OR CREAMERS, sugar ok)  Clear Tea (NO MILK/CREAM OR CREAMERS, sugar ok) regular and decaf                             Plain Jell-O (NO RED)                                           Fruit ices (not with fruit pulp, NO RED)                                     Popsicles (NO RED)                                                               Sports drinks like Gatorade (NO RED)                      If you have questions, please contact your surgeon's office.   FOLLOW  ANY ADDITIONAL PRE OP INSTRUCTIONS YOU RECEIVED FROM YOUR SURGEON'S OFFICE!!!     Oral Hygiene is also important to reduce your risk of infection.                                    Remember - BRUSH YOUR TEETH THE MORNING OF SURGERY WITH YOUR REGULAR TOOTHPASTE   Do NOT smoke after Midnight   Take these medicines the morning of surgery with A SIP OF  WATER: Amlodipine Ezetimbe Levothyroxine Hydrocodone if needed Okay to use inhalers                               You may not have any metal on your body including  jewelry, and body piercing  Do not wear  lotions, powders, cologne, or deodorant              Men may shave face and neck.   Do not bring valuables to the hospital. [place].   Contacts, dentures or bridgework may not be worn into surgery.  DO NOT [place]. PHARMACY WILL DISPENSE MEDICATIONS LISTED ON YOUR MEDICATION LIST TO YOU DURING YOUR ADMISSION [place]!    Patients discharged on the day of surgery will not be allowed to drive home.  Someone NEEDS to stay with you for the first 24 hours after anesthesia.              Please read over the following fact sheets you were given: IF [place]  If you received a COVID test during your pre-op visit  it is requested that you wear a mask when out in public, stay away from anyone that may not be feeling well and notify your surgeon if you develop symptoms. If you test positive for Covid or have been in contact with anyone that has tested positive in the last 10 days please notify you surgeon.  [place] - Preparing for Surgery Before surgery, you can play an important role.  Because skin is not sterile, your skin needs to be as free of germs as possible.  You can reduce the number of germs on your skin by washing with CHG (chlorahexidine gluconate) soap before surgery.  CHG is an antiseptic cleaner which kills germs and bonds with the skin to continue killing germs even after washing. Please DO NOT use if you have an allergy to CHG or antibacterial soaps.  If your skin becomes reddened/irritated stop using the CHG and inform your nurse when you arrive at Short Stay. Do not shave (including legs and underarms) for at  least 48 hours prior to the first CHG shower.  You may shave your face/neck.  Please follow these instructions carefully:  1.  Shower with CHG Soap the night before surgery and the  morning of surgery.  2.  If you choose to wash your hair, wash your hair first as usual with your normal  shampoo.  3.  After you shampoo, rinse your hair and body thoroughly to remove the shampoo.                             4.  Use CHG as you would any other liquid soap.  You can apply chg directly to the skin and wash.  Gently with a scrungie or clean washcloth.  5.  Apply the CHG Soap to your body ONLY FROM THE NECK DOWN.   Do   not use on face/ open                           Wound or open sores. Avoid contact with eyes, ears mouth and   genitals (private parts).                       Wash face,  Genitals (private parts) with your normal soap.             6.  Wash thoroughly, paying special attention to the area where your    surgery  will be performed.  7.  Thoroughly rinse your body with warm water from the neck down.  8.  DO NOT shower/wash with your normal soap after using and rinsing off the CHG Soap.                9.  Pat yourself dry with a clean towel.            10.  Wear clean pajamas.            11.  Place clean sheets on your bed the night of your first shower and do not  sleep with pets. Day of Surgery : Do not apply any lotions/deodorants the morning of surgery.  Please wear clean clothes to the hospital/surgery center.  FAILURE TO FOLLOW THESE INSTRUCTIONS MAY RESULT IN THE CANCELLATION OF YOUR SURGERY  PATIENT SIGNATURE_________________________________  NURSE SIGNATURE__________________________________  ________________________________________________________________________

## 2022-02-24 ENCOUNTER — Encounter (HOSPITAL_COMMUNITY): Payer: Self-pay

## 2022-02-24 ENCOUNTER — Other Ambulatory Visit: Payer: Self-pay

## 2022-02-24 ENCOUNTER — Encounter (HOSPITAL_COMMUNITY)
Admission: RE | Admit: 2022-02-24 | Discharge: 2022-02-24 | Disposition: A | Discharge status: Home / Self Care | Payer: Medicare Other | Source: Ambulatory Visit | Attending: Urology | Admitting: Urology

## 2022-02-24 VITALS — BP 119/59 | HR 58 | Temp 98.5°F | Resp 16 | Ht 70.0 in | Wt 137.4 lb

## 2022-02-24 DIAGNOSIS — I10 Essential (primary) hypertension: Secondary | ICD-10-CM | POA: Diagnosis not present

## 2022-02-24 DIAGNOSIS — I6523 Occlusion and stenosis of bilateral carotid arteries: Secondary | ICD-10-CM | POA: Insufficient documentation

## 2022-02-24 DIAGNOSIS — I350 Nonrheumatic aortic (valve) stenosis: Secondary | ICD-10-CM | POA: Insufficient documentation

## 2022-02-24 DIAGNOSIS — J449 Chronic obstructive pulmonary disease, unspecified: Secondary | ICD-10-CM | POA: Insufficient documentation

## 2022-02-24 DIAGNOSIS — I2510 Atherosclerotic heart disease of native coronary artery without angina pectoris: Secondary | ICD-10-CM | POA: Insufficient documentation

## 2022-02-24 DIAGNOSIS — N2889 Other specified disorders of kidney and ureter: Secondary | ICD-10-CM | POA: Diagnosis not present

## 2022-02-24 DIAGNOSIS — Z01818 Encounter for other preprocedural examination: Secondary | ICD-10-CM | POA: Diagnosis not present

## 2022-02-24 HISTORY — DX: Dyspnea, unspecified: R06.00

## 2022-02-24 LAB — BASIC METABOLIC PANEL
Anion gap: 6 (ref 5–15)
BUN: 13 mg/dL (ref 8–23)
CO2: 28 mmol/L (ref 22–32)
Calcium: 9.2 mg/dL (ref 8.9–10.3)
Chloride: 105 mmol/L (ref 98–111)
Creatinine, Ser: 1.13 mg/dL (ref 0.61–1.24)
GFR, Estimated: >60 mL/min (ref >60)
Glucose, Bld: 103 mg/dL — ABNORMAL HIGH (ref 70–99)
Potassium: 4.5 mmol/L (ref 3.5–5.1)
Sodium: 139 mmol/L (ref 135–145)

## 2022-02-27 NOTE — Progress Notes (Signed)
Anesthesia Chart Review   Case: [id number] Date/Time: 02/28/22 1215   Procedure: CYSTOSCOPY WITH  BILATERAL RETROGRADE PYELOGRAM, LEFT  URETEROSCOPY  WITH POSSIBLE LEFT RENAL BIOPSY AND POSSIBLE LEFT AND STENT PLACEMENT (Bilateral) - 1 HR   Anesthesia type: General   Pre-op diagnosis: RENAL MASS   Location: [facility] 03 / [facility]   Surgeons: Remi Haggard, MD       DISCUSSION:66 y.o. smoker with h/o COPD, HTN, CAD, mild aortic stenosis, carotid stenosis, renal mass scheduled for above procedure 02/28/2022 with Dr. Harold Barban.   Pt s/p bronchoscopy 01/09/2022. Last seen by pulmonology 01/23/2022.  Per OV note hemoptysis post bronch resolved, experiencing occasional chest congestion and productive cough.  VS: BP (!) 119/59   Pulse (!) 58   Temp 36.9 C (Oral)   Resp 16   Ht '5\' 10"'$  (1.778 m)   Wt 62.3 kg   SpO2 97%   BMI 19.71 kg/m   PROVIDERS: Deon Pilling, NP is PCP   Cardiologist - Vernell Leep, MD  Maryjane Hurter, MD is Pulmonologist  LABS: Labs reviewed: Acceptable for surgery. (all labs ordered are listed, but only abnormal results are displayed)  Labs Reviewed  BASIC METABOLIC PANEL - Abnormal; Notable for the following components:      Result Value   Glucose, Bld 103 (*)    All other components within normal limits     IMAGES: Carotid artery duplex  02/06/19  Doppler flow velocities in the bilateral proximal internal carotid artery  are consistent with stenosis in the range of 16-49% with moderate  heterogeneous plaque.  Stenosis in the left common carotid artery (<50%). Left external carotid  <50% stenosis.  Antegrade right vertebral artery flow. Antegrade left vertebral artery  flow.  Follow up in one year is appropriate if clinically indicated.  EKG:   CV: Echocardiogram 07/27/2020:  Left ventricle cavity is normal in size and wall thickness. Normal global  wall motion. Normal LV systolic function with EF 55%. Indeterminate  diastolic  filling pattern. Calculated EF 55%.  Left atrial cavity is mildly dilated.  Probably trileaflet aortic valve. Mild aortic stenosis. Vmax 2.3 m/sec,  mean PG 11 mmHg, AVA 1.6 cm2 by continuity equation. Moderate (Grade II)  aortic regurgitation.  Mild (Grade I) mitral regurgitation.  Trace tricuspid regurgitation. Unable to calculate RV systolic pressure  due to inadequate TR jet. Estimated right atrial pressure 8 mmHg.  No significant changes compared to previous study on 02/05/2021.   Lexiscan Tetrofosmin Stress Test  03/24/2019: Nondiagnostic ECG stress. The LV is mildly dilated both in rest and stress images with LV end diastolic volume of 841 mL. Mild degree medium extent perfusion consistent with mild (reversible) ischemia located in the mid inferoseptal wall and basal inferoseptal wall (Right Coronary Artery region) of left ventricle. Stress LV EF: 41%.  Intermediate risk study.  No previous exam available for comparison. 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 Dr. Quay Burow- 04/01/15- "Heterogenous plaque- 1-39% RICA stenosis.  Homogenous plaque- 32-44% LICA stenosis.  >50% LECA stenosis.  Normal subclavian arteries bilaterally.  Patent vertebral arteries with antegrade flow.   Bladder stones    Bladder tumor    Constipation 08/07/2015   COPD (chronic obstructive pulmonary disease) (HCC)    Coronary artery disease  involving native coronary artery of native heart without angina pectoris 04/03/2019   Displacement of lumbar intervertebral disc without myelopathy 04/14/2013   Dyspnea    Essential (primary) hypertension 04/03/2019   Exertional dyspnea 02/22/2019   Golfer's elbow, right 03/30/2016   History of kidney stones    History of kidney stones     Hyperlipidemia 08/07/2015   Hypothyroidism due to Hashimoto's thyroiditis 07/15/2015   Hypothyroidism, unspecified    Increased urinary frequency 08/07/2015   Joint swelling 08/07/2015   Formatting of this note might be different from the original. Right   Leg weakness 08/07/2015   Low back pain radiating to left leg    Lumbar radiculopathy 10/21/2013   Lumbosacral neuritis 06/24/2014   Lumbosacral radiculitis 04/14/2013   Mixed hyperlipidemia    Neoplasm of bladder 08/07/2015   Formatting of this note might be different from the original. 4 cm   Nocturia    Nonrheumatic aortic valve insufficiency 02/22/2019   Nonrheumatic aortic valve stenosis 02/22/2019   Other chronic pain    Productive cough    Screen for colon cancer 08/07/2015   Smokers' cough ([place])    Smokes 01/03/2018   Spinal stenosis of lumbar region 05/19/2015   Transitional cell carcinoma of bladder ([place]) UROLOGIST--  DR Karsten Ro   S/P TURBT X3--  HX BCG TX'S   Urgency of urination    Vitamin D deficiency     Past Surgical History:  Procedure Laterality Date   BRONCHIAL BIOPSY  01/09/2022   Procedure: BRONCHIAL BIOPSIES;  Surgeon: Maryjane Hurter, MD;  Location: [place];  Service: Pulmonary;;   BRONCHIAL BRUSHINGS  01/09/2022   Procedure: BRONCHIAL BRUSHINGS;  Surgeon: Maryjane Hurter, MD;  Location: [place];  Service: Pulmonary;;   BRONCHIAL NEEDLE ASPIRATION BIOPSY  01/09/2022   Procedure: BRONCHIAL NEEDLE ASPIRATION BIOPSIES;  Surgeon: Maryjane Hurter, MD;  Location[facility] ENDOSCOPY;  Service: Pulmonary;;   CYSTOSCOPY W/ RETROGRADES Bilateral 12/13/2012   Procedure: CYSTOSCOPY WITH RETROGRADE PYELOGRAM;  Surgeon: Claybon Jabs, MD;  Location: [place];  Service: Urology;  Laterality: Bilateral;   CYSTOSCOPY WITH LITHOLAPAXY N/A 04/04/2013   Procedure: CYSTOSCOPY ;  Surgeon: Claybon Jabs, MD;  Location: [facility];  Service: Urology;  Laterality: N/A;   LUMBAR DISC  SURGERY  09-20-2005   L2 -- L5   LUMBAR LAMINECTOMY/DECOMPRESSION MICRODISCECTOMY Left 01/09/2013   Procedure: Left  lumbar four-five microdiskectomy;  Surgeon: Winfield Cunas, MD;  Location: [place] [facility];  Service: Neurosurgery;  Laterality: Left;   LUMBAR WOUND DEBRIDEMENT N/A 01/13/2013   Procedure: LUMBAR WOUND DEBRIDEMENT;  Surgeon: Charlie Pitter, MD;  Location: [place] [facility];  Service: Neurosurgery;  Laterality: N/A;  Lumbar Wound Exploration,  Attempted Placement of Cerebrospinal Drain, and Repair of CSF Leak   TRANSURETHRAL RESECTION OF BLADDER TUMOR  07-05-2009;   11-22-2009   bladder cancer   TRANSURETHRAL RESECTION OF BLADDER TUMOR N/A 12/13/2012   Procedure: TRANSURETHRAL RESECTION OF BLADDER TUMOR (TURBT) AND INSTILLATION OF MYTOMYCIN;  Surgeon: Claybon Jabs, MD;  Location: [facility];  Service: Urology;  Laterality: N/A;   TRANSURETHRAL RESECTION OF BLADDER TUMOR WITH GYRUS (TURBT-GYRUS) N/A 04/04/2013   Procedure: TRANSURETHRAL RESECTION OF BLADDER TUMOR WITH GYRUS (TURBT-GYRUS);  Surgeon: Claybon Jabs, MD;  Location: [facility];  Service: Urology;  Laterality: N/A;    MEDICATIONS:  albuterol (VENTOLIN HFA) 108 (90 Base) MCG/ACT inhaler   amLODipine (NORVASC) 5 MG tablet   aspirin EC 81  MG tablet   Cholecalciferol (VITAMIN D3) 25 MCG (1000 UT) CAPS   ezetimibe (ZETIA) 10 MG tablet   HYDROcodone-acetaminophen (NORCO) 10-325 MG tablet   levothyroxine (SYNTHROID) 112 MCG tablet   Multiple Minerals-Vitamins (CAL MAG ZINC +D3) TABS   Omega-3 Fatty Acids (FISH OIL) 1200 MG CAPS   sildenafil (VIAGRA) 50 MG tablet   umeclidinium-vilanterol (ANORO ELLIPTA) 62.5-25 MCG/ACT AEPB   No current facility-administered medications for this encounter.     Konrad Felix Ward, PA-C [facility] [phone]

## 2022-02-27 NOTE — H&P (Signed)
had bladder cancer that has been treated.  HPI: Harry Young is a 66 year-old male established patient who is here for follow-up of bladder cancer.   His bladder cancer was superficial and limitied to the bladder lining.   He did have a TURBT. His last bladder tumor was resected 07/05/2009. He has had the following number of bladder resections: 3. He did not have radiation to treat his bladder cancer. He had treatment with the following intravesical agents: BCG and Mitomycin. Patient denies Interferon, Adriamycin, Epirubicin, and Gemcitabine.   He has not had blood in his urine recently. The patient denies any progressive voiding symptoms. He is not having new bone pain. He has not recently had unwanted weight loss.   His last cysto was 03/08/2018.   This condition would be considered of mild to moderate severity with no modifying factors or associated signs or symptoms other than as noted above.   Transitional cell carcinoma of the bladder: Presenting symptom - gross hematuria. CT scan 06/20/09 - 4 cm bladder tumor on the posterior right wall with no evidence of adenopathy or hydronephrosis.  TURBT 07/05/09 - tumor fully resected including partial resection of right ureteral orifice.  Pathology: TCCa noninvasive, moderate grade (Ta,G2/3)  BCG - 6 week induction course, full strength completed 5/11  Second TURBT 11/22/09 - posterior wall lesion and a second lesion at the anterior bladder neck region.  Pathology: Ta,G2/3  Second BCG - 6 week induction course completed 8/11.  Recurrence 8/14: He underwent TURBT with postoperative mitomycin-C.  Pathology: Ta,G3  Third course of BCG - completed 2/15.   03/06/19: He returns for routine annual surveillance cystoscopy. He is not had any new urinary symptoms. He has not had any hematuria. He reports that he does get his PSA checked and prostate examined by his primary care provider at [facility] and that his PSA has remained normal.   -03/01/20-patient  with history of high-grade superficial transitional cell carcinoma of the bladder originally resected back in February 2011. He has had 2 recurrences last 1 in July of 2011. He has undergone 3 rounds of BCG last 1 completed in February 2015. Last surveillance cysto in October 2020 was clear. No interim GU issues other than some urgency but is fairly well controlled. A. Here for follow-up surveillance cysto and cytology.  Urinalysis today is clear on urine spun sediment  Cystoscopy is performed today and shows no recurrent bladder lesions..   -03/03/21-patient with history of high-grade superficial transitional cell carcinoma of the bladder recently resected in February 2011. Last recurrence was in July 2011. Has had 3 rounds of BCG. Had negative follow-up surveillance cystoscopy in October 2021. No interim GU issues here for follow-up surveillance cystoscopy. Patient had recent lab work done which showed serum creatinine of 1.6 at PCP office. No imaging has been performed.  Cystoscopy is performed today and shows: Normal bladder without mucosal  -02/03/22-patient with history of high-grade superficial transitional cell carcinoma of the bladder with last documented recurrence February 2011. Had negative surveillance cystoscopy in October 2022. Patient recently admitted to the hospital to undergo bronchoscopy for lung lesion. He also had a CT scan of the abdomen pelvis at that time which shows a probable filling defect in the left renal pelvis extending into the upper infundibulum suspicious for upper tract neoplasm. See report below. Here for further management.    had bladder cancer that has been treated.  HPI: Harry Young is a 66 year-old male established patient who is here  for follow-up of bladder cancer.   His bladder cancer was superficial and limitied to the bladder lining.   He did have a TURBT. His last bladder tumor was resected 07/05/2009. He has had the following number of bladder resections:  3. He did not have radiation to treat his bladder cancer. He had treatment with the following intravesical agents: BCG and Mitomycin. Patient denies Interferon, Adriamycin, Epirubicin, and Gemcitabine.   He has not had blood in his urine recently. The patient denies any progressive voiding symptoms. He is not having new bone pain. He has not recently had unwanted weight loss.   His last cysto was 03/08/2018.   This condition would be considered of mild to moderate severity with no modifying factors or associated signs or symptoms other than as noted above.   Transitional cell carcinoma of the bladder: Presenting symptom - gross hematuria. CT scan 06/20/09 - 4 cm bladder tumor on the posterior right wall with no evidence of adenopathy or hydronephrosis.  TURBT 07/05/09 - tumor fully resected including partial resection of right ureteral orifice.  Pathology: TCCa noninvasive, moderate grade (Ta,G2/3)  BCG - 6 week induction course, full strength completed 5/11  Second TURBT 11/22/09 - posterior wall lesion and a second lesion at the anterior bladder neck region.  Pathology: Ta,G2/3  Second BCG - 6 week induction course completed 8/11.  Recurrence 8/14: He underwent TURBT with postoperative mitomycin-C.  Pathology: Ta,G3  Third course of BCG - completed 2/15.   03/06/19: He returns for routine annual surveillance cystoscopy. He is not had any new urinary symptoms. He has not had any hematuria. He reports that he does get his PSA checked and prostate examined by his primary care provider at [facility] and that his PSA has remained normal.   -03/01/20-patient with history of high-grade superficial transitional cell carcinoma of the bladder originally resected back in February 2011. He has had 2 recurrences last 1 in July of 2011. He has undergone 3 rounds of BCG last 1 completed in February 2015. Last surveillance cysto in October 2020 was clear. No interim GU issues other than some urgency but is  fairly well controlled. A. Here for follow-up surveillance cysto and cytology.  Urinalysis today is clear on urine spun sediment  Cystoscopy is performed today and shows no recurrent bladder lesions..   -03/03/21-patient with history of high-grade superficial transitional cell carcinoma of the bladder recently resected in February 2011. Last recurrence was in July 2011. Has had 3 rounds of BCG. Had negative follow-up surveillance cystoscopy in October 2021. No interim GU issues here for follow-up surveillance cystoscopy. Patient had recent lab work done which showed serum creatinine of 1.6 at PCP office. No imaging has been performed.  Cystoscopy is performed today and shows: Normal bladder without mucosal  -02/03/22-patient with history of high-grade superficial transitional cell carcinoma the bladder with last documented recurrence in February 2011. Had negative surveillance cystoscopy in October 22. Recently found on CT scan to have questionable filling defect in the left upper pole of the kidney. Subsequently had hematuria CT scan on 01/24/2022 which suggests filling defect in the upper pole infundibulum highly suspicious for urothelial carcinoma. See report below. The patient denies any flank pain or gross hematuria.   CLINICAL DATA: Further evaluation of left renal abnormality seen on  prior imaging. * Tracking Code: BO *     EXAM:  CT ABDOMEN AND PELVIS WITHOUT AND WITH CONTRAST     TECHNIQUE:  Multidetector CT imaging of the abdomen  and pelvis was performed  following the standard protocol before and following the bolus  administration of intravenous contrast.     RADIATION DOSE REDUCTION: This exam was performed according to the  departmental dose-optimization program which includes automated  exposure control, adjustment of the mA and/or kV according to  patient size and/or use of iterative reconstruction technique.     CONTRAST: 72m ISOVUE-300 IOPAMIDOL (ISOVUE-300) INJECTION 61%      COMPARISON: PET-CT December 07, 2021 and chest CT November 23, 2021.     FINDINGS:  Lower chest: No acute abnormality.     Hepatobiliary: Enhancing foci in the peripheral left lobe of the  liver measures 7 mm on image 10/9 and 7 mm on image 13/9 with these  equilibrate to background liver on delayed imaging likely reflecting  intrahepatic shunts or flash filling hemangiomas but incompletely  evaluated on this examination. Gallbladder is unremarkable. No  biliary ductal dilation.     Pancreas: No pancreatic ductal dilation or evidence of acute  inflammation.     Spleen: No splenomegaly.     Adrenals/Urinary Tract: Bilateral adrenal glands appear normal.     No hydronephrosis. No renal, ureteral or bladder calculi identified.     Enhancing filling defect involving the left renal pelvis and  extending into upper and interpolar pyramids/calices for instance on  image 67/20 and measuring 2.0 x 1.3 cm in maximum axial diameter on  image 26/9.     No suspicious right-sided filling defect identified. No suspicious  filling defect identified within the ureters although the  proximal/mid left and distal right ureters are not well opacified  limiting evaluation.     Urinary bladder is unremarkable for degree of distension without  asymmetric wall thickening or suspicious intraluminal filling defect  identified.     Stomach/Bowel: No radiopaque enteric contrast material was  administered. Stomach is unremarkable for degree of distension. No  pathologic dilation of small or large bowel. No evidence of acute  bowel inflammation. Colonic diverticulosis without findings of acute  diverticulitis.     Vascular/Lymphatic: Aortic atherosclerosis. No pathologically  enlarged abdominal or pelvic lymph nodes.     Reproductive: Prostate is unremarkable.     Other: No significant abdominopelvic free fluid.     Musculoskeletal: No aggressive lytic or blastic lesion of bone.     IMPRESSION:  1.  Enhancing filling defect in the left renal pelvis extending into  the upper/interpolar renal calices highly suspicious for upper tract  urothelial neoplasm. Urology consult suggested.  2. No evidence of lower tract disease. Evaluation of the ureters is  limited by incomplete opacification.  3. No evidence of abdominopelvic metastatic disease.  4. Aortic Atherosclerosis (ICD10-I70.0).     These results will be called to the ordering clinician or  representative by the Radiologist Assistant, and communication  documented in the PACS or [employer]        Electronically Signed  By: JDahlia BailiffM.D.  On: 01/25/2022 14:25     ALLERGIES: Septra TABS    MEDICATIONS: Amlodipine Besylate 5 mg tablet  Breo Ellipta  Calcium 600-Vit D3-Mineral  Hydrocodone-Acetaminophen 7.5 mg-325 mg tablet  Levothyroxine Sodium 88 mcg tablet Oral  Sildenafil Citrate  Xanax     GU PSH: Bladder Instill AntiCA Agent - 2014 Cysto Remove Stent FB Com - 2014 Cystoscopy - 03/03/2021, 2020, 2019, 2018, 2018, 2017 Cystoscopy TURBT <2 cm - 2014 Cystoscopy TURBT 2-5 cm - 2014       PSH Notes: Cystoscopy With Fulguration  Small Lesion (5-40m), Cystoscopy With Complicated Removal Of Object, Back Surgery, Bladder Injection Of Cancer Treatment, Cystoscopy With Fulguration Medium Lesion (2-5cm), Back Surgery, Spinal Diskectomy, Cystoscopy Bladder Tumor, Cystoscopy Bladder Tumor   NON-GU PSH: No Non-GU PSH    GU PMH: History of bladder cancer - 03/03/2021, - 03/01/2020 (Stable), He had no evidence of recurrent transitional cell carcinoma of the bladder noted cystoscopically today. I will continue annual surveillance cystoscopy., - 2019 (Stable), He has no evidence of recurrent transitional cell carcinoma of the bladder noted cystoscopically today and it has now been 4 years since his last recurrence. I will therefore see him for surveillance cystoscopy again in 1 year., - 2018 (Stable), He had no evidence of  recurrent transitional cell carcinoma of the bladder noted cystoscopically today. He will return again in 6 months for repeat surveillance cystoscopy., - 2018 (Stable), He had no evidence of recurrent transitional cell carcinoma of the bladder. I will therefore see him again in 6 months for surveillance cystoscopy., - 2017 Overactive bladder - 03/03/2021, - 03/01/2020, Overactive bladder, - 2015 Encounter for Prostate Cancer screening (Stable), His prostate was noted to be smooth and benign. He is going to check with his primary PSA is being done on an annual basis. - 2019, Prostate cancer screening, - 2014 Post-void dribbling (Stable), His only voiding symptom is that of postvoid dribbling which she has had and we discussed the benign nature of this. - 2018 Bladder Cancer, Unspec, Transitional cell carcinoma of bladder - 2017 Other Disorders Of Bladder, Dystrophic calcification of bladder - 2017 Personal Hx Oth Urinary System diseases, History of urethral stricture - 2015 History of urolithiasis, Nephrolithiasis - 2014      PMH Notes:  2012-12-10 09:52:43 - Note: Cellulitis   NON-GU PMH: Encounter for general adult medical examination without abnormal findings, Encounter for preventive health examination - 2017 Asthma, Asthma - 2014 Personal history of other diseases of the respiratory system, History of chronic bronchitis - 2014    FAMILY HISTORY: Congestive Heart Failure - Runs In Family, Runs In Family Death In The Family Father - Runs In Family   SOCIAL HISTORY: Marital Status: Married Preferred Language: English; Ethnicity: Not Hispanic Or Latino; Race: White Current Smoking Status: Patient smokes.  Does drink.  Drinks 1 caffeinated drink per day.     Notes: Current every day smoker, Tobacco use, Marital History - Currently Married, Alcohol Use, Caffeine Use   REVIEW OF SYSTEMS:    GU Review Male:   Patient reports frequent urination and get up at night to urinate. Patient denies  hard to postpone urination, burning/ pain with urination, leakage of urine, stream starts and stops, trouble starting your stream, have to strain to urinate , erection problems, and penile pain.  Gastrointestinal (Upper):   Patient denies nausea, vomiting, and indigestion/ heartburn.  Gastrointestinal (Lower):   Patient denies diarrhea and constipation.  Constitutional:   Patient denies weight loss, fatigue, night sweats, and fever.  Skin:   Patient denies skin rash/ lesion and itching.  Eyes:   Patient denies blurred vision and double vision.  Ears/ Nose/ Throat:   Patient denies sore throat and sinus problems.  Hematologic/Lymphatic:   Patient denies swollen glands and easy bruising.  Cardiovascular:   Patient denies leg swelling and chest pains.  Respiratory:   Patient denies cough and shortness of breath.  Endocrine:   Patient denies excessive thirst.  Musculoskeletal:   Patient reports back pain. Patient denies joint pain.  Neurological:   Patient denies  headaches and dizziness.  Psychologic:   Patient denies depression and anxiety.   Notes: Updated from previous visit 03/03/2021 with review from patient as noted above.   VITAL SIGNS: None   Complexity of Data:  Source Of History:  Patient  Records Review:   Previous Doctor Records, Previous Hospital Records, Previous Patient Records  Urine Test Review:   Urinalysis   12/18/18  PSA  Total PSA 1.31 ng/dl    PROCEDURES:          Urinalysis - 81003 Dipstick Dipstick Cont'd  Color: Yellow Bilirubin: Neg  Appearance: Clear Ketones: Neg  Specific Gravity: 1.015 Blood: Neg  pH: 6.5 Protein: Neg  Glucose: Neg Urobilinogen: 0.2    Nitrites: Neg    Leukocyte Esterase: Neg    ASSESSMENT:      ICD-10 Details  1 GU:   History of bladder cancer - Z85.51 Chronic, Stable  2   Abnormal radiologic findings on diagnostic imaging of other urinary organs - H88.50 Acute, Complicated Injury  3   Left renal neoplasm - Y77.412 Acute,  Complicated Injury     PLAN:           Document Letter(s):  Created for Patient: Clinical Summary         Notes:   Reviewed CT findings with the patient. The  CT issuggestive of urothelial neoplasm in the renal pelvis and extending into the upper pole calyx. Recommended cystoscopy bilateral retrogrades attempt at flexible ureteroscopy and pyeloscopy with possible renal biopsy. We will schedule accordingly in the near future.

## 2022-02-27 NOTE — Anesthesia Preprocedure Evaluation (Addendum)
Anesthesia Evaluation  Patient identified by MRN, date of birth, ID band Patient awake    Reviewed: Allergy & Precautions, NPO status , Patient's Chart, lab work & pertinent test results  History of Anesthesia Complications Negative for: history of anesthetic complications  Airway Mallampati: II  TM Distance: >3 FB Neck ROM: Full    Dental  (+) Edentulous Upper, Edentulous Lower   Pulmonary COPD, Current Smoker,    Pulmonary exam normal        Cardiovascular hypertension, Pt. on medications + CAD  Normal cardiovascular exam  TTE 07/27/2020: EF 55%, mild LAE, mild AS, moderate AR, mild MR   Neuro/Psych Anxiety    GI/Hepatic negative GI ROS, Neg liver ROS,   Endo/Other  Hypothyroidism   Renal/GU negative Renal ROSLeft renal mass  negative genitourinary   Musculoskeletal negative musculoskeletal ROS (+)   Abdominal   Peds  Hematology negative hematology ROS (+)   Anesthesia Other Findings Day of surgery medications reviewed with patient.  Reproductive/Obstetrics negative OB ROS                           Anesthesia Physical Anesthesia Plan  ASA: 3  Anesthesia Plan: General   Post-op Pain Management: Tylenol PO (pre-op)*   Induction: Intravenous  PONV Risk Score and Plan: 1 and Treatment may vary due to age or medical condition, Ondansetron and Dexamethasone  Airway Management Planned: LMA  Additional Equipment: None  Intra-op Plan:   Post-operative Plan: Extubation in OR  Informed Consent: I have reviewed the patients History and Physical, chart, labs and discussed the procedure including the risks, benefits and alternatives for the proposed anesthesia with the patient or authorized representative who has indicated his/her understanding and acceptance.       Plan Discussed with: CRNA  Anesthesia Plan Comments: (See PAT note 02/24/2022)      Anesthesia Quick  Evaluation

## 2022-02-28 ENCOUNTER — Other Ambulatory Visit: Payer: Self-pay

## 2022-02-28 ENCOUNTER — Encounter (HOSPITAL_COMMUNITY): Payer: Self-pay | Admitting: Urology

## 2022-02-28 ENCOUNTER — Ambulatory Visit (HOSPITAL_COMMUNITY): Payer: Medicare Other | Admitting: Physician Assistant

## 2022-02-28 ENCOUNTER — Ambulatory Visit (HOSPITAL_COMMUNITY)
Admission: RE | Admit: 2022-02-28 | Discharge: 2022-02-28 | Disposition: A | Discharge status: Home / Self Care | Payer: Medicare Other | Attending: Urology | Admitting: Urology

## 2022-02-28 ENCOUNTER — Ambulatory Visit (HOSPITAL_COMMUNITY): Payer: Medicare Other

## 2022-02-28 ENCOUNTER — Encounter (HOSPITAL_COMMUNITY)
Admission: RE | Disposition: A | Discharge status: Home / Self Care | Payer: Self-pay | Source: Home / Self Care | Attending: Urology

## 2022-02-28 ENCOUNTER — Ambulatory Visit (HOSPITAL_BASED_OUTPATIENT_CLINIC_OR_DEPARTMENT_OTHER): Payer: Medicare Other | Admitting: Anesthesiology

## 2022-02-28 DIAGNOSIS — I10 Essential (primary) hypertension: Secondary | ICD-10-CM | POA: Diagnosis not present

## 2022-02-28 DIAGNOSIS — F17210 Nicotine dependence, cigarettes, uncomplicated: Secondary | ICD-10-CM | POA: Diagnosis not present

## 2022-02-28 DIAGNOSIS — C679 Malignant neoplasm of bladder, unspecified: Secondary | ICD-10-CM | POA: Insufficient documentation

## 2022-02-28 DIAGNOSIS — N2889 Other specified disorders of kidney and ureter: Secondary | ICD-10-CM

## 2022-02-28 DIAGNOSIS — I2510 Atherosclerotic heart disease of native coronary artery without angina pectoris: Secondary | ICD-10-CM | POA: Diagnosis not present

## 2022-02-28 DIAGNOSIS — J449 Chronic obstructive pulmonary disease, unspecified: Secondary | ICD-10-CM | POA: Diagnosis not present

## 2022-02-28 DIAGNOSIS — F419 Anxiety disorder, unspecified: Secondary | ICD-10-CM | POA: Diagnosis not present

## 2022-02-28 DIAGNOSIS — E039 Hypothyroidism, unspecified: Secondary | ICD-10-CM | POA: Insufficient documentation

## 2022-02-28 HISTORY — PX: CYSTOSCOPY WITH RETROGRADE PYELOGRAM, URETEROSCOPY AND STENT PLACEMENT: SHX5789

## 2022-02-28 SURGERY — CYSTOURETEROSCOPY, WITH RETROGRADE PYELOGRAM AND STENT INSERTION
Anesthesia: General | Laterality: Bilateral

## 2022-02-28 MED ORDER — FENTANYL CITRATE (PF) 100 MCG/2ML IJ SOLN
INTRAMUSCULAR | Status: AC
  Filled 2022-02-28: qty 2

## 2022-02-28 MED ORDER — FENTANYL CITRATE (PF) 100 MCG/2ML IJ SOLN
INTRAMUSCULAR | Status: DC | PRN
  Administered 2022-02-28: 50 ug via INTRAVENOUS

## 2022-02-28 MED ORDER — ONDANSETRON HCL 4 MG/2ML IJ SOLN
INTRAMUSCULAR | Status: DC | PRN
  Administered 2022-02-28: 4 mg via INTRAVENOUS

## 2022-02-28 MED ORDER — GLYCOPYRROLATE 0.2 MG/ML IJ SOLN
INTRAMUSCULAR | Status: DC | PRN
  Administered 2022-02-28: .2 mg via INTRAVENOUS

## 2022-02-28 MED ORDER — CHLORHEXIDINE GLUCONATE 0.12 % MT SOLN
15.0000 mL | Freq: Once | OROMUCOSAL | Status: AC
  Administered 2022-02-28: 15 mL via OROMUCOSAL

## 2022-02-28 MED ORDER — SODIUM CHLORIDE 0.9 % IR SOLN
Status: DC | PRN
  Administered 2022-02-28: 3000 mL via INTRAVESICAL

## 2022-02-28 MED ORDER — PROPOFOL 10 MG/ML IV BOLUS
INTRAVENOUS | Status: AC
  Filled 2022-02-28: qty 20

## 2022-02-28 MED ORDER — FENTANYL CITRATE PF 50 MCG/ML IJ SOSY: 25.0000 ug | PREFILLED_SYRINGE | INTRAMUSCULAR | Status: DC | PRN

## 2022-02-28 MED ORDER — OXYCODONE HCL 5 MG/5ML PO SOLN: 5.0000 mg | Freq: Once | ORAL | Status: DC | PRN

## 2022-02-28 MED ORDER — PROPOFOL 10 MG/ML IV BOLUS
INTRAVENOUS | Status: DC | PRN
  Administered 2022-02-28: 170 mg via INTRAVENOUS

## 2022-02-28 MED ORDER — MIDAZOLAM HCL 5 MG/5ML IJ SOLN
INTRAMUSCULAR | Status: DC | PRN
  Administered 2022-02-28 (×2): 1 mg via INTRAVENOUS

## 2022-02-28 MED ORDER — IOHEXOL 300 MG/ML  SOLN
INTRAMUSCULAR | Status: DC | PRN
  Administered 2022-02-28: 25 mL

## 2022-02-28 MED ORDER — 0.9 % SODIUM CHLORIDE (POUR BTL) OPTIME
TOPICAL | Status: DC | PRN
  Administered 2022-02-28: 1000 mL

## 2022-02-28 MED ORDER — GLYCOPYRROLATE 0.2 MG/ML IJ SOLN
INTRAMUSCULAR | Status: AC
  Filled 2022-02-28: qty 1

## 2022-02-28 MED ORDER — EPHEDRINE 5 MG/ML INJ
INTRAVENOUS | Status: AC
  Filled 2022-02-28: qty 5

## 2022-02-28 MED ORDER — LIDOCAINE HCL (CARDIAC) PF 100 MG/5ML IV SOSY
PREFILLED_SYRINGE | INTRAVENOUS | Status: DC | PRN
  Administered 2022-02-28 (×2): 50 mg via INTRAVENOUS

## 2022-02-28 MED ORDER — ACETAMINOPHEN 500 MG PO TABS
1000.0000 mg | ORAL_TABLET | Freq: Once | ORAL | Status: AC
  Administered 2022-02-28: 1000 mg via ORAL
  Filled 2022-02-28: qty 2

## 2022-02-28 MED ORDER — TRAMADOL HCL 50 MG PO TABS: 50.0000 mg | ORAL_TABLET | Freq: Four times a day (QID) | ORAL | 0 refills | Status: DC | PRN

## 2022-02-28 MED ORDER — ORAL CARE MOUTH RINSE: 15.0000 mL | Freq: Once | OROMUCOSAL | Status: AC

## 2022-02-28 MED ORDER — DEXMEDETOMIDINE HCL IN NACL 80 MCG/20ML IV SOLN
INTRAVENOUS | Status: DC | PRN
  Administered 2022-02-28: 8 ug via BUCCAL

## 2022-02-28 MED ORDER — CEFAZOLIN SODIUM-DEXTROSE 2-4 GM/100ML-% IV SOLN
2.0000 g | INTRAVENOUS | Status: AC
  Administered 2022-02-28: 2 g via INTRAVENOUS
  Filled 2022-02-28: qty 100

## 2022-02-28 MED ORDER — EPHEDRINE SULFATE (PRESSORS) 50 MG/ML IJ SOLN
INTRAMUSCULAR | Status: DC | PRN
  Administered 2022-02-28: 10 mg via INTRAVENOUS

## 2022-02-28 MED ORDER — MIDAZOLAM HCL 2 MG/2ML IJ SOLN
INTRAMUSCULAR | Status: AC
  Filled 2022-02-28: qty 2

## 2022-02-28 MED ORDER — LACTATED RINGERS IV SOLN: INTRAVENOUS | Status: DC

## 2022-02-28 MED ORDER — OXYCODONE HCL 5 MG PO TABS: 5.0000 mg | ORAL_TABLET | Freq: Once | ORAL | Status: DC | PRN

## 2022-02-28 SURGICAL SUPPLY — 22 items
BAG URO CATCHER STRL LF (MISCELLANEOUS) ×1 IMPLANT
BASKET ZERO TIP NITINOL 2.4FR (BASKET) IMPLANT
BSKT STON RTRVL ZERO TP 2.4FR (BASKET)
BULB IRRIG PATHFIND (MISCELLANEOUS) ×1 IMPLANT
CATH URETL OPEN 5X70 (CATHETERS) IMPLANT
CLOTH BEACON ORANGE TIMEOUT ST (SAFETY) ×1 IMPLANT
EXTRACTOR STONE 1.7FRX115CM (UROLOGICAL SUPPLIES) IMPLANT
GLOVE SURG LX STRL 7.5 STRW (GLOVE) ×1 IMPLANT
GOWN STRL REUS W/ TWL XL LVL3 (GOWN DISPOSABLE) ×1 IMPLANT
GOWN STRL REUS W/TWL XL LVL3 (GOWN DISPOSABLE) ×1
GUIDEWIRE ANG ZIPWIRE 038X150 (WIRE) IMPLANT
GUIDEWIRE STR DUAL SENSOR (WIRE) ×1 IMPLANT
KIT TURNOVER KIT A (KITS) IMPLANT
LASER FIB FLEXIVA PULSE ID 365 (Laser) IMPLANT
MANIFOLD NEPTUNE II (INSTRUMENTS) ×1 IMPLANT
PACK CYSTO (CUSTOM PROCEDURE TRAY) ×1 IMPLANT
SHEATH NAVIGATOR HD 11/13X36 (SHEATH) IMPLANT
SYR 20ML LL LF (SYRINGE) ×1 IMPLANT
TRACTIP FLEXIVA PULS ID 200XHI (Laser) IMPLANT
TRACTIP FLEXIVA PULSE ID 200 (Laser)
TUBING CONNECTING 10 (TUBING) ×1 IMPLANT
TUBING UROLOGY SET (TUBING) ×1 IMPLANT

## 2022-02-28 NOTE — Anesthesia Procedure Notes (Signed)
Procedure Name: LMA Insertion Date/Time: 02/28/2022 1:03 PM  Performed by: Garrel Ridgel, CRNAPre-anesthesia Checklist: Patient identified, Emergency Drugs available, Suction available and Patient being monitored Patient Re-evaluated:Patient Re-evaluated prior to induction Oxygen Delivery Method: Circle system utilized Preoxygenation: Pre-oxygenation with 100% oxygen Induction Type: IV induction Ventilation: Mask ventilation without difficulty LMA: LMA inserted LMA Size: 4.0 Tube type: Oral Number of attempts: 1 Placement Confirmation: ETT inserted through vocal cords under direct vision, positive ETCO2 and breath sounds checked- equal and bilateral Tube secured with: Tape Dental Injury: Teeth and Oropharynx as per pre-operative assessment

## 2022-02-28 NOTE — Interval H&P Note (Signed)
History and Physical Interval Note:  02/28/2022 10:39 AM  Harry Young  has presented today for surgery, with the diagnosis of RENAL MASS.  The various methods of treatment have been discussed with the patient and family. After consideration of risks, benefits and other options for treatment, the patient has consented to  Procedure(s) with comments: CYSTOSCOPY WITH  BILATERAL RETROGRADE PYELOGRAM, LEFT  URETEROSCOPY  WITH POSSIBLE LEFT RENAL BIOPSY AND POSSIBLE LEFT AND STENT PLACEMENT (Bilateral) - 1 HR as a surgical intervention.  The patient's history has been reviewed, patient examined, no change in status, stable for surgery.  I have reviewed the patient's chart and labs.  Questions were answered to the patient's satisfaction.     Remi Haggard

## 2022-02-28 NOTE — Anesthesia Postprocedure Evaluation (Signed)
Anesthesia Post Note  Patient: Harry Young  Procedure(s) Performed: CYSTOSCOPY WITH RETROGRADE PYELOGRAM, URETEROSCOPY, LEFT STENT PLACEMENT (Bilateral)     Patient location during evaluation: PACU Anesthesia Type: General Level of consciousness: awake and alert Pain management: pain level controlled Vital Signs Assessment: post-procedure vital signs reviewed and stable Respiratory status: spontaneous breathing, nonlabored ventilation and respiratory function stable Cardiovascular status: blood pressure returned to baseline Postop Assessment: no apparent nausea or vomiting Anesthetic complications: no   No notable events documented.  Last Vitals:  Vitals:   02/28/22 1415 02/28/22 1430  BP: (!) 111/56 (!) 107/54  Pulse: 68 (!) 53  Resp: 11 10  Temp:  (!) 36.3 C  SpO2: 100% 95%    Last Pain:  Vitals:   02/28/22 1430  TempSrc:   PainSc: 0-No pain                 Marthenia Rolling

## 2022-02-28 NOTE — Op Note (Signed)
Preoperative diagnosis:  1.  Left collecting system renal mass  Postoperative diagnosis: 1.  Left collecting system renal mass  Procedure(s): 1.  Cystoscopy, left retrograde pyelogram with intraoperative interpretation, left flexible ureteroscopy attempted left pyeloscopy, saline barbotage with collection of left renal collecting system urine cytology, insertion left JJ stent  Surgeon: Dr. Harold Barban  Anesthesia: General  Complications: None  EBL: Minimal  Specimens: Urine cytology from left renal pelvis  Disposition of specimens: To pathology  Intraoperative findings: Obvious filling defect involving the renal pelvis and left upper pole calyx.  Narrowed portion of mid ureter unable to pass ureteral access sheath through this area but able to pass it to mid ureter.  Attempted to pass flexible scope through this area over guidewire was unsuccessful.  Cytology was obtained utilizing open tip catheter from the renal pelvis with saline barbotage.  6 [place] by 26 cm Percuflex plus soft contour stent placed  Indication: 66 year old white male who has history of high-grade transitional cell carcinoma the bladder status post TURBT with BCG installations in the past.  He was found on recent lung screening CT scan to have what appeared to be possible filling defect in the left upper pole of the kidney.  Subsequent CT renal scan has confirmed filling defects highly suspicious for transitional cell tumor in the left kidney and upper calyx and collecting system.  Presents this time to go undergo cystoscopy retrograde attempt at flexible ureteroscopy and pyeloscopy and possible renal biopsy.  Description of procedure:  After obtaining informed consent for the patient he was taken the major cystoscopy suite placed under general anesthesia.  Placed in the dorsolithotomy position genitalia prepped and draped in usual sterile fashion.  Proper pause and timeout was performed.  67 French cystoscope was  advanced in the bladder.  Bladder was inspected with 30 and 70 degree lenses reveals no mucosal lesions.  There were several scarred areas from prior TURBT.  A 5 French open tip catheter was utilized performed a retrograde pyelogram on the left side.  This revealed some narrowing in the mid left ureter however no obvious filling defect.  There is filling defect noted in the left upper calyx and portion of the renal pelvis suspicious for probable urothelial tumor.  Sensor wire was passed up to the left renal pelvis without difficulty.  Ureteral access sheath was then attempted be passed.  First pass the obturator which went along its length.  I attempted to pass the access sheath along with the access sheath and obturator but was unable to get past the more narrow portion of the ureter in the mid upper ureter and therefore the access sheath would only go to the mid ureter.  I remove the obturator advanced the flexible scope through the access sheath and advanced to the narrowed area in the left ureter.  I was unable to get the scope to pass through this area.  Did not appear to have any sort of tumor but just a narrowed spot.  Passed a guidewire through the flexible scope and again tried to pass the scope over the guidewire but was unable to negotiate over this area.  At this point elected to place JJ stent with plan of perhaps returning in a week or so after passive ureteral dilation to try and get up to the renal pelvis.  Did not feel comfortable trying to balloon dilate this area.  I did perform retrograde pyelogram through this area with no extravasation of contrast and good integrity of the  collecting system.  I subsequently backed the ureteroscope out along with access sheath leaving the guidewire in place.  Past 5 French open tip catheter over the wire up to the renal pelvis.  Utilizing saline barbotage I collected urine cytology from the renal pelvis and upper calyceal area.  There was some bloody drainage  after irrigation noted.  Open tip catheter was removed the wire was back fed through the cystoscope.  6 [place] by 26 cm Percuflex plus soft contour stent was placed leaving a proximal coil in the renal pelvis and a distal coil in the bladder.  There was flow of pink urine through and around the stent noted.  Bladder was emptied procedure terminated.  He was awake from anesthesia and taken back to recovery in stable condition.  No immediate complication from the procedure.

## 2022-02-28 NOTE — Transfer of Care (Signed)
Immediate Anesthesia Transfer of Care Note  Patient: Harry Young  Procedure(s) Performed: CYSTOSCOPY WITH RETROGRADE PYELOGRAM, URETEROSCOPY, LEFT STENT PLACEMENT (Bilateral)  Patient Location: PACU  Anesthesia Type:General  Level of Consciousness: oriented, drowsy and patient cooperative  Airway & Oxygen Therapy: Patient Spontanous Breathing and Patient connected to face mask oxygen  Post-op Assessment: Report given to RN and Post -op Vital signs reviewed and stable  Post vital signs: Reviewed and stable  Last Vitals:  Vitals Value Taken Time  BP 123/69 02/28/22 1400  Temp 36.3 C 02/28/22 1400  Pulse 67 02/28/22 1403  Resp 9 02/28/22 1403  SpO2 100 % 02/28/22 1403  Vitals shown include unvalidated device data.  Last Pain:  Vitals:   02/28/22 1400  TempSrc:   PainSc: 0-No pain         Complications: No notable events documented.

## 2022-03-01 ENCOUNTER — Encounter (HOSPITAL_COMMUNITY): Payer: Self-pay | Admitting: Urology

## 2022-03-02 LAB — CYTOLOGY - NON PAP

## 2022-03-22 ENCOUNTER — Other Ambulatory Visit: Payer: Self-pay | Admitting: Urology

## 2022-03-28 ENCOUNTER — Other Ambulatory Visit: Payer: Medicare Other

## 2022-03-29 ENCOUNTER — Ambulatory Visit
Admission: RE | Admit: 2022-03-29 | Discharge: 2022-03-29 | Disposition: A | Discharge status: Home / Self Care | Payer: Medicare Other | Source: Ambulatory Visit | Attending: Student | Admitting: Student

## 2022-03-29 DIAGNOSIS — R911 Solitary pulmonary nodule: Secondary | ICD-10-CM

## 2022-03-29 DIAGNOSIS — Z87891 Personal history of nicotine dependence: Secondary | ICD-10-CM

## 2022-04-20 NOTE — Progress Notes (Addendum)
COVID Vaccine Completed:yes  Date of COVID positive in last 90 days: no  PCP - Deon Pilling, NP Cardiologist - Vernell Leep, MD  Chest x-ray - 01/10/22 Epic EKG - 01/09/22 Epic Stress Test - 03/24/19 Epic ECHO - 07/27/20 Epic Cardiac Cath - n/a Pacemaker/ICD device last checked: n/a Spinal Cord Stimulator: n/a  Bowel Prep - mag citrate and clears day before  Sleep Study - n/a CPAP -   Fasting Blood Sugar - n/a Checks Blood Sugar _____ times a day  Last dose of GLP1 agonist-  N/A GLP1 instructions:  N/A   Last dose of SGLT-2 inhibitors-  N/A SGLT-2 instructions: N/A   Blood Thinner Instructions: Aspirin Instructions: ASA 81, haven't taken since stent placement (over a month per pt) Last Dose:  Activity level: Can go up a flight of stairs and perform activities of daily living without stopping and without symptoms of chest pain. SOB, pt has COPD reports no changes   Anesthesia review: HTN, aortic stenosis, CAD, COPD, heart murmur   Patient denies fever, cough and chest pain at PAT appointment  Patient verbalized understanding of instructions that were given to them at the PAT appointment. Patient was also instructed that they will need to review over the PAT instructions again at home before surgery.

## 2022-04-20 NOTE — Patient Instructions (Addendum)
SURGICAL WAITING ROOM VISITATION Patients having surgery or a procedure may have no more than 2 support people in the waiting area - these visitors may rotate.   Children under the age of 20 must have an adult with them who is not the patient. If the patient needs to stay at the hospital during part of their recovery, the visitor guidelines for inpatient rooms apply. Pre-op nurse will coordinate an appropriate time for 1 support person to accompany patient in pre-op.  This support person may not rotate.    Please refer to the [facility] website for the visitor guidelines for Inpatients (after your surgery is over and you are in a regular room).    Your procedure is scheduled on: 05/10/22   Report to [facility] Main Entrance    Report to admitting at 10:30 AM   Call this number if you have problems the morning of surgery [phone]   Follow a clear liquid diet the day before surgery    Do not eat food or drink liquids :After Midnight.          If you have questions, please contact your surgeon's office.   FOLLOW BOWEL PREP AND ANY ADDITIONAL PRE OP INSTRUCTIONS YOU RECEIVED FROM YOUR SURGEON'S OFFICE!!!     Oral Hygiene is also important to reduce your risk of infection.                                    Remember - BRUSH YOUR TEETH THE MORNING OF SURGERY WITH YOUR REGULAR TOOTHPASTE  DENTURES WILL BE REMOVED PRIOR TO SURGERY PLEASE DO NOT APPLY "Poly grip" OR ADHESIVES!!!   Do NOT smoke after Midnight   Take these medicines the morning of surgery with A SIP OF WATER: Inhalers, Amlodipine, Zetia, Norco, Levothyroxine,Tramadol                               You may not have any metal on your body including jewelry, and body piercing             Do not wear lotions, powders, cologne, or deodorant              Men may shave face and neck.   Do not bring valuables to the hospital. [place].   Contacts, glasses,  dentures or bridgework may not be worn into surgery.   Bring small overnight bag day of surgery.   DO NOT [place]. PHARMACY WILL DISPENSE MEDICATIONS LISTED ON YOUR MEDICATION LIST TO YOU DURING YOUR ADMISSION [place]!              Please read over the following fact sheets you were given: IF [place] [phone] Schneiders   If you received a COVID test during your pre-op visit  it is requested that you wear a mask when out in public, stay away from anyone that may not be feeling well and notify your surgeon if you develop symptoms. If you test positive for Covid or have been in contact with anyone that has tested positive in the last 10 days please notify you surgeon.    [place] - Preparing for Surgery Before surgery,  you can play an important role.  Because skin is not sterile, your skin needs to be as free of germs as possible.  You can reduce the number of germs on your skin by washing with CHG (chlorahexidine gluconate) soap before surgery.  CHG is an antiseptic cleaner which kills germs and bonds with the skin to continue killing germs even after washing. Please DO NOT use if you have an allergy to CHG or antibacterial soaps.  If your skin becomes reddened/irritated stop using the CHG and inform your nurse when you arrive at Short Stay. Do not shave (including legs and underarms) for at least 48 hours prior to the first CHG shower.  You may shave your face/neck.  Please follow these instructions carefully:  1.  Shower with CHG Soap the night before surgery and the  morning of surgery.  2.  If you choose to wash your hair, wash your hair first as usual with your normal  shampoo.  3.  After you shampoo, rinse your hair and body thoroughly to remove the shampoo.                             4.  Use CHG as you would any other liquid soap.  You can apply chg directly to the skin and wash.  Gently  with a scrungie or clean washcloth.  5.  Apply the CHG Soap to your body ONLY FROM THE NECK DOWN.   Do   not use on face/ open                           Wound or open sores. Avoid contact with eyes, ears mouth and   genitals (private parts).                       Wash face,  Genitals (private parts) with your normal soap.             6.  Wash thoroughly, paying special attention to the area where your    surgery  will be performed.  7.  Thoroughly rinse your body with warm water from the neck down.  8.  DO NOT shower/wash with your normal soap after using and rinsing off the CHG Soap.                9.  Pat yourself dry with a clean towel.            10.  Wear clean pajamas.            11.  Place clean sheets on your bed the night of your first shower and do not  sleep with pets. Day of Surgery : Do not apply any lotions/deodorants the morning of surgery.  Please wear clean clothes to the hospital/surgery center.  FAILURE TO FOLLOW THESE INSTRUCTIONS MAY RESULT IN THE CANCELLATION OF YOUR SURGERY  PATIENT SIGNATURE_________________________________  NURSE SIGNATURE__________________________________  ________________________________________________________________________

## 2022-04-23 NOTE — Progress Notes (Unsigned)
Synopsis: Referred for spiculated 43m LUL pet avid nodule by LTamsen Roers MD  Subjective:   PATIENT ID: Harry CousinsGENDER: male DOB: [birth year] MRN: [id number] No chief complaint on file.  66yM with history of smoking, emphysema/COPD, bladder cancer referred for spiculated 879mLUL pet avid nodule picked up on lung cancer screening   He has some DOE takes breo with minor improvement. Rinses mouth/brushes teeth after use. He does have dry cough typically but occasionally has chest congestion. Last course of prednisone for COPD was probably a couple years ago. No hemoptysis. He has had some weight loss which he attributes to having his teeth pulled and change in appetite after stopping gabapentin. No fever. No night sweats. No hematuria.   He has no family history of lung cancer  53py active smoker - hasn't tried any medicines before to help him quit. He worked at [employer] since 2018.   Interval HPI: Started on stiolto last visit, though anoro will likely be more affordable. Robotic bronch unrevealing for etiology of nodule.   He says he's doing ok, he doesn't really notice much difference with stiolto symptomatically over and above breo (breo about the same price as anoro).   Hemoptysis post bronch cleared up. Occasional chest congestion and productive cough,. -------------------------------- Started on breo and then anoro last visit  PFTs  LDCT Chest with decreased size of LUL nodule 03/31/22  Otherwise pertinent review of systems is negative.  Past Medical History:  Diagnosis Date   Anxiety disorder, unspecified    Asymmetrical hearing loss of left ear 01/03/2018   Bilateral carotid artery stenosis 08/07/2015   Dr. JoQuay Burow11/17/16- "Heterogenous plaque- 1-39% RICA stenosis.  Homogenous plaque- 4048-54%ICA stenosis.  >50% LECA stenosis.  Normal subclavian arteries bilaterally.  Patent vertebral arteries with antegrade flow.    Constipation 08/07/2015   COPD (chronic obstructive pulmonary disease) (HCC)    Coronary artery disease involving native coronary artery of native heart without angina pectoris 04/03/2019   Displacement of lumbar intervertebral disc without myelopathy 04/14/2013   Dyspnea    Essential (primary) hypertension 04/03/2019   Golfer's elbow, right 03/30/2016   History of kidney stones    Hyperlipidemia 08/07/2015   Hypothyroidism due to Hashimoto's thyroiditis 07/15/2015   Leg weakness 08/07/2015   Lumbar radiculopathy 10/21/2013   Nocturia    Nonrheumatic aortic valve insufficiency 02/22/2019   Nonrheumatic aortic valve stenosis 02/22/2019   Spinal stenosis of lumbar region 05/19/2015   Transitional cell [place] of bladder ([place]--  DR OTKarsten Ro S/P TURBT X3--  HX BCG TX'S   Vitamin D deficiency      Family History  Problem Relation Age of Onset   Hyperlipidemia Mother    Heart disease Father    Heart failure Father    COPD Father    Diabetes Brother      Past Surgical History:  Procedure Laterality Date   BRONCHIAL BIOPSY  01/09/2022   Procedure: BRONCHIAL BIOPSIES;  Surgeon: MeMaryjane HurterMD;  [facility];  Service: Pulmonary;;   BRONCHIAL BRUSHINGS  01/09/2022   Procedure: BRONCHIAL BRUSHINGS;  Surgeon: MeMaryjane HurterMD;  [facility];  Service: Pulmonary;;   BRONCHIAL NEEDLE ASPIRATION BIOPSY  01/09/2022   Procedure: BRONCHIAL NEEDLE ASPIRATION BIOPSIES;  Surgeon: MeMaryjane HurterMD;  [facility];  Service: Pulmonary;;   CYSTOSCOPY W/ RETROGRADES Bilateral 12/13/2012   Procedure: CYSTOSCOPY WITH RETROGRADE PYELOGRAM;  Surgeon: MaClaybon JabsMD;  Location:  [place];  Service: Urology;  Laterality: Bilateral;   CYSTOSCOPY WITH LITHOLAPAXY N/A 04/04/2013   Procedure: CYSTOSCOPY ;  Surgeon: Claybon Jabs, MD;  Location: [facility];  Service: Urology;  Laterality: N/A;   CYSTOSCOPY WITH  RETROGRADE PYELOGRAM, URETEROSCOPY AND STENT PLACEMENT Bilateral 02/28/2022   Procedure: CYSTOSCOPY WITH RETROGRADE PYELOGRAM, URETEROSCOPY, LEFT STENT PLACEMENT;  Surgeon: Remi Haggard, MD;  [facility];  Service: Urology;  Laterality: Bilateral;  1 HR   LUMBAR DISC SURGERY  09-20-2005   L2 -- L5   LUMBAR LAMINECTOMY/DECOMPRESSION MICRODISCECTOMY Left 01/09/2013   Procedure: Left  lumbar four-five microdiskectomy;  Surgeon: Winfield Cunas, MD;  Location: [place] [facility];  Service: Neurosurgery;  Laterality: Left;   LUMBAR WOUND DEBRIDEMENT N/A 01/13/2013   Procedure: LUMBAR WOUND DEBRIDEMENT;  Surgeon: Charlie Pitter, MD;  Location: [place] [facility];  Service: Neurosurgery;  Laterality: N/A;  Lumbar Wound Exploration,  Attempted Placement of Cerebrospinal Drain, and Repair of CSF Leak   TRANSURETHRAL RESECTION OF BLADDER TUMOR  07-05-2009;   11-22-2009   bladder cancer   TRANSURETHRAL RESECTION OF BLADDER TUMOR N/A 12/13/2012   Procedure: TRANSURETHRAL RESECTION OF BLADDER TUMOR (TURBT) AND INSTILLATION OF MYTOMYCIN;  Surgeon: Claybon Jabs, MD;  [facility];  Service: Urology;  Laterality: N/A;   TRANSURETHRAL RESECTION OF BLADDER TUMOR WITH GYRUS (TURBT-GYRUS) N/A 04/04/2013   Procedure: TRANSURETHRAL RESECTION OF BLADDER TUMOR WITH GYRUS (TURBT-GYRUS);  Surgeon: Claybon Jabs, MD;  Location[facility];  Service: Urology;  Laterality: N/A;    Social History   Socioeconomic History   Marital status: Married    Spouse name: Not on file   Number of children: 0   Years of education: Not on file   Highest education level: Not on file  Occupational History   Not on file  Tobacco Use   Smoking status: Every Day    Packs/day: 1.00    Years: 53.00    Total pack years: 53.00    Types: Cigarettes    Start date: 07/04/1968   Smokeless tobacco: Never  Vaping Use   Vaping Use: Never used  Substance and Sexual Activity   Alcohol use: No   Drug use: No    Sexual activity: Not on file  Other Topics Concern   Not on file  Social History Narrative   Not on file   Social Determinants of Health   Financial Resource Strain: Not on file  Food Insecurity: Not on file  Transportation Needs: Not on file  Physical Activity: Not on file  Stress: Not on file  Social Connections: Not on file  Intimate Partner Violence: Not on file     Allergies  Allergen Reactions   Crestor [Rosuvastatin] Other (See Comments)    Urinary retention.   Sulfa Antibiotics Rash     Outpatient Medications Prior to Visit  Medication Sig Dispense Refill   albuterol (VENTOLIN HFA) 108 (90 Base) MCG/ACT inhaler Inhale 1-2 puffs into the lungs every 6 (six) hours as needed for shortness of breath or wheezing.     amLODipine (NORVASC) 5 MG tablet TAKE 1 TABLET BY MOUTH EVERY DAY 90 tablet 0   aspirin EC 81 MG tablet Take 81 mg by mouth daily at 12 noon.     Cholecalciferol (VITAMIN D3) 25 MCG (1000 UT) CAPS Take 1,000 Units by mouth daily with supper.     ezetimibe (ZETIA) 10 MG tablet Take 10 mg by mouth daily.  HYDROcodone-acetaminophen (NORCO) 10-325 MG tablet Take 1 tablet by mouth at bedtime as needed for moderate pain.     levothyroxine (SYNTHROID) 112 MCG tablet Take 112 mcg by mouth daily before breakfast.     Multiple Minerals-Vitamins (CAL MAG ZINC +D3) TABS Take 1 tablet by mouth daily.     Omega-3 Fatty Acids (FISH OIL) 1200 MG CAPS Take 1,200 mg by mouth daily.     sildenafil (VIAGRA) 50 MG tablet Take 50 mg by mouth daily as needed for erectile dysfunction.     traMADol (ULTRAM) 50 MG tablet Take 1 tablet (50 mg total) by mouth every 6 (six) hours as needed. 20 tablet 0   umeclidinium-vilanterol (ANORO ELLIPTA) 62.5-25 MCG/ACT AEPB Inhale 1 puff into the lungs daily. 60 each 11   No facility-administered medications prior to visit.       Objective:   Physical Exam:  General appearance: 66 y.o., male, NAD, conversant  Eyes: anicteric sclerae;  PERRL, tracking appropriately HENT: NCAT; MMM Neck: Trachea midline; no lymphadenopathy, no JVD Lungs: CTAB, no crackles, no wheeze, with normal respiratory effort CV: RRR, no murmur  Abdomen: Soft, non-tender; non-distended, BS present  Extremities: No peripheral edema, warm Skin: Normal turgor and texture; no rash Psych: Appropriate affect Neuro: Alert and oriented to person and place, no focal deficit     There were no vitals filed for this visit.     on RA BMI Readings from Last 3 Encounters:  02/28/22 19.61 kg/m  02/24/22 19.71 kg/m  01/23/22 19.86 kg/m   Wt Readings from Last 3 Encounters:  02/28/22 136 lb 11 oz (62 kg)  02/24/22 137 lb 6.4 oz (62.3 kg)  01/23/22 138 lb 6.4 oz (62.8 kg)     CBC    Component Value Date/Time   WBC 7.9 01/09/2022 1020   RBC 4.58 01/09/2022 1020   HGB 14.9 01/09/2022 1020   HCT 43.5 01/09/2022 1020   PLT 174 01/09/2022 1020   MCV 95.0 01/09/2022 1020   MCH 32.5 01/09/2022 1020   MCHC 34.3 01/09/2022 1020   RDW 14.4 01/09/2022 1020   LYMPHSABS 2.3 01/12/2013 2115   MONOABS 1.1 (H) 01/12/2013 2115   EOSABS 0.0 01/12/2013 2115   BASOSABS 0.0 01/12/2013 2115     Chest Imaging: PET/CT with spiculated PET avid LUL nodule 53m, SUV 4.34, no pet avid mediastinal LAD, upper lobe predominant emphysema      Pulmonary Functions Testing Results:     No data to display           TTE 07/27/20: Left ventricle cavity is normal in size and wall thickness. Normal global  wall motion. Normal LV systolic function with EF 55%. Indeterminate  diastolic filling pattern. Calculated EF 55%.  Left atrial cavity is mildly dilated.  Probably trileaflet aortic valve. Mild aortic stenosis. Vmax 2.3 m/sec,  mean PG 11 mmHg, AVA 1.6 cm2 by continuity equation. Moderate (Grade II)  aortic regurgitation.  Mild (Grade I) mitral regurgitation.  Trace tricuspid regurgitation. Unable to calculate RV systolic pressure  due to inadequate TR jet.  Estimated right atrial pressure 8 mmHg.       Assessment & Plan:   # LUL pet avid 828mnodule: Blackish material obtained during bronchoscopy, call of possible granuloma in room but final path without mention of granulomatous inflammation. Grandfather with GPA but pt is ANCA neg.  # Emphysema # Suspected COPD gold B  # Smoking Cessation encouraged, declines chantix, patches, gum today  Plan: - We will schedule  PFTs  - Finish stiolto, then switch to breo 1 puff once daily, then once these inhalers are done start anoro 1 puff once daily  - let me know if you have any preference for this vs breo - resume annual LDCT screening - Do your best to stop smoking!   Maryjane Hurter, MD [facility] 04/23/2022 4:58 PM

## 2022-04-25 ENCOUNTER — Ambulatory Visit (INDEPENDENT_AMBULATORY_CARE_PROVIDER_SITE_OTHER): Payer: Medicare Other | Admitting: Student

## 2022-04-25 ENCOUNTER — Encounter: Payer: Self-pay | Admitting: Student

## 2022-04-25 VITALS — BP 122/68 | HR 64 | Temp 98.1°F | Ht 70.0 in | Wt 138.4 lb

## 2022-04-25 DIAGNOSIS — J432 Centrilobular emphysema: Secondary | ICD-10-CM | POA: Diagnosis not present

## 2022-04-25 DIAGNOSIS — F17200 Nicotine dependence, unspecified, uncomplicated: Secondary | ICD-10-CM | POA: Diagnosis not present

## 2022-04-25 NOTE — Patient Instructions (Addendum)
-   We will schedule PFTs next visit - Breo 1 puff once daily rinse mouth/brush teeth after use - Do your best to stop smoking! - See you in 6 months!

## 2022-05-02 ENCOUNTER — Encounter (HOSPITAL_COMMUNITY)
Admission: RE | Admit: 2022-05-02 | Discharge: 2022-05-02 | Disposition: A | Discharge status: Home / Self Care | Payer: Medicare Other | Source: Ambulatory Visit | Attending: Urology | Admitting: Urology

## 2022-05-02 ENCOUNTER — Encounter (HOSPITAL_COMMUNITY): Payer: Self-pay

## 2022-05-02 VITALS — BP 109/53 | HR 61 | Temp 98.5°F | Resp 12 | Ht 70.0 in | Wt 135.0 lb

## 2022-05-02 DIAGNOSIS — Z01812 Encounter for preprocedural laboratory examination: Secondary | ICD-10-CM | POA: Diagnosis not present

## 2022-05-02 DIAGNOSIS — I350 Nonrheumatic aortic (valve) stenosis: Secondary | ICD-10-CM | POA: Diagnosis not present

## 2022-05-02 DIAGNOSIS — I10 Essential (primary) hypertension: Secondary | ICD-10-CM | POA: Insufficient documentation

## 2022-05-02 HISTORY — DX: Cardiac murmur, unspecified: R01.1

## 2022-05-02 LAB — BASIC METABOLIC PANEL
Anion gap: 6 (ref 5–15)
BUN: 12 mg/dL (ref 8–23)
CO2: 27 mmol/L (ref 22–32)
Calcium: 9 mg/dL (ref 8.9–10.3)
Chloride: 105 mmol/L (ref 98–111)
Creatinine, Ser: 1.14 mg/dL (ref 0.61–1.24)
GFR, Estimated: >60 mL/min (ref >60)
Glucose, Bld: 97 mg/dL (ref 70–99)
Potassium: 4.8 mmol/L (ref 3.5–5.1)
Sodium: 138 mmol/L (ref 135–145)

## 2022-05-02 LAB — CBC
HCT: 42.2 % (ref 39.0–52.0)
Hemoglobin: 13.9 g/dL (ref 13.0–17.0)
MCH: 32.3 pg (ref 26.0–34.0)
MCHC: 32.9 g/dL (ref 30.0–36.0)
MCV: 97.9 fL (ref 80.0–100.0)
Platelets: 216 10*3/uL (ref 150–400)
RBC: 4.31 MIL/uL (ref 4.22–5.81)
RDW: 13.7 % (ref 11.5–15.5)
WBC: 8.2 10*3/uL (ref 4.0–10.5)
nRBC: 0 % (ref 0.0–0.2)

## 2022-05-10 ENCOUNTER — Inpatient Hospital Stay (HOSPITAL_COMMUNITY): Payer: Medicare Other | Admitting: Certified Registered Nurse Anesthetist

## 2022-05-10 ENCOUNTER — Inpatient Hospital Stay (HOSPITAL_COMMUNITY): Payer: Medicare Other | Admitting: Physician Assistant

## 2022-05-10 ENCOUNTER — Other Ambulatory Visit: Payer: Self-pay

## 2022-05-10 ENCOUNTER — Inpatient Hospital Stay (HOSPITAL_COMMUNITY)
Admission: RE | Admit: 2022-05-10 | Discharge: 2022-05-12 | DRG: 655 | Disposition: A | Discharge status: Home / Self Care | Payer: Medicare Other | Attending: Urology | Admitting: Urology

## 2022-05-10 ENCOUNTER — Encounter (HOSPITAL_COMMUNITY)
Admission: RE | Disposition: A | Discharge status: Home / Self Care | Payer: Self-pay | Source: Home / Self Care | Attending: Urology

## 2022-05-10 ENCOUNTER — Encounter (HOSPITAL_COMMUNITY): Payer: Self-pay | Admitting: Urology

## 2022-05-10 DIAGNOSIS — C679 Malignant neoplasm of bladder, unspecified: Secondary | ICD-10-CM | POA: Diagnosis present

## 2022-05-10 DIAGNOSIS — Z8249 Family history of ischemic heart disease and other diseases of the circulatory system: Secondary | ICD-10-CM

## 2022-05-10 DIAGNOSIS — J449 Chronic obstructive pulmonary disease, unspecified: Secondary | ICD-10-CM | POA: Diagnosis present

## 2022-05-10 DIAGNOSIS — Z79899 Other long term (current) drug therapy: Secondary | ICD-10-CM | POA: Diagnosis not present

## 2022-05-10 DIAGNOSIS — C662 Malignant neoplasm of left ureter: Secondary | ICD-10-CM | POA: Diagnosis present

## 2022-05-10 DIAGNOSIS — I352 Nonrheumatic aortic (valve) stenosis with insufficiency: Secondary | ICD-10-CM | POA: Diagnosis present

## 2022-05-10 DIAGNOSIS — C642 Malignant neoplasm of left kidney, except renal pelvis: Principal | ICD-10-CM | POA: Diagnosis present

## 2022-05-10 DIAGNOSIS — I10 Essential (primary) hypertension: Secondary | ICD-10-CM | POA: Diagnosis present

## 2022-05-10 DIAGNOSIS — F17210 Nicotine dependence, cigarettes, uncomplicated: Secondary | ICD-10-CM | POA: Diagnosis present

## 2022-05-10 DIAGNOSIS — Z825 Family history of asthma and other chronic lower respiratory diseases: Secondary | ICD-10-CM

## 2022-05-10 DIAGNOSIS — Z960 Presence of urogenital implants: Secondary | ICD-10-CM | POA: Diagnosis present

## 2022-05-10 DIAGNOSIS — C652 Malignant neoplasm of left renal pelvis: Secondary | ICD-10-CM

## 2022-05-10 DIAGNOSIS — Z83438 Family history of other disorder of lipoprotein metabolism and other lipidemia: Secondary | ICD-10-CM | POA: Diagnosis not present

## 2022-05-10 DIAGNOSIS — Z881 Allergy status to other antibiotic agents status: Secondary | ICD-10-CM

## 2022-05-10 DIAGNOSIS — E785 Hyperlipidemia, unspecified: Secondary | ICD-10-CM | POA: Diagnosis present

## 2022-05-10 DIAGNOSIS — Z882 Allergy status to sulfonamides status: Secondary | ICD-10-CM

## 2022-05-10 DIAGNOSIS — Z8551 Personal history of malignant neoplasm of bladder: Secondary | ICD-10-CM | POA: Diagnosis not present

## 2022-05-10 DIAGNOSIS — R2689 Other abnormalities of gait and mobility: Secondary | ICD-10-CM | POA: Diagnosis not present

## 2022-05-10 DIAGNOSIS — Z7951 Long term (current) use of inhaled steroids: Secondary | ICD-10-CM

## 2022-05-10 DIAGNOSIS — E063 Autoimmune thyroiditis: Secondary | ICD-10-CM | POA: Diagnosis present

## 2022-05-10 DIAGNOSIS — I2510 Atherosclerotic heart disease of native coronary artery without angina pectoris: Secondary | ICD-10-CM | POA: Diagnosis present

## 2022-05-10 DIAGNOSIS — Z79890 Hormone replacement therapy (postmenopausal): Secondary | ICD-10-CM | POA: Diagnosis not present

## 2022-05-10 DIAGNOSIS — Z7982 Long term (current) use of aspirin: Secondary | ICD-10-CM | POA: Diagnosis not present

## 2022-05-10 DIAGNOSIS — Z87442 Personal history of urinary calculi: Secondary | ICD-10-CM

## 2022-05-10 DIAGNOSIS — Z833 Family history of diabetes mellitus: Secondary | ICD-10-CM

## 2022-05-10 DIAGNOSIS — Z888 Allergy status to other drugs, medicaments and biological substances status: Secondary | ICD-10-CM | POA: Diagnosis not present

## 2022-05-10 DIAGNOSIS — H9192 Unspecified hearing loss, left ear: Secondary | ICD-10-CM | POA: Diagnosis present

## 2022-05-10 HISTORY — PX: ROBOT ASSITED LAPAROSCOPIC NEPHROURETERECTOMY: SHX6077

## 2022-05-10 HISTORY — PX: CYSTOSCOPY: SHX5120

## 2022-05-10 LAB — HEMOGLOBIN AND HEMATOCRIT, BLOOD
HCT: 41.1 % (ref 39.0–52.0)
Hemoglobin: 13.8 g/dL (ref 13.0–17.0)

## 2022-05-10 SURGERY — NEPHROURETERECTOMY, ROBOT-ASSISTED, LAPAROSCOPIC
Anesthesia: General | Laterality: Left

## 2022-05-10 MED ORDER — LIDOCAINE 2% (20 MG/ML) 5 ML SYRINGE
INTRAMUSCULAR | Status: DC | PRN
  Administered 2022-05-10: 60 mg via INTRAVENOUS

## 2022-05-10 MED ORDER — ONDANSETRON HCL 4 MG/2ML IJ SOLN: 4.0000 mg | Freq: Once | INTRAMUSCULAR | Status: DC | PRN

## 2022-05-10 MED ORDER — SODIUM CHLORIDE 0.9 % IV SOLN: INTRAVENOUS | Status: DC

## 2022-05-10 MED ORDER — SENNA 8.6 MG PO TABS
1.0000 | ORAL_TABLET | Freq: Two times a day (BID) | ORAL | Status: DC
  Administered 2022-05-10 – 2022-05-12 (×4): 8.6 mg via ORAL
  Filled 2022-05-10 (×4): qty 1

## 2022-05-10 MED ORDER — CEFAZOLIN SODIUM-DEXTROSE 1-4 GM/50ML-% IV SOLN
1.0000 g | Freq: Three times a day (TID) | INTRAVENOUS | Status: AC
  Administered 2022-05-10 – 2022-05-11 (×2): 1 g via INTRAVENOUS
  Filled 2022-05-10 (×2): qty 50

## 2022-05-10 MED ORDER — SODIUM CHLORIDE 0.9% FLUSH: 3.0000 mL | INTRAVENOUS | Status: DC | PRN

## 2022-05-10 MED ORDER — ROCURONIUM BROMIDE 10 MG/ML (PF) SYRINGE
PREFILLED_SYRINGE | INTRAVENOUS | Status: DC | PRN
  Administered 2022-05-10: 20 mg via INTRAVENOUS
  Administered 2022-05-10: 60 mg via INTRAVENOUS
  Administered 2022-05-10: 40 mg via INTRAVENOUS

## 2022-05-10 MED ORDER — MORPHINE SULFATE (PF) 2 MG/ML IV SOLN
2.0000 mg | INTRAVENOUS | Status: DC | PRN
  Administered 2022-05-10 (×2): 2 mg via INTRAVENOUS
  Administered 2022-05-11: 4 mg via INTRAVENOUS
  Administered 2022-05-11: 2 mg via INTRAVENOUS
  Filled 2022-05-10: qty 2
  Filled 2022-05-10 (×3): qty 1

## 2022-05-10 MED ORDER — DEXAMETHASONE SODIUM PHOSPHATE 10 MG/ML IJ SOLN
INTRAMUSCULAR | Status: AC
  Filled 2022-05-10: qty 1

## 2022-05-10 MED ORDER — EPHEDRINE 5 MG/ML INJ
INTRAVENOUS | Status: AC
  Filled 2022-05-10: qty 5

## 2022-05-10 MED ORDER — SODIUM CHLORIDE 0.9% FLUSH
3.0000 mL | Freq: Two times a day (BID) | INTRAVENOUS | Status: DC
  Administered 2022-05-10 – 2022-05-12 (×4): 3 mL via INTRAVENOUS

## 2022-05-10 MED ORDER — KETAMINE HCL 10 MG/ML IJ SOLN
INTRAMUSCULAR | Status: DC | PRN
  Administered 2022-05-10: 30 mg via INTRAVENOUS

## 2022-05-10 MED ORDER — FENTANYL CITRATE (PF) 100 MCG/2ML IJ SOLN
INTRAMUSCULAR | Status: DC | PRN
  Administered 2022-05-10 (×3): 50 ug via INTRAVENOUS

## 2022-05-10 MED ORDER — BUPIVACAINE LIPOSOME 1.3 % IJ SUSP
INTRAMUSCULAR | Status: DC | PRN
  Administered 2022-05-10: 20 mL

## 2022-05-10 MED ORDER — LIDOCAINE HCL (PF) 2 % IJ SOLN
INTRAMUSCULAR | Status: AC
  Filled 2022-05-10: qty 5

## 2022-05-10 MED ORDER — MIDAZOLAM HCL 2 MG/2ML IJ SOLN
INTRAMUSCULAR | Status: AC
  Filled 2022-05-10: qty 2

## 2022-05-10 MED ORDER — ACETAMINOPHEN 500 MG PO TABS
1000.0000 mg | ORAL_TABLET | Freq: Four times a day (QID) | ORAL | Status: AC
  Administered 2022-05-10 – 2022-05-11 (×4): 1000 mg via ORAL
  Filled 2022-05-10 (×2): qty 2

## 2022-05-10 MED ORDER — CEFAZOLIN SODIUM-DEXTROSE 2-4 GM/100ML-% IV SOLN
2.0000 g | INTRAVENOUS | Status: AC
  Administered 2022-05-10: 2 g via INTRAVENOUS
  Filled 2022-05-10: qty 100

## 2022-05-10 MED ORDER — SODIUM CHLORIDE (PF) 0.9 % IJ SOLN
INTRAMUSCULAR | Status: DC | PRN
  Administered 2022-05-10: 20 mL

## 2022-05-10 MED ORDER — PROPOFOL 10 MG/ML IV BOLUS
INTRAVENOUS | Status: DC | PRN
  Administered 2022-05-10: 80 mg via INTRAVENOUS

## 2022-05-10 MED ORDER — LACTATED RINGERS IV SOLN: INTRAVENOUS | Status: DC | PRN

## 2022-05-10 MED ORDER — SODIUM CHLORIDE 0.9 % IV SOLN: 250.0000 mL | INTRAVENOUS | Status: DC | PRN

## 2022-05-10 MED ORDER — LIDOCAINE HCL (PF) 2 % IJ SOLN
INTRAMUSCULAR | Status: DC | PRN
  Administered 2022-05-10: 1.5 mg/kg/h

## 2022-05-10 MED ORDER — OXYCODONE HCL 5 MG PO TABS: 5.0000 mg | ORAL_TABLET | Freq: Once | ORAL | Status: DC | PRN

## 2022-05-10 MED ORDER — ONDANSETRON HCL 4 MG/2ML IJ SOLN
INTRAMUSCULAR | Status: DC | PRN
  Administered 2022-05-10: 4 mg via INTRAVENOUS

## 2022-05-10 MED ORDER — SODIUM CHLORIDE (PF) 0.9 % IJ SOLN
INTRAMUSCULAR | Status: AC
  Filled 2022-05-10: qty 20

## 2022-05-10 MED ORDER — CHLORHEXIDINE GLUCONATE 0.12 % MT SOLN
15.0000 mL | Freq: Once | OROMUCOSAL | Status: AC
  Administered 2022-05-10: 15 mL via OROMUCOSAL

## 2022-05-10 MED ORDER — FENTANYL CITRATE (PF) 250 MCG/5ML IJ SOLN
INTRAMUSCULAR | Status: AC
  Filled 2022-05-10: qty 5

## 2022-05-10 MED ORDER — ACETAMINOPHEN 500 MG PO TABS
1000.0000 mg | ORAL_TABLET | Freq: Once | ORAL | Status: AC
  Administered 2022-05-10: 1000 mg via ORAL
  Filled 2022-05-10: qty 2

## 2022-05-10 MED ORDER — DOCUSATE SODIUM 100 MG PO CAPS
100.0000 mg | ORAL_CAPSULE | Freq: Two times a day (BID) | ORAL | Status: DC
  Administered 2022-05-10 – 2022-05-12 (×4): 100 mg via ORAL
  Filled 2022-05-10 (×4): qty 1

## 2022-05-10 MED ORDER — ONDANSETRON HCL 4 MG/2ML IJ SOLN: 4.0000 mg | INTRAMUSCULAR | Status: DC | PRN

## 2022-05-10 MED ORDER — ROCURONIUM BROMIDE 10 MG/ML (PF) SYRINGE
PREFILLED_SYRINGE | INTRAVENOUS | Status: AC
  Filled 2022-05-10: qty 10

## 2022-05-10 MED ORDER — MAGNESIUM CITRATE PO SOLN: 1.0000 | Freq: Once | ORAL | Status: DC

## 2022-05-10 MED ORDER — OXYCODONE HCL 5 MG/5ML PO SOLN: 5.0000 mg | Freq: Once | ORAL | Status: DC | PRN

## 2022-05-10 MED ORDER — ONDANSETRON HCL 4 MG/2ML IJ SOLN
INTRAMUSCULAR | Status: AC
  Filled 2022-05-10: qty 2

## 2022-05-10 MED ORDER — DEXAMETHASONE SODIUM PHOSPHATE 4 MG/ML IJ SOLN
INTRAMUSCULAR | Status: DC | PRN
  Administered 2022-05-10: 8 mg via INTRAVENOUS

## 2022-05-10 MED ORDER — HYDROMORPHONE HCL 1 MG/ML IJ SOLN: 0.2500 mg | INTRAMUSCULAR | Status: DC | PRN

## 2022-05-10 MED ORDER — LACTATED RINGERS IV SOLN: INTRAVENOUS | Status: DC

## 2022-05-10 MED ORDER — EPHEDRINE SULFATE-NACL 50-0.9 MG/10ML-% IV SOSY
PREFILLED_SYRINGE | INTRAVENOUS | Status: DC | PRN
  Administered 2022-05-10: 10 mg via INTRAVENOUS
  Administered 2022-05-10 (×2): 7.5 mg via INTRAVENOUS

## 2022-05-10 MED ORDER — PROPOFOL 10 MG/ML IV BOLUS
INTRAVENOUS | Status: AC
  Filled 2022-05-10: qty 20

## 2022-05-10 MED ORDER — STERILE WATER FOR IRRIGATION IR SOLN
Status: DC | PRN
  Administered 2022-05-10: 1000 mL

## 2022-05-10 MED ORDER — FLUTICASONE FUROATE-VILANTEROL 200-25 MCG/ACT IN AEPB
1.0000 | INHALATION_SPRAY | Freq: Every day | RESPIRATORY_TRACT | Status: DC
  Administered 2022-05-11 – 2022-05-12 (×2): 1 via RESPIRATORY_TRACT
  Filled 2022-05-10: qty 28

## 2022-05-10 MED ORDER — LACTATED RINGERS IR SOLN
Status: DC | PRN
  Administered 2022-05-10: 1000 mL

## 2022-05-10 MED ORDER — ALBUTEROL SULFATE HFA 108 (90 BASE) MCG/ACT IN AERS: 1.0000 | INHALATION_SPRAY | Freq: Four times a day (QID) | RESPIRATORY_TRACT | Status: DC | PRN

## 2022-05-10 MED ORDER — KETAMINE HCL 10 MG/ML IJ SOLN
INTRAMUSCULAR | Status: AC
  Filled 2022-05-10: qty 1

## 2022-05-10 MED ORDER — OXYCODONE HCL 5 MG PO TABS
5.0000 mg | ORAL_TABLET | ORAL | Status: DC | PRN
  Administered 2022-05-11: 5 mg via ORAL
  Filled 2022-05-10: qty 1

## 2022-05-10 MED ORDER — SUGAMMADEX SODIUM 200 MG/2ML IV SOLN
INTRAVENOUS | Status: DC | PRN
  Administered 2022-05-10: 200 mg via INTRAVENOUS

## 2022-05-10 MED ORDER — ORAL CARE MOUTH RINSE: 15.0000 mL | Freq: Once | OROMUCOSAL | Status: AC

## 2022-05-10 MED ORDER — KETOROLAC TROMETHAMINE 30 MG/ML IJ SOLN: 30.0000 mg | Freq: Once | INTRAMUSCULAR | Status: DC | PRN

## 2022-05-10 MED ORDER — ALBUTEROL SULFATE (2.5 MG/3ML) 0.083% IN NEBU: 2.5000 mg | INHALATION_SOLUTION | Freq: Four times a day (QID) | RESPIRATORY_TRACT | Status: DC | PRN

## 2022-05-10 MED ORDER — BUPIVACAINE LIPOSOME 1.3 % IJ SUSP
INTRAMUSCULAR | Status: AC
  Filled 2022-05-10: qty 20

## 2022-05-10 MED ORDER — AMLODIPINE BESYLATE 5 MG PO TABS
5.0000 mg | ORAL_TABLET | Freq: Every day | ORAL | Status: DC
  Administered 2022-05-11 – 2022-05-12 (×2): 5 mg via ORAL
  Filled 2022-05-10 (×2): qty 1

## 2022-05-10 MED ORDER — MIDAZOLAM HCL 5 MG/5ML IJ SOLN
INTRAMUSCULAR | Status: DC | PRN
  Administered 2022-05-10: 2 mg via INTRAVENOUS

## 2022-05-10 MED ORDER — LIDOCAINE HCL 2 % IJ SOLN
INTRAMUSCULAR | Status: AC
  Filled 2022-05-10: qty 20

## 2022-05-10 MED ORDER — LEVOTHYROXINE SODIUM 112 MCG PO TABS
112.0000 ug | ORAL_TABLET | Freq: Every day | ORAL | Status: DC
  Administered 2022-05-11 – 2022-05-12 (×2): 112 ug via ORAL
  Filled 2022-05-10 (×2): qty 1

## 2022-05-10 SURGICAL SUPPLY — 68 items
ADH SKN CLS APL DERMABOND .7 (GAUZE/BANDAGES/DRESSINGS) ×4
APL PRP STRL LF DISP 70% ISPRP (MISCELLANEOUS) ×2
BAG COUNTER SPONGE SURGICOUNT (BAG) IMPLANT
BAG LAPAROSCOPIC 12 15 PORT 16 (BASKET) ×2 IMPLANT
BAG RETRIEVAL 12/15 (BASKET) ×2
BAG SPNG CNTER NS LX DISP (BAG)
CHLORAPREP W/TINT 26 (MISCELLANEOUS) ×2 IMPLANT
CLIP LIGATING HEM O LOK PURPLE (MISCELLANEOUS) ×2 IMPLANT
CLIP LIGATING HEMO LOK XL GOLD (MISCELLANEOUS) ×2 IMPLANT
CLIP LIGATING HEMO O LOK GREEN (MISCELLANEOUS) ×2 IMPLANT
COVER SURGICAL LIGHT HANDLE (MISCELLANEOUS) ×2 IMPLANT
COVER TIP SHEARS 8 DVNC (MISCELLANEOUS) ×2 IMPLANT
COVER TIP SHEARS 8MM DA VINCI (MISCELLANEOUS) ×2
CUTTER ECHEON FLEX ENDO 45 340 (ENDOMECHANICALS) IMPLANT
DERMABOND ADVANCED .7 DNX12 (GAUZE/BANDAGES/DRESSINGS) ×4 IMPLANT
DRAIN CHANNEL 15F RND FF 3/16 (WOUND CARE) IMPLANT
DRAPE ARM DVNC X/XI (DISPOSABLE) ×8 IMPLANT
DRAPE COLUMN DVNC XI (DISPOSABLE) ×2 IMPLANT
DRAPE DA VINCI XI ARM (DISPOSABLE) ×8
DRAPE DA VINCI XI COLUMN (DISPOSABLE) ×2
DRAPE INCISE IOBAN 66X45 STRL (DRAPES) ×2 IMPLANT
DRAPE SHEET LG 3/4 BI-LAMINATE (DRAPES) ×2 IMPLANT
DRSG TEGADERM 4X4.75 (GAUZE/BANDAGES/DRESSINGS) IMPLANT
ELECT PENCIL ROCKER SW 15FT (MISCELLANEOUS) ×2 IMPLANT
ELECT REM PT RETURN 15FT ADLT (MISCELLANEOUS) ×2 IMPLANT
EVACUATOR SILICONE 100CC (DRAIN) IMPLANT
GAUZE SPONGE 2X2 8PLY STRL LF (GAUZE/BANDAGES/DRESSINGS) IMPLANT
GLOVE BIO SURGEON STRL SZ 6.5 (GLOVE) ×2 IMPLANT
GLOVE SURG LX STRL 7.5 STRW (GLOVE) ×4 IMPLANT
GOWN SRG XL LVL 4 BRTHBL STRL (GOWNS) ×2 IMPLANT
GOWN STRL NON-REIN XL LVL4 (GOWNS) ×2
GOWN STRL REUS W/ TWL XL LVL3 (GOWN DISPOSABLE) ×4 IMPLANT
GOWN STRL REUS W/TWL XL LVL3 (GOWN DISPOSABLE) ×4
IRRIG SUCT STRYKERFLOW 2 WTIP (MISCELLANEOUS) ×2
IRRIGATION SUCT STRKRFLW 2 WTP (MISCELLANEOUS) ×2 IMPLANT
KIT BASIN OR (CUSTOM PROCEDURE TRAY) ×2 IMPLANT
KIT TURNOVER KIT A (KITS) IMPLANT
LOOP VESSEL MAXI BLUE (MISCELLANEOUS) ×2 IMPLANT
MARKER SKIN DUAL TIP RULER LAB (MISCELLANEOUS) ×2 IMPLANT
NDL INSUFFLATION 14GA 120MM (NEEDLE) ×2 IMPLANT
NEEDLE INSUFFLATION 14GA 120MM (NEEDLE) ×2 IMPLANT
NS IRRIG 1000ML POUR BTL (IV SOLUTION) ×2 IMPLANT
PORT ACCESS TROCAR AIRSEAL 12 (TROCAR) ×2 IMPLANT
PROTECTOR NERVE ULNAR (MISCELLANEOUS) ×4 IMPLANT
RELOAD STAPLE 45 2.6 WHT THIN (STAPLE) IMPLANT
SEAL CANN UNIV 5-8 DVNC XI (MISCELLANEOUS) ×8 IMPLANT
SEAL XI 5MM-8MM UNIVERSAL (MISCELLANEOUS) ×8
SET IRRIG Y TYPE TUR BLADDER L (SET/KITS/TRAYS/PACK) IMPLANT
SET TRI-LUMEN FLTR TB AIRSEAL (TUBING) ×2 IMPLANT
SLEEVE ADV FIXATION 12X100MM (TROCAR) ×2 IMPLANT
SOLUTION ELECTROLUBE (MISCELLANEOUS) ×2 IMPLANT
SPIKE FLUID TRANSFER (MISCELLANEOUS) ×2 IMPLANT
STAPLE RELOAD 45 WHT (STAPLE) ×8 IMPLANT
STAPLE RELOAD 45MM WHITE (STAPLE) ×8
SUT ETHILON 3 0 PS 1 (SUTURE) IMPLANT
SUT MNCRL AB 4-0 PS2 18 (SUTURE) ×4 IMPLANT
SUT PDS AB 1 CT1 27 (SUTURE) ×6 IMPLANT
SUT VIC AB 2-0 SH 27 (SUTURE) ×2
SUT VIC AB 2-0 SH 27X BRD (SUTURE) ×2 IMPLANT
SUT VLOC BARB 180 ABS3/0GR12 (SUTURE) ×4
SUTURE VLOC BRB 180 ABS3/0GR12 (SUTURE) ×2 IMPLANT
TOWEL OR 17X26 10 PK STRL BLUE (TOWEL DISPOSABLE) ×2 IMPLANT
TOWEL OR NON WOVEN STRL DISP B (DISPOSABLE) ×2 IMPLANT
TRAY FOLEY MTR SLVR 16FR STAT (SET/KITS/TRAYS/PACK) ×2 IMPLANT
TRAY LAPAROSCOPIC (CUSTOM PROCEDURE TRAY) ×2 IMPLANT
TROCAR ADV FIXATION 12X100MM (TROCAR) ×2 IMPLANT
TROCAR Z-THREAD OPTICAL 5X100M (TROCAR) IMPLANT
WATER STERILE IRR 1000ML POUR (IV SOLUTION) ×2 IMPLANT

## 2022-05-10 NOTE — Progress Notes (Signed)
The patient arrived to the unit. He is drowsy, easy to arouse, alert, oriented x4. Incisions x 4 cdi, foley is patent, Jp charged.

## 2022-05-10 NOTE — Transfer of Care (Signed)
Immediate Anesthesia Transfer of Care Note  Patient: Harry Young  Procedure(s) Performed: XI ROBOT ASSITED LAPAROSCOPIC NEPHROURETERECTOMY (Left)  Patient Location: PACU  Anesthesia Type:General  Level of Consciousness: sedated  Airway & Oxygen Therapy: Patient Spontanous Breathing and Patient connected to face mask oxygen  Post-op Assessment: Report given to RN and Post -op Vital signs reviewed and stable  Post vital signs: Reviewed and stable  Last Vitals:  Vitals Value Taken Time  BP    Temp    Pulse 69 05/10/22 1603  Resp 11 05/10/22 1603  SpO2 100 % 05/10/22 1603  Vitals shown include unvalidated device data.  Last Pain:  Vitals:   05/10/22 1118  TempSrc:   PainSc: 0-No pain         Complications: No notable events documented.

## 2022-05-10 NOTE — Anesthesia Postprocedure Evaluation (Signed)
Anesthesia Post Note  Patient: Harry Young  Procedure(s) Performed: XI ROBOT ASSITED LAPAROSCOPIC NEPHROURETERECTOMY (Left) CYSTOSCOPY WITH STENT REMOVAL     Patient location during evaluation: PACU Anesthesia Type: General Level of consciousness: awake and alert Pain management: pain level controlled Vital Signs Assessment: post-procedure vital signs reviewed and stable Respiratory status: spontaneous breathing, nonlabored ventilation, respiratory function stable and patient connected to nasal cannula oxygen Cardiovascular status: blood pressure returned to baseline and stable Postop Assessment: no apparent nausea or vomiting Anesthetic complications: no  No notable events documented.  Last Vitals:  Vitals:   05/10/22 1715 05/10/22 1727  BP: (!) 126/54 (!) 134/58  Pulse: 81 85  Resp: 11 18  Temp:  36.4 C  SpO2: 96% 96%    Last Pain:  Vitals:   05/10/22 1727  TempSrc: Oral  PainSc:                  Saahas Hidrogo S

## 2022-05-10 NOTE — H&P (Signed)
Harry Young is an 66 y.o. male.    Chief Complaint: Pre-Op LEFT Nephroureterectomy  HPI:   1 - Bladder Cancer - T1G3 in 2011 s/p BCG induction x 3. Most recent recurrence 2015. Now on yearly cysto cylce   Recent surveilance:  01/2022 - CT - left renal pelvis cancer, cysto with no blader lesions   2 - Left Renal Pelvis Cancer - long h/o lower tract bladder cancer. New left renal pelvis mass large voluem on CT 01/2022. 1 artery / 1 vein (large lumbar below artery) left renovascualr anatomy. Cytology fom OR rerotrade / [facility] urothelial carcinoma, favor low grade. Has left JJ stent at present. Cr 1.1   PMH sig for chronic back pain / narcotics, COPD (not limitig). His wife Harry Young is very involved. Retired from [employer]. / His PCP is Harry Pilling NP with [facility].   Today " Harry Young " is seen to proceed with LEFT nephroureterectomy. No interval fevers. Hgb 14, Cr 1.1, most recent UCX negative.    Past Medical History:  Diagnosis Date   Anxiety disorder, unspecified    Asymmetrical hearing loss of left ear 01/03/2018   Bilateral carotid artery stenosis 08/07/2015   Dr. Quay Burow- 04/01/15- "Heterogenous plaque- 1-39% RICA stenosis.  Homogenous plaque- 35-70% LICA stenosis.  >50% LECA stenosis.  Normal subclavian arteries bilaterally.  Patent vertebral arteries with antegrade flow.   Constipation 08/07/2015   COPD (chronic obstructive pulmonary disease) (HCC)    Coronary artery disease involving native coronary artery of native heart without angina pectoris 04/03/2019   Displacement of lumbar intervertebral disc without myelopathy 04/14/2013   Dyspnea    Essential (primary) hypertension 04/03/2019   Golfer's elbow, right 03/30/2016   Heart murmur    History of kidney stones    Hyperlipidemia 08/07/2015   Hypothyroidism due to Hashimoto's thyroiditis 07/15/2015   Leg weakness 08/07/2015   Lumbar radiculopathy 10/21/2013   Nocturia    Nonrheumatic aortic valve insufficiency  02/22/2019   Nonrheumatic aortic valve stenosis 02/22/2019   Spinal stenosis of lumbar region 05/19/2015   Transitional cell carcinoma of bladder ([place]) UROLOGIST--  DR Karsten Ro   S/P TURBT X3--  HX BCG TX'S   Vitamin D deficiency     Past Surgical History:  Procedure Laterality Date   BRONCHIAL BIOPSY  01/09/2022   Procedure: BRONCHIAL BIOPSIES;  Surgeon: Maryjane Hurter, MD;  Location: [place];  Service: Pulmonary;;   BRONCHIAL BRUSHINGS  01/09/2022   Procedure: BRONCHIAL BRUSHINGS;  Surgeon: Maryjane Hurter, MD;  Location: [place];  Service: Pulmonary;;   BRONCHIAL NEEDLE ASPIRATION BIOPSY  01/09/2022   Procedure: BRONCHIAL NEEDLE ASPIRATION BIOPSIES;  Surgeon: Maryjane Hurter, MD;  Location: [facility];  Service: Pulmonary;;   CYSTOSCOPY W/ RETROGRADES Bilateral 12/13/2012   Procedure: CYSTOSCOPY WITH RETROGRADE PYELOGRAM;  Surgeon: Claybon Jabs, MD;  Location: [place];  Service: Urology;  Laterality: Bilateral;   CYSTOSCOPY WITH LITHOLAPAXY N/A 04/04/2013   Procedure: CYSTOSCOPY ;  Surgeon: Claybon Jabs, MD;  Location: [facility];  Service: Urology;  Laterality: N/A;   CYSTOSCOPY WITH RETROGRADE PYELOGRAM, URETEROSCOPY AND STENT PLACEMENT Bilateral 02/28/2022   Procedure: CYSTOSCOPY WITH RETROGRADE PYELOGRAM, URETEROSCOPY, LEFT STENT PLACEMENT;  Surgeon: Remi Haggard, MD;  [facility];  Service: Urology;  Laterality: Bilateral;  1 HR   LUMBAR DISC SURGERY  09/20/2005   L2 -- L5   LUMBAR LAMINECTOMY/DECOMPRESSION MICRODISCECTOMY Left 01/09/2013   Procedure: Left  lumbar four-five microdiskectomy;  Surgeon: Vickki Muff  Christella Noa, MD;  Location: [place] [facility];  Service: Neurosurgery;  Laterality: Left;   LUMBAR WOUND DEBRIDEMENT N/A 01/13/2013   Procedure: LUMBAR WOUND DEBRIDEMENT;  Surgeon: Charlie Pitter, MD;  Location: [place] [facility];  Service: Neurosurgery;  Laterality: N/A;  Lumbar Wound Exploration,  Attempted Placement of  Cerebrospinal Drain, and Repair of CSF Leak   TOOTH EXTRACTION     TRANSURETHRAL RESECTION OF BLADDER TUMOR  07-05-2009;   11-22-2009   bladder cancer   TRANSURETHRAL RESECTION OF BLADDER TUMOR N/A 12/13/2012   Procedure: TRANSURETHRAL RESECTION OF BLADDER TUMOR (TURBT) AND INSTILLATION OF MYTOMYCIN;  Surgeon: Claybon Jabs, MD;  Location: [facility];  Service: Urology;  Laterality: N/A;   TRANSURETHRAL RESECTION OF BLADDER TUMOR WITH GYRUS (TURBT-GYRUS) N/A 04/04/2013   Procedure: TRANSURETHRAL RESECTION OF BLADDER TUMOR WITH GYRUS (TURBT-GYRUS);  Surgeon: Claybon Jabs, MD;  Location: [facility];  Service: Urology;  Laterality: N/A;    Family History  Problem Relation Age of Onset   Hyperlipidemia Mother    Heart disease Father    Heart failure Father    COPD Father    Diabetes Brother    Social History:  reports that he has been smoking cigarettes. He started smoking about 53 years ago. He has a 53.00 pack-year smoking history. He has never used smokeless tobacco. He reports that he does not drink alcohol and does not use drugs.  Allergies:  Allergies  Allergen Reactions   Crestor [Rosuvastatin] Other (See Comments)    Urinary retention.   Sulfa Antibiotics Rash    Medications Prior to Admission  Medication Sig Dispense Refill   albuterol (VENTOLIN HFA) 108 (90 Base) MCG/ACT inhaler Inhale 1-2 puffs into the lungs every 6 (six) hours as needed for shortness of breath or wheezing.     amLODipine (NORVASC) 5 MG tablet TAKE 1 TABLET BY MOUTH EVERY DAY 90 tablet 0   BREO ELLIPTA 200-25 MCG/ACT AEPB Inhale 1 puff into the lungs daily.     Calcium-Magnesium-Zinc (CAL-MAG-ZINC PO) Take 1 tablet by mouth daily.     Cholecalciferol (VITAMIN D3) 25 MCG (1000 UT) CAPS Take 1,000 Units by mouth daily with supper.     ezetimibe (ZETIA) 10 MG tablet Take 10 mg by mouth daily.     Homeopathic Products (RESTFUL LEGS SL) Place 1 tablet under the tongue at  bedtime as needed (restless legs).     HYDROcodone-acetaminophen (NORCO) 10-325 MG tablet Take 1 tablet by mouth at bedtime as needed for moderate pain.     levothyroxine (SYNTHROID) 112 MCG tablet Take 112 mcg by mouth daily before breakfast.     Omega-3 Fatty Acids (FISH OIL) 1200 MG CAPS Take 1,200 mg by mouth daily.     sildenafil (VIAGRA) 50 MG tablet Take 50 mg by mouth daily as needed for erectile dysfunction.     simvastatin (ZOCOR) 10 MG tablet Take 10 mg by mouth 3 (three) times a week.     aspirin EC 81 MG tablet Take 81 mg by mouth daily at 12 noon.     traMADol (ULTRAM) 50 MG tablet Take 1 tablet (50 mg total) by mouth every 6 (six) hours as needed. (Patient not taking: Reported on 04/25/2022) 20 tablet 0   umeclidinium-vilanterol (ANORO ELLIPTA) 62.5-25 MCG/ACT AEPB Inhale 1 puff into the lungs daily. (Patient not taking: Reported on 04/25/2022) 60 each 11    No results found for this or any previous visit (from the past 48 hour(s)). No results  found.  Review of Systems  Constitutional:  Negative for chills and fever.  Genitourinary:  Positive for urgency.  All other systems reviewed and are negative.   There were no vitals taken for this visit. Physical Exam Vitals reviewed.  HENT:     Head: Normocephalic.  Eyes:     Pupils: Pupils are equal, round, and reactive to light.  Cardiovascular:     Rate and Rhythm: Normal rate.  Abdominal:     General: Abdomen is flat.  Genitourinary:    Comments: No CVAT at present Musculoskeletal:        General: Normal range of motion.     Cervical back: Normal range of motion.  Skin:    General: Skin is warm.  Neurological:     General: No focal deficit present.     Mental Status: He is alert.      Assessment/Plan  Proceed as planned with LEFT nephroureterectomy. Risks, benefits, alternatives, expected peri-op course discussed previously and reiterated today. He understands that his pulmonary and vascular comorbidity  increases risk of peri-op complications substantially including pulm failure, pneumonia, MI, and mortality.   Alexis Frock, MD 05/10/2022, 10:59 AM

## 2022-05-10 NOTE — Anesthesia Preprocedure Evaluation (Signed)
Anesthesia Evaluation  Patient identified by MRN, date of birth, ID band Patient awake    Reviewed: Allergy & Precautions, NPO status , Patient's Chart, lab work & pertinent test results  Airway Mallampati: II  TM Distance: >3 FB Neck ROM: Full    Dental  (+) Dental Advisory Given   Pulmonary COPD,  COPD inhaler, Current Smoker and Patient abstained from smoking.   breath sounds clear to auscultation       Cardiovascular hypertension, Pt. on medications + CAD  + Valvular Problems/Murmurs (Mild AS, Mod AI) AS and AI  Rhythm:Regular Rate:Normal     Neuro/Psych  Neuromuscular disease    GI/Hepatic negative GI ROS, Neg liver ROS,,,  Endo/Other  Hypothyroidism    Renal/GU negative Renal ROS     Musculoskeletal   Abdominal   Peds  Hematology negative hematology ROS (+)   Anesthesia Other Findings   Reproductive/Obstetrics                              Lab Results  Component Value Date   WBC 8.2 05/02/2022   HGB 13.9 05/02/2022   HCT 42.2 05/02/2022   MCV 97.9 05/02/2022   PLT 216 05/02/2022   Lab Results  Component Value Date   CREATININE 1.14 05/02/2022   BUN 12 05/02/2022   NA 138 05/02/2022   K 4.8 05/02/2022   CL 105 05/02/2022   CO2 27 05/02/2022    Anesthesia Physical Anesthesia Plan  ASA: 3  Anesthesia Plan: General   Post-op Pain Management: Tylenol PO (pre-op)*, Lidocaine infusion* and Ketamine IV*   Induction: Intravenous  PONV Risk Score and Plan: 1 and Dexamethasone, Ondansetron and Treatment may vary due to age or medical condition  Airway Management Planned: Oral ETT  Additional Equipment:   Intra-op Plan:   Post-operative Plan: Extubation in OR  Informed Consent: I have reviewed the patients History and Physical, chart, labs and discussed the procedure including the risks, benefits and alternatives for the proposed anesthesia with the patient or  authorized representative who has indicated his/her understanding and acceptance.     Dental advisory given  Plan Discussed with: CRNA  Anesthesia Plan Comments:          Anesthesia Quick Evaluation

## 2022-05-10 NOTE — Discharge Instructions (Signed)

## 2022-05-10 NOTE — Op Note (Signed)
NAME: Dipinto, Nain D. MEDICAL RECORD NO: [id number] ACCOUNT NO: [id number] DATE OF BIRTH: Oct 07, 1955 FACILITY: [facility]ATION: [facility] PHYSICIAN: Alexis Frock, MD  Operative Report   SURGEON:  Alexis Frock, MD  PREOPERATIVE DIAGNOSIS:  Large volume left renal pelvis cancer.  PROCEDURES: 1.  Robotic-assisted laparoscopic left radical nephroureterectomy. 2.  Cystoscopy with stent removal.  ESTIMATED BLOOD LOSS:  50 mL.  COMPLICATIONS:  None.  SPECIMENS: 1.  Left kidney plus ureter plus bladder cuff en bloc with proximal stent. 2.  Distal aspect of stent for discard.  FINDINGS:  Single artery, single vein, left renal vascular anatomy with large lumbar vein as anticipated.  INDICATIONS:  The patient is a 66 year old man with longstanding history of lower tract urothelial carcinoma, status post many transurethral resections as well as BCG induction previously. He has been very compliant with surveillance of this. He was  found more recently to have significant volume renal pelvis cancer, urothelial type on the left side than on the right.  Volume is quite large and not amenable to endoscopic management and his disease is clinically localized.  His overall renal function  is quite good.  Options were discussed for management including curative, noncurative protocols and he desires to proceed with curative intent with left nephroureterectomy.  Informed consent was obtained and placed in medical record.  PROCEDURE IN DETAIL:  The patient being Ardell Makarewicz verified and procedure being left nephroureterectomy was confirmed.  Procedure timeout was performed.  Intravenous antibiotics were administered.  General endotracheal anesthesia induced.  The  patient was placed into a left side up full flank position and pulling 15 degrees of table flexion, superior arm elevator, axillary roll, sequential compression devices, bottom leg bent, top leg straight.  He was further fastened to operating  table using  3-inch tape with foam padding across supraxiphoid chest and his pelvis.  After clipper shaving and after Foley catheter was placed free to straight drain, chlorhexidine gluconate was used to develop a sterile field in his left abdomen and flank and high  flow, low pressure pneumoperitoneum was obtained using Veress technique in left lower quadrant having passed the aspiration and drop test.  An 8 mm robotic camera port was placed in position approximately 4 fingerbreadths superior lateral to the  umbilicus.  Laparoscopic examination of the peritoneal cavity revealed no significant adhesions, no visceral injury.  Additional ports were placed as follows:  Left subcostal 8 mm robotic port; left paramedian inferior robotic port, approximately 2  fingerbreadths lateral to the midline and 4 fingerbreadths superior to the pubic ramus, this was just lateral to the left median umbilical ligament via laparoscopic examination; left far lateral 8 mm robotic port, approximately 3 fingerbreadths superior  medial to the anterior iliac spine and two 12 mm assistant port sites in the midline, one approximately 2 fingerbreadths superior to the planned camera port and one 2 fingerbreadths inferior to the planned camera port.  Robot was docked and passed the  electronic checks.  Attention was directed at development of the left retroperitoneum.  Incision made lateral to the descending colon from the area of the splenic flexure towards the area of the internal ring and the colon was carefully swept medially.   There was some mild desmoplastic reaction between the colonic mesentery and Gerota's fascia, likely consistent with a prior ureteroscopy and retroperitoneal procedures, however, this did not appear to be concerning for locally advanced disease.  Then,  lateral splenic attachments were taken down, allowing the spleen and pancreas to  rotate medially away from the anterior surface of Gerota's fascia.  Lower pole  of the kidney area was identified, placed on gentle lateral traction.  Dissection proceeded  medial to this. The ureter and gonadal vessels were encountered.  The ureter had previously been stented making that advantageous in terms of manipulation of the ureter. Psoas musculature was identified and dissection proceeded within this triangle  towards the renal hilum.  Renal hilum consisted of single artery, single vein, renovascular anatomy was anticipated.  There was a very large lumbar vein entering the lower aspect.. The renal vein was controlled using Hem-o-Lok clip x2, proximal and one  distal.  The artery was actually best noted at the superior border of the vein.  It was circumferentially mobilized and extra-large Hem-o-lok clip placed proximally.  Next, the vein was controlled using vascular stapler and then finally the artery, again  using vascular stapler distal to the previously applied clip.  This resulted in excellent hemostatic control of the renal hilum. Superior medial attachments were taken down using a combination of cautery scissors and then two white loads of the vascular  stapler performing partial adrenal resection.  Superior attachments were taken down with cautery scissors as were lateral attachments, such that the left kidney was only tethered by the ureter and gonadal vessels. Ureter was marked with vessel loop,  dissected distally towards the area of the gonadal crossing at which point, the gonadal vessels were controlled using Hem-o-Lok clip x2.  Next, the posterior peritoneal incision was carried anteriorly by dissecting just lateral to the left median  umbilical ligament, thus creating a floor of dissection being this peritoneal flap and the ureter was carefully circumferentially mobilized towards the area of deep pelvis.  It was carefully mobilized circumferentially as it crossed the iliac vessels and  then distal to this towards the area of the ureterovesical junction.  There  was no obvious adenopathy or locally advanced disease.  Next, a stay suture of 3-0 V-Loc was placed approximately 1.5 cm medial to the insertion of the ureterovesical junction  ____ a stay stitch and formal bladder cuff was performed circumferentially dissecting out the ureter in a pluck technique. With this, the stent was inadvertently transected, such the distal end remained within the bladder lumen and the proximal end was  clearly with the nephroureterectomy specimen.  The ureter was clipped prior to the final transection of the ureter.  The very small cystotomy inherently created with the bladder cuff dissection was then oversewn using the previous 3-0 V-Loc stay suture,  which showed an excellent view reapproximation of the small cystotomy and bladder cuff.  At this point, hemostasis was excellent.  Sponge and needle counts were correct.  We achieved the goals of extirpative portion of the procedure today and the left  kidney, plus ureter plus bladder cuff specimen was placed into an extra-large EndoCatch bag for later retrieval.  Robot was undocked.  A closed suction drain was brought through the previous left lateral most robotic site into the area of peritoneal  cavity and sutured in place, connected to bulb suction and specimen was retrieved by connecting the previous assistant port sites in the midline and removing the left kidney, plus ureter plus bladder cuff en bloc specimen setting aside for permanent  pathology.  Notably, this contained a proximal end of the stent, this was visualized.  Extraction site was closed with fascia using figure-of-eight PDS x7 followed by reapproximation of Scarpa's with running Vicryl.  All incision sites were infiltrated  with dilute lipolyzed Marcaine and closed at the level of the skin using subcuticular Monocryl followed by Dermabond.  Bed was then leveled.  The patient was then made supine.  His in situ Foley catheter was removed and sterile field was created  at his  penis using iodine.  Attention was directed at retrieval of distal end of the stent.  Cystourethroscopy was performed using 16-French flexible cystoscope.  Inspection of anterior and posterior urethra unremarkable.  Inspection of urinary bladder revealed  no diverticula, calcifications or papillary lesions.  The free floating distal end of the stent was seen, it was easily grasped with cold graspers and removed in its entirety, set aside for discard after confirming the entire stent was removed visually.   A new Foley catheter was then placed free to straight drain, 10 mL water in the balloon.  Procedure was terminated.  The patient tolerated procedure well, no immediate periprocedural complications.  The patient was taken to postanesthesia care unit in  stable condition.  Plan for inpatient admission.  First assistant, Lamar Laundry, MD was available for this surgery.  He provided retraction, suctioning, robotic instrument exchange, vascular clipping, vascular stapling and general first assistance.   NIK D: 05/10/2022 4:12:32 pm T: 05/10/2022 10:08:00 pm  JOB: [id number]/ [id number]

## 2022-05-10 NOTE — Anesthesia Procedure Notes (Signed)
Procedure Name: Intubation Date/Time: 05/10/2022 1:14 PM  Performed by: Claudia Desanctis, CRNAPre-anesthesia Checklist: Patient identified, Emergency Drugs available, Suction available and Patient being monitored Patient Re-evaluated:Patient Re-evaluated prior to induction Oxygen Delivery Method: Circle system utilized Preoxygenation: Pre-oxygenation with 100% oxygen Induction Type: IV induction Ventilation: Mask ventilation without difficulty Laryngoscope Size: Miller and 3 Grade View: Grade I Tube type: Oral Tube size: 7.5 mm Number of attempts: 1 Airway Equipment and Method: Stylet Placement Confirmation: ETT inserted through vocal cords under direct vision, positive ETCO2 and breath sounds checked- equal and bilateral Secured at: 22 cm Tube secured with: Tape Dental Injury: Teeth and Oropharynx as per pre-operative assessment

## 2022-05-10 NOTE — Brief Op Note (Signed)
05/10/2022  4:00 PM  PATIENT:  Harry Young  66 y.o. male  PRE-OPERATIVE DIAGNOSIS:  LEFT RENAL PELVIS CANCER  POST-OPERATIVE DIAGNOSIS:  LEFT RENAL PELVIS CANCER  PROCEDURE:  Procedure(s): XI ROBOT ASSITED LAPAROSCOPIC NEPHROURETERECTOMY (Left)  SURGEON:  Surgeon(s) and Role:    Alexis Frock, MD - Primary  PHYSICIAN ASSISTANT:   ASSISTANTS: Lamar Laundry MD   ANESTHESIA:   local and general  EBL:  50 mL   BLOOD ADMINISTERED:none  DRAINS:  1 - foley to gravity; 2 - JP to bulb    LOCAL MEDICATIONS USED:  MARCAINE     SPECIMEN:  Source of Specimen:  LEFT kidney + ureter + bladder cuff en bloc  DISPOSITION OF SPECIMEN:  PATHOLOGY  COUNTS:  YES  TOURNIQUET:  * No tourniquets in log *  DICTATION: .Other Dictation: Dictation Number [id number]  PLAN OF CARE: Admit to inpatient   PATIENT DISPOSITION:  PACU - hemodynamically stable.   Delay start of Pharmacological VTE agent (>24hrs) due to surgical blood loss or risk of bleeding: yes

## 2022-05-11 ENCOUNTER — Encounter (HOSPITAL_COMMUNITY): Payer: Self-pay | Admitting: Urology

## 2022-05-11 LAB — BASIC METABOLIC PANEL
Anion gap: 7 (ref 5–15)
BUN: 19 mg/dL (ref 8–23)
CO2: 26 mmol/L (ref 22–32)
Calcium: 8.8 mg/dL — ABNORMAL LOW (ref 8.9–10.3)
Chloride: 104 mmol/L (ref 98–111)
Creatinine, Ser: 1.34 mg/dL — ABNORMAL HIGH (ref 0.61–1.24)
GFR, Estimated: 58 mL/min — ABNORMAL LOW (ref >60)
Glucose, Bld: 151 mg/dL — ABNORMAL HIGH (ref 70–99)
Potassium: 4.4 mmol/L (ref 3.5–5.1)
Sodium: 137 mmol/L (ref 135–145)

## 2022-05-11 LAB — HEMOGLOBIN AND HEMATOCRIT, BLOOD
HCT: 38.1 % — ABNORMAL LOW (ref 39.0–52.0)
Hemoglobin: 12.8 g/dL — ABNORMAL LOW (ref 13.0–17.0)

## 2022-05-11 MED ORDER — OXYCODONE HCL 5 MG PO TABS
10.0000 mg | ORAL_TABLET | ORAL | Status: DC | PRN
  Administered 2022-05-11 – 2022-05-12 (×2): 10 mg via ORAL
  Filled 2022-05-11 (×2): qty 2

## 2022-05-11 MED ORDER — FAMOTIDINE IN NACL 20-0.9 MG/50ML-% IV SOLN
20.0000 mg | INTRAVENOUS | Status: DC
  Administered 2022-05-11: 20 mg via INTRAVENOUS
  Filled 2022-05-11 (×2): qty 50

## 2022-05-11 MED ORDER — ACETAMINOPHEN 500 MG PO TABS
1000.0000 mg | ORAL_TABLET | Freq: Four times a day (QID) | ORAL | Status: DC
  Administered 2022-05-11 – 2022-05-12 (×3): 1000 mg via ORAL
  Filled 2022-05-11 (×3): qty 2

## 2022-05-11 NOTE — TOC Initial Note (Signed)
Transition of Care [facility]) - Initial/Assessment Note    Patient Details  Name: Harry Young MRN: [id number] Date of Birth: [birth year]  [facility] CM/SW Contact:    Leeroy Cha, RN Phone Number: 05/11/2022, 8:02 AM  Clinical Narrative:                 Information on smoking and the body effects and cessation added to the dc instructions.  Expected Discharge Plan: Home/Self Care Barriers to Discharge: Continued Medical Work up   Patient Goals and CMS Choice Patient states their goals for this hospitalization and ongoing recovery are:: to go home and get better CMS Medicare.gov Compare Post Acute Care list provided to:: Patient        Expected Discharge Plan and Services   Discharge Planning Services: CM Consult   Living arrangements for the past 2 months: [place]                                      Prior Living Arrangements/Services Living arrangements for the past 2 months: Single Family Home Lives with:: Spouse Patient language and need for interpreter reviewed:: Yes Do you feel safe going back to the place where you live?: Yes            Criminal Activity/Legal Involvement Pertinent to Current Situation/Hospitalization: No - Comment as needed  Activities of Daily Living Home Assistive Devices/Equipment: Dentures (specify type) ADL Screening (condition at time of admission) Patient's cognitive ability adequate to safely complete daily activities?: Yes Is the patient deaf or have difficulty hearing?: No Does the patient have difficulty seeing, even when wearing glasses/contacts?: No Does the patient have difficulty concentrating, remembering, or making decisions?: No Patient able to express need for assistance with ADLs?: Yes Does the patient have difficulty dressing or bathing?: No Independently performs ADLs?: Yes (appropriate for developmental age) Does the patient have difficulty walking or climbing stairs?: No Weakness  of Legs: None Weakness of Arms/Hands: None  Permission Sought/Granted                  Emotional Assessment Appearance:: Appears stated age Attitude/Demeanor/Rapport: Engaged Affect (typically observed): Calm Orientation: : Oriented to Self, Oriented to Place, Oriented to  Time, Oriented to Situation Alcohol / Substance Use: Tobacco Use (current smoker) Psych Involvement: No (comment)  Admission diagnosis:  Urothelial carcinoma of kidney, left ([place]) [C64.2] Patient Active Problem List   Diagnosis Date Noted   Urothelial carcinoma of kidney, left ([place]) 05/10/2022   Pulmonary nodule 01/09/2022   Abnormal electrocardiogram 11/28/2021   Anxiety disorder, unspecified 11/28/2021   Bladder stones 11/28/2021   COPD (chronic obstructive pulmonary disease) ([place]) 11/28/2021   History of kidney stones 11/28/2021   Hypothyroidism, unspecified 11/28/2021   Low back pain radiating to left leg 11/28/2021   Nocturia 11/28/2021   Other chronic pain 11/28/2021   Productive cough 11/28/2021   Smokers' cough ([place]) 11/28/2021   Transitional cell carcinoma of bladder ([place]) 11/28/2021   Urgency of urination 11/28/2021   Vitamin D deficiency 11/28/2021   Mixed hyperlipidemia 07/28/2019   Essential (primary) hypertension 04/03/2019   Coronary artery disease involving native coronary artery of native heart without angina pectoris 04/03/2019   Exertional dyspnea 02/22/2019   Nonrheumatic aortic valve stenosis 02/22/2019   Nonrheumatic aortic valve insufficiency 02/22/2019   Asymmetrical hearing loss of left ear 01/03/2018   Smokes 01/03/2018   Golfer's elbow,  right 03/30/2016   Bilateral carotid artery stenosis 08/07/2015   Constipation 08/07/2015   Increased urinary frequency 08/07/2015   Joint swelling 08/07/2015   Leg weakness 08/07/2015   Screen for colon cancer 08/07/2015   Neoplasm of bladder 08/07/2015   Hyperlipidemia 08/07/2015   Hypothyroidism due to Hashimoto's thyroiditis  07/15/2015   Spinal stenosis of lumbar region 05/19/2015   Lumbosacral neuritis 06/24/2014   Lumbar radiculopathy 10/21/2013   Displacement of lumbar intervertebral disc without myelopathy 04/14/2013   Lumbosacral radiculitis 04/14/2013   Bladder tumor 04/04/2013   PCP:  Deon Pilling, NP Pharmacy:   [facility] [place], [place] - [address] [place] [address] Phone: [phone] Fax: [phone]     Social Determinants of Health ([place]) Social History: [place]: No Food Insecurity (05/10/2022)  Housing: Low Risk  (05/10/2022)  Transportation Needs: No Transportation Needs (05/10/2022)  Utilities: Not At Risk (05/10/2022)  Tobacco Use: High Risk (05/11/2022)   SDOH Interventions:     Readmission Risk Interventions   No data to display

## 2022-05-11 NOTE — Progress Notes (Signed)
Mobility Specialist - Progress Note   05/11/22 1527  Mobility  Activity Ambulated independently in hallway  Level of Assistance Contact guard assist, steadying assist  Assistive Device None  Distance Ambulated (ft) 250 ft  Range of Motion/Exercises Active  Activity Response Tolerated well  Mobility Referral Yes  $Mobility charge 1 Mobility   Pt was found on recliner chair and agreeable to try ambulation. C/o abdominal pain when coughing and as session progressed abdominal pain increased. Pt took x1 standing rest break due to the pain increasing and attempted ambulation again but stated he could not make it back to the room. Pt than sat on chair and was rolled back to room and ambulated back to recliner chair. At EOS was left with necessities and RN in room.  Ferd Hibbs Mobility Specialist

## 2022-05-11 NOTE — Progress Notes (Signed)
1 Day Post-Op   Subjective/Chief Complaint:  1 - LEFT Renal Pelvis Cancer - s/p LEFT robotic nephroureterectomy on 05/10/22. Path penidng. POD 1 Cr 1.3, Hgb 12.8, and advanced to regular diet.  Today "Harry Young" is progressing. Tolerated clears this AM, now having regular lunch. Sore with transition or cough, otherwise pain controlled.    Objective: Vital signs in last 24 hours: Temp:  [95.9 F (35.5 C)-98.6 F (37 C)] 98.2 F (36.8 C) (12/28 0839) Pulse Rate:  [63-85] 63 (12/28 0839) Resp:  [11-18] 16 (12/28 0538) BP: (123-138)/(50-60) 136/59 (12/28 0839) SpO2:  [90 %-100 %] 94 % (12/28 0846) Weight:  [62.7 kg] 62.7 kg (12/27 1727) Last BM Date : 05/09/22  Intake/Output from previous day: 12/27 0701 - 12/28 0700 In: 3709.3 [P.O.:360; I.V.:3149.3; IV Piggyback:200] Out: 1270 [Urine:1150; Drains:70; Blood:50] Intake/Output this shift: No intake/output data recorded.   NAD, AOx3, sitting up in chair having lunch. Wife at bedside. NCAT Non-labored breathing on minimal NCO2 RRR SNTND, recnet port/extraction sites c/d/I. JP scant non-foul serosanguinous output. Foley in place with yellow urine SCD's in place  Lab Results:  Recent Labs    05/10/22 1616 05/11/22 0452  HGB 13.8 12.8*  HCT 41.1 38.1*   BMET Recent Labs    05/11/22 0452  NA 137  K 4.4  CL 104  CO2 26  GLUCOSE 151*  BUN 19  CREATININE 1.34*  CALCIUM 8.8*   PT/INR No results for input(s): "LABPROT", "INR" in the last 72 hours. ABG No results for input(s): "PHART", "HCO3" in the last 72 hours.  Invalid input(s): "PCO2", "PO2"  Studies/Results: No results found.  Anti-infectives: Anti-infectives (From admission, onward)    Start     Dose/Rate Route Frequency Ordered Stop   05/10/22 2100  ceFAZolin (ANCEF) IVPB 1 g/50 mL premix        1 g 100 mL/hr over 30 Minutes Intravenous Every 8 hours 05/10/22 1725 05/11/22 0555   05/10/22 1058  ceFAZolin (ANCEF) IVPB 2g/100 mL premix        2 g 200  mL/hr over 30 Minutes Intravenous 30 min pre-op 05/10/22 1059 05/10/22 1303       Assessment/Plan:  Doing well POD 1. Goals for DC discussed, likely tomorrow based on current progress. SLIV, Ambulate, Reg Diet, Keep foley for now and at DC.    Alexis Frock 05/11/2022

## 2022-05-12 LAB — BASIC METABOLIC PANEL
Anion gap: 3 — ABNORMAL LOW (ref 5–15)
BUN: 17 mg/dL (ref 8–23)
CO2: 28 mmol/L (ref 22–32)
Calcium: 8.9 mg/dL (ref 8.9–10.3)
Chloride: 106 mmol/L (ref 98–111)
Creatinine, Ser: 1.41 mg/dL — ABNORMAL HIGH (ref 0.61–1.24)
GFR, Estimated: 55 mL/min — ABNORMAL LOW (ref >60)
Glucose, Bld: 122 mg/dL — ABNORMAL HIGH (ref 70–99)
Potassium: 4.4 mmol/L (ref 3.5–5.1)
Sodium: 137 mmol/L (ref 135–145)

## 2022-05-12 LAB — HEMOGLOBIN AND HEMATOCRIT, BLOOD
HCT: 39.2 % (ref 39.0–52.0)
Hemoglobin: 13 g/dL (ref 13.0–17.0)

## 2022-05-12 MED ORDER — CHLORHEXIDINE GLUCONATE CLOTH 2 % EX PADS
6.0000 | MEDICATED_PAD | Freq: Every day | CUTANEOUS | Status: DC
  Administered 2022-05-12: 6 via TOPICAL

## 2022-05-12 NOTE — TOC Transition Note (Signed)
Transition of Care [facility]) - CM/SW Discharge Note   Patient Details  Name: Harry Young MRN: [id number] Date of Birth: 1956-02-11  [facility] CM/SW Contact:  Leeroy Cha, RN Phone Number: 05/12/2022, 9:32 AM   Clinical Narrative:    Patient discharged to return home with self care.   Final next level of care: Home/Self Care Barriers to Discharge: Barriers Resolved   Patient Goals and CMS Choice CMS Medicare.gov Compare Post Acute Care list provided to:: Patient    Discharge Placement                         Discharge Plan and Services Additional resources added to the After Visit Summary for     Discharge Planning Services: CM Consult                                 Social Determinants of Health (SDOH) Interventions SDOH Screenings   Food Insecurity: No Food Insecurity (05/10/2022)  Housing: Low Risk  (05/10/2022)  Transportation Needs: No Transportation Needs (05/10/2022)  Utilities: Not At Risk (05/10/2022)  Tobacco Use: High Risk (05/11/2022)     Readmission Risk Interventions   No data to display

## 2022-05-12 NOTE — Discharge Summary (Signed)
Date of admission: 05/10/2022  Date of discharge: 05/12/2022  Admission diagnosis: Upper tract urothelial cancer  Discharge diagnosis: Upper tract urothelial cancer  Secondary diagnoses: None  History and Physical: For full details, please see admission history and physical. Briefly, Harry Young is a 66 y.o. year old patient with a PMH of T1 bladder cancer s/p BCG and newly diagnosed L UTUC who presented for robotic left nephroureterectomy.  Hospital Course: Patient underwent robotic left nephroureterectomy with Dr. Tresa Moore on 05/10/22. He tolerated the procedure well. Post-operatively he was transferred to the floor for routine post-operative care. On POD#1, patient was doing well but reported abdominal pain that limited his ability to ambulate. He was otherwise stable with unremarkable vital signs and a benign exam. His catheter was draining clear yellow and JP drain was with minimal serosanguinous output. Labs demonstrated Hgb 12.8 and Cr 1.34. He was kept overnight for continued observation. By POD#2, patient's pain was improved and he was tolerating a regular diet without issue. He was ambulating without difficulty. Labs demonstrated a stable Hgb and Cr 1.41. He was deemed appropriate for discharge and his JP was removed prior to leaving.  Laboratory values:  Recent Labs    05/10/22 1616 05/11/22 0452 05/12/22 0424  HGB 13.8 12.8* 13.0  HCT 41.1 38.1* 39.2   Recent Labs    05/11/22 0452 05/12/22 0424  CREATININE 1.34* 1.41*    Disposition: Home  Discharge instruction: The patient was instructed to be ambulatory but told to refrain from heavy lifting, strenuous activity, or driving. Continue foley catheter to drainage and call [facility] for concerns with decreased output or blood clots within tubing.  Discharge medications:  Allergies as of 05/12/2022       Reactions   Crestor [rosuvastatin] Other (See Comments)   Urinary retention.   Sulfa Antibiotics Rash         Medication List     STOP taking these medications    Anoro Ellipta 62.5-25 MCG/ACT Aepb Generic drug: umeclidinium-vilanterol   traMADol 50 MG tablet Commonly known as: Ultram       TAKE these medications    albuterol 108 (90 Base) MCG/ACT inhaler Commonly known as: VENTOLIN HFA Inhale 1-2 puffs into the lungs every 6 (six) hours as needed for shortness of breath or wheezing.   amLODipine 5 MG tablet Commonly known as: NORVASC TAKE 1 TABLET BY MOUTH EVERY DAY   aspirin EC 81 MG tablet Take 81 mg by mouth daily at 12 noon.   Breo Ellipta 200-25 MCG/ACT Aepb Generic drug: fluticasone furoate-vilanterol Inhale 1 puff into the lungs daily.   CAL-MAG-ZINC PO Take 1 tablet by mouth daily.   ezetimibe 10 MG tablet Commonly known as: ZETIA Take 10 mg by mouth daily.   Fish Oil 1200 MG Caps Take 1,200 mg by mouth daily.   HYDROcodone-acetaminophen 10-325 MG tablet Commonly known as: NORCO Take 1 tablet by mouth at bedtime as needed for moderate pain.   levothyroxine 112 MCG tablet Commonly known as: SYNTHROID Take 112 mcg by mouth daily before breakfast.   RESTFUL LEGS SL Place 1 tablet under the tongue at bedtime as needed (restless legs).   sildenafil 50 MG tablet Commonly known as: VIAGRA Take 50 mg by mouth daily as needed for erectile dysfunction.   simvastatin 10 MG tablet Commonly known as: ZOCOR Take 10 mg by mouth 3 (three) times a week.   Vitamin D3 25 MCG (1000 UT) Caps Take 1,000 Units by mouth daily with supper.  Followup:   Follow-up Information     Alexis Frock, MD Follow up on 05/22/2022.   Specialty: Urology Why: at 1:15 for X-Ray, pathology review, MD visit, and catheter removal. Contact information: [address] [place] [address] [phone]

## 2022-05-12 NOTE — Progress Notes (Signed)
Patient discharge summary explained to patient and wife at bedside. Patient's foley catheter exchanged from a standard drainage bag to a leg bag. Patient and wife educated on how to exchange from leg bag to standard drainage bag at night to accommodate volume. All questions answered regarding follow-up appointment and medication regimen after discharge. Patient safely transported by NA in wheelchair to elevator for dc at main entrance.

## 2022-05-13 LAB — SURGICAL PATHOLOGY

## 2023-04-10 ENCOUNTER — Other Ambulatory Visit: Payer: Self-pay | Admitting: Urology

## 2023-04-23 NOTE — Patient Instructions (Signed)
SURGICAL WAITING ROOM VISITATION  Patients having surgery or a procedure may have no more than 2 support people in the waiting area - these visitors may rotate.    Children under the age of 74 must have an adult with them who is not the patient.   If the patient needs to stay at the hospital during part of their recovery, the visitor guidelines for inpatient rooms apply. Pre-op nurse will coordinate an appropriate time for 1 support person to accompany patient in pre-op.  This support person may not rotate.    Please refer to the [facility] website for the visitor guidelines for Inpatients (after your surgery is over and you are in a regular room).       Your procedure is scheduled on: 04-27-23   Report to [facility] Main Entrance    Report to admitting at        12:45  PM   Call this number if you have problems the morning of surgery [phone]   Do not eat food :After Midnight.   After Midnight you may have the following liquids until __0900____ AM/  DAY OF SURGERY  then nothing by mouth  Water Non-Citrus Juices (without pulp, NO RED-Apple, White grape, White cranberry) Black Coffee (NO MILK/CREAM OR CREAMERS, sugar ok)  Clear Tea (NO MILK/CREAM OR CREAMERS, sugar ok) regular and decaf                             Plain Jell-O (NO RED)                                           Fruit ices (not with fruit pulp, NO RED)                                     Popsicles (NO RED)                                                               Sports drinks like Gatorade (NO RED)                            If you have questions, please contact your surgeon's office.   FOLLOW  any BOWEL PREP or  ANY ADDITIONAL PRE OP INSTRUCTIONS YOU RECEIVED FROM YOUR SURGEON'S OFFICE!!!     Oral Hygiene is also important to reduce your risk of infection.                                    Remember - BRUSH YOUR TEETH THE MORNING OF SURGERY WITH YOUR REGULAR TOOTHPASTE  DENTURES WILL  BE REMOVED PRIOR TO SURGERY PLEASE DO NOT APPLY "Poly grip" OR ADHESIVES!!!   Do NOT smoke after Midnight   Stop all vitamins and herbal supplements 7 days before surgery.   Take these medicines the morning of surgery with A SIP OF WATER: levothyroxine, hydrocodone if needed, ezetimibe(zetia), inhaler and  bring your rescue inhaler      with you, amlodipine                                  You may not have any metal on your body including hair pins, jewelry, and body piercing             Do not wear  lotions, powders, cologne, or deodorant                 Men may shave face and neck.   Do not bring valuables to the hospital. [facility] IS NOT             RESPONSIBLE   FOR VALUABLES.   Contacts, glasses, dentures or bridgework may not be worn into surgery.   Bring small overnight bag day of surgery.   DO NOT BRING YOUR HOME MEDICATIONS TO THE HOSPITAL. PHARMACY WILL DISPENSE MEDICATIONS LISTED ON YOUR MEDICATION LIST TO YOU DURING YOUR ADMISSION IN THE HOSPITAL!    Patients discharged on the day of surgery will not be allowed to drive home.  Someone NEEDS to stay with you for the first 24 hours after anesthesia.   Special Instructions: Bring a copy of your healthcare power of attorney and living will documents the day of surgery if you haven't scanned them before.              Please read over the following fact sheets you were given: IF YOU HAVE QUESTIONS ABOUT YOUR PRE-OP INSTRUCTIONS PLEASE CALL [phone]   If you test positive for Covid or have been in contact with anyone that has tested positive in the last 10 days please notify you surgeon.    [facility] - Preparing for Surgery Before surgery, you can play an important role.  Because skin is not sterile, your skin needs to be as free of germs as possible.  You can reduce the number of germs on your skin by washing with CHG (chlorahexidine gluconate) soap before surgery.  CHG is an antiseptic cleaner which kills germs  and bonds with the skin to continue killing germs even after washing. Please DO NOT use if you have an allergy to CHG or antibacterial soaps.  If your skin becomes reddened/irritated stop using the CHG and inform your nurse when you arrive at Short Stay. Do not shave (including legs and underarms) for at least 48 hours prior to the first CHG shower.  You may shave your face/neck. Please follow these instructions carefully:  1.  Shower with CHG Soap the night before surgery and the  morning of Surgery.  2.  If you choose to wash your hair, wash your hair first as usual with your  normal  shampoo.  3.  After you shampoo, rinse your hair and body thoroughly to remove the  shampoo.                           4.  Use CHG as you would any other liquid soap.  You can apply chg directly  to the skin and wash                       Gently with a scrungie or clean washcloth.  5.  Apply the CHG Soap to your body ONLY FROM THE NECK DOWN.   Do not use on face/ open  Wound or open sores. Avoid contact with eyes, ears mouth and genitals (private parts).                       Wash face,  Genitals (private parts) with your normal soap.             6.  Wash thoroughly, paying special attention to the area where your surgery  will be performed.  7.  Thoroughly rinse your body with warm water from the neck down.  8.  DO NOT shower/wash with your normal soap after using and rinsing off  the CHG Soap.                9.  Pat yourself dry with a clean towel.            10.  Wear clean pajamas.            11.  Place clean sheets on your bed the night of your first shower and do not  sleep with pets. Day of Surgery : Do not apply any lotions/deodorants the morning of surgery.  Please wear clean clothes to the hospital/surgery center.  FAILURE TO FOLLOW THESE INSTRUCTIONS MAY RESULT IN THE CANCELLATION OF YOUR SURGERY PATIENT SIGNATURE_________________________________  NURSE  SIGNATURE__________________________________  ________________________________________________________________________

## 2023-04-23 NOTE — Progress Notes (Addendum)
PCP - Linus Galas, NP Cardiologist - Truett Mainland, MD  PPM/ICD -  Device Orders -  Rep Notified -   Chest x-ray - CT chest 03-31-22 epic EKG -  Stress Test - 2020 ECHO - 08-01-20 epic Cardiac Cath -   Sleep Study -  CPAP -   Fasting Blood Sugar -  Checks Blood Sugar _____ times a day  Blood Thinner Instructions: Aspirin Instructions:81 mg asa  ERAS Protcol - PRE-SURGERY  n/a   COVID vaccine -  Activity-- Anesthesia review: CAD, murmur, COPD, mild aortic stenosis, HTN  Patient denies shortness of breath, fever, cough and chest pain at PAT appointment   All instructions explained to the patient, with a verbal understanding of the material. Patient agrees to go over the instructions while at home for a better understanding. Patient also instructed to self quarantine after being tested for COVID-19. The opportunity to ask questions was provided.

## 2023-04-25 ENCOUNTER — Encounter (HOSPITAL_COMMUNITY): Payer: Self-pay

## 2023-04-25 ENCOUNTER — Encounter (HOSPITAL_COMMUNITY)
Admission: RE | Admit: 2023-04-25 | Discharge: 2023-04-25 | Disposition: A | Discharge status: Home / Self Care | Payer: Medicare Other | Source: Ambulatory Visit | Attending: Urology | Admitting: Urology

## 2023-04-25 ENCOUNTER — Other Ambulatory Visit: Payer: Self-pay

## 2023-04-25 VITALS — BP 109/63 | HR 64 | Temp 98.7°F | Resp 16 | Ht 70.0 in | Wt 123.0 lb

## 2023-04-25 DIAGNOSIS — I10 Essential (primary) hypertension: Secondary | ICD-10-CM | POA: Insufficient documentation

## 2023-04-25 DIAGNOSIS — Z01818 Encounter for other preprocedural examination: Secondary | ICD-10-CM | POA: Diagnosis present

## 2023-04-25 DIAGNOSIS — N210 Calculus in bladder: Secondary | ICD-10-CM

## 2023-04-25 HISTORY — DX: Other complications of anesthesia, initial encounter: T88.59XA

## 2023-04-25 HISTORY — DX: Unspecified osteoarthritis, unspecified site: M19.90

## 2023-04-25 HISTORY — DX: Acquired absence of kidney: Z90.5

## 2023-04-25 LAB — CBC
HCT: 41.2 % (ref 39.0–52.0)
Hemoglobin: 13.9 g/dL (ref 13.0–17.0)
MCH: 33.1 pg (ref 26.0–34.0)
MCHC: 33.7 g/dL (ref 30.0–36.0)
MCV: 98.1 fL (ref 80.0–100.0)
Platelets: 197 10*3/uL (ref 150–400)
RBC: 4.2 MIL/uL — ABNORMAL LOW (ref 4.22–5.81)
RDW: 14 % (ref 11.5–15.5)
WBC: 8.6 10*3/uL (ref 4.0–10.5)
nRBC: 0 % (ref 0.0–0.2)

## 2023-04-25 LAB — BASIC METABOLIC PANEL
Anion gap: 6 (ref 5–15)
BUN: 22 mg/dL (ref 8–23)
CO2: 29 mmol/L (ref 22–32)
Calcium: 9.2 mg/dL (ref 8.9–10.3)
Chloride: 101 mmol/L (ref 98–111)
Creatinine, Ser: 1.7 mg/dL — ABNORMAL HIGH (ref 0.61–1.24)
GFR, Estimated: 43 mL/min — ABNORMAL LOW (ref >60)
Glucose, Bld: 93 mg/dL (ref 70–99)
Potassium: 4.5 mmol/L (ref 3.5–5.1)
Sodium: 136 mmol/L (ref 135–145)

## 2023-04-27 ENCOUNTER — Ambulatory Visit (HOSPITAL_COMMUNITY): Payer: Medicare Other | Admitting: Physician Assistant

## 2023-04-27 ENCOUNTER — Ambulatory Visit (HOSPITAL_COMMUNITY): Payer: Medicare Other

## 2023-04-27 ENCOUNTER — Ambulatory Visit (HOSPITAL_BASED_OUTPATIENT_CLINIC_OR_DEPARTMENT_OTHER): Payer: Medicare Other | Admitting: Anesthesiology

## 2023-04-27 ENCOUNTER — Other Ambulatory Visit: Payer: Self-pay

## 2023-04-27 ENCOUNTER — Encounter (HOSPITAL_COMMUNITY): Payer: Self-pay | Admitting: Urology

## 2023-04-27 ENCOUNTER — Ambulatory Visit (HOSPITAL_COMMUNITY)
Admission: RE | Admit: 2023-04-27 | Discharge: 2023-04-27 | Disposition: A | Discharge status: Home / Self Care | Payer: Medicare Other | Source: Ambulatory Visit | Attending: Urology | Admitting: Urology

## 2023-04-27 ENCOUNTER — Encounter (HOSPITAL_COMMUNITY)
Admission: RE | Disposition: A | Discharge status: Home / Self Care | Payer: Self-pay | Source: Ambulatory Visit | Attending: Urology

## 2023-04-27 DIAGNOSIS — I10 Essential (primary) hypertension: Secondary | ICD-10-CM | POA: Diagnosis not present

## 2023-04-27 DIAGNOSIS — Z905 Acquired absence of kidney: Secondary | ICD-10-CM | POA: Diagnosis not present

## 2023-04-27 DIAGNOSIS — I2510 Atherosclerotic heart disease of native coronary artery without angina pectoris: Secondary | ICD-10-CM | POA: Diagnosis not present

## 2023-04-27 DIAGNOSIS — J4489 Other specified chronic obstructive pulmonary disease: Secondary | ICD-10-CM | POA: Insufficient documentation

## 2023-04-27 DIAGNOSIS — C679 Malignant neoplasm of bladder, unspecified: Secondary | ICD-10-CM | POA: Diagnosis present

## 2023-04-27 DIAGNOSIS — F17200 Nicotine dependence, unspecified, uncomplicated: Secondary | ICD-10-CM | POA: Diagnosis not present

## 2023-04-27 DIAGNOSIS — Z8249 Family history of ischemic heart disease and other diseases of the circulatory system: Secondary | ICD-10-CM | POA: Insufficient documentation

## 2023-04-27 HISTORY — PX: TRANSURETHRAL RESECTION OF BLADDER TUMOR: SHX2575

## 2023-04-27 HISTORY — PX: CYSTOSCOPY W/ RETROGRADES: SHX1426

## 2023-04-27 SURGERY — CYSTOSCOPY, WITH RETROGRADE PYELOGRAM
Anesthesia: General | Site: Ureter | Laterality: Right

## 2023-04-27 MED ORDER — ONDANSETRON HCL 4 MG/2ML IJ SOLN
INTRAMUSCULAR | Status: AC
  Filled 2023-04-27: qty 2

## 2023-04-27 MED ORDER — CHLORHEXIDINE GLUCONATE 0.12 % MT SOLN
15.0000 mL | Freq: Once | OROMUCOSAL | Status: AC
  Administered 2023-04-27: 15 mL via OROMUCOSAL

## 2023-04-27 MED ORDER — SODIUM CHLORIDE 0.9 % IV SOLN: INTRAVENOUS | Status: DC

## 2023-04-27 MED ORDER — MIDAZOLAM HCL 5 MG/5ML IJ SOLN
INTRAMUSCULAR | Status: DC | PRN
  Administered 2023-04-27: 2 mg via INTRAVENOUS

## 2023-04-27 MED ORDER — GENTAMICIN SULFATE 40 MG/ML IJ SOLN
5.0000 mg/kg | INTRAVENOUS | Status: AC
  Administered 2023-04-27: 280 mg via INTRAVENOUS
  Filled 2023-04-27: qty 7

## 2023-04-27 MED ORDER — OXYCODONE HCL 5 MG PO TABS: 5.0000 mg | ORAL_TABLET | Freq: Four times a day (QID) | ORAL | 0 refills | Status: DC | PRN

## 2023-04-27 MED ORDER — MIDAZOLAM HCL 2 MG/2ML IJ SOLN
INTRAMUSCULAR | Status: AC
  Filled 2023-04-27: qty 2

## 2023-04-27 MED ORDER — HYDROMORPHONE HCL 1 MG/ML IJ SOLN: 0.2500 mg | INTRAMUSCULAR | Status: DC | PRN

## 2023-04-27 MED ORDER — ONDANSETRON HCL 4 MG/2ML IJ SOLN: 4.0000 mg | Freq: Once | INTRAMUSCULAR | Status: DC | PRN

## 2023-04-27 MED ORDER — ACETAMINOPHEN 10 MG/ML IV SOLN: 1000.0000 mg | Freq: Once | INTRAVENOUS | Status: DC | PRN

## 2023-04-27 MED ORDER — IOHEXOL 300 MG/ML  SOLN
INTRAMUSCULAR | Status: DC | PRN
  Administered 2023-04-27: 20 mL

## 2023-04-27 MED ORDER — LIDOCAINE HCL (PF) 2 % IJ SOLN
INTRAMUSCULAR | Status: AC
  Filled 2023-04-27: qty 5

## 2023-04-27 MED ORDER — ROCURONIUM BROMIDE 10 MG/ML (PF) SYRINGE
PREFILLED_SYRINGE | INTRAVENOUS | Status: AC
  Filled 2023-04-27: qty 10

## 2023-04-27 MED ORDER — SODIUM CHLORIDE 0.9 % IR SOLN
Status: DC | PRN
  Administered 2023-04-27 (×2): 3000 mL

## 2023-04-27 MED ORDER — 0.9 % SODIUM CHLORIDE (POUR BTL) OPTIME
TOPICAL | Status: DC | PRN
  Administered 2023-04-27: 1000 mL

## 2023-04-27 MED ORDER — PHENYLEPHRINE HCL (PRESSORS) 10 MG/ML IV SOLN
INTRAVENOUS | Status: DC | PRN
  Administered 2023-04-27: 80 ug via INTRAVENOUS

## 2023-04-27 MED ORDER — DEXAMETHASONE SODIUM PHOSPHATE 10 MG/ML IJ SOLN
INTRAMUSCULAR | Status: AC
  Filled 2023-04-27: qty 1

## 2023-04-27 MED ORDER — PROPOFOL 10 MG/ML IV BOLUS
INTRAVENOUS | Status: AC
  Filled 2023-04-27: qty 20

## 2023-04-27 MED ORDER — EPHEDRINE 5 MG/ML INJ
INTRAVENOUS | Status: AC
  Filled 2023-04-27: qty 5

## 2023-04-27 MED ORDER — PROPOFOL 10 MG/ML IV BOLUS
INTRAVENOUS | Status: DC | PRN
  Administered 2023-04-27: 120 mg via INTRAVENOUS

## 2023-04-27 MED ORDER — OXYCODONE HCL 5 MG/5ML PO SOLN: 5.0000 mg | Freq: Once | ORAL | Status: DC | PRN

## 2023-04-27 MED ORDER — ORAL CARE MOUTH RINSE: 15.0000 mL | Freq: Once | OROMUCOSAL | Status: AC

## 2023-04-27 MED ORDER — FENTANYL CITRATE (PF) 100 MCG/2ML IJ SOLN
INTRAMUSCULAR | Status: AC
  Filled 2023-04-27: qty 2

## 2023-04-27 MED ORDER — LIDOCAINE 2% (20 MG/ML) 5 ML SYRINGE
INTRAMUSCULAR | Status: DC | PRN
  Administered 2023-04-27: 100 mg via INTRAVENOUS

## 2023-04-27 MED ORDER — FENTANYL CITRATE (PF) 100 MCG/2ML IJ SOLN
INTRAMUSCULAR | Status: DC | PRN
  Administered 2023-04-27: 50 ug via INTRAVENOUS

## 2023-04-27 MED ORDER — EPHEDRINE SULFATE-NACL 50-0.9 MG/10ML-% IV SOSY
PREFILLED_SYRINGE | INTRAVENOUS | Status: DC | PRN
  Administered 2023-04-27: 10 mg via INTRAVENOUS

## 2023-04-27 MED ORDER — ONDANSETRON HCL 4 MG/2ML IJ SOLN
INTRAMUSCULAR | Status: DC | PRN
  Administered 2023-04-27: 4 mg via INTRAVENOUS

## 2023-04-27 MED ORDER — DEXAMETHASONE SODIUM PHOSPHATE 10 MG/ML IJ SOLN
INTRAMUSCULAR | Status: DC | PRN
  Administered 2023-04-27: 10 mg via INTRAVENOUS

## 2023-04-27 MED ORDER — OXYCODONE HCL 5 MG PO TABS: 5.0000 mg | ORAL_TABLET | Freq: Once | ORAL | Status: DC | PRN

## 2023-04-27 SURGICAL SUPPLY — 20 items
BAG COUNTER SPONGE SURGICOUNT (BAG) IMPLANT
BAG URINE DRAIN 2000ML AR STRL (UROLOGICAL SUPPLIES) IMPLANT
BAG URO CATCHER STRL LF (MISCELLANEOUS) ×2 IMPLANT
CATH URETL OPEN END 6FR 70 (CATHETERS) IMPLANT
CLOTH BEACON ORANGE TIMEOUT ST (SAFETY) ×2 IMPLANT
DRAPE FOOT SWITCH (DRAPES) ×2 IMPLANT
ELECT REM PT RETURN 15FT ADLT (MISCELLANEOUS) ×2 IMPLANT
EVACUATOR MICROVAS BLADDER (UROLOGICAL SUPPLIES) IMPLANT
GLOVE SURG LX STRL 7.5 STRW (GLOVE) ×2 IMPLANT
GOWN STRL REUS W/ TWL XL LVL3 (GOWN DISPOSABLE) ×2 IMPLANT
GUIDEWIRE STR DUAL SENSOR (WIRE) ×2 IMPLANT
KIT TURNOVER KIT A (KITS) IMPLANT
LOOP CUT BIPOLAR 24F LRG (ELECTROSURGICAL) IMPLANT
MANIFOLD NEPTUNE II (INSTRUMENTS) ×2 IMPLANT
NS IRRIG 1000ML POUR BTL (IV SOLUTION) IMPLANT
PACK CYSTO (CUSTOM PROCEDURE TRAY) ×2 IMPLANT
SYR TOOMEY IRRIG 70ML (MISCELLANEOUS)
SYRINGE TOOMEY IRRIG 70ML (MISCELLANEOUS) IMPLANT
TUBING CONNECTING 10 (TUBING) ×2 IMPLANT
TUBING UROLOGY SET (TUBING) ×2 IMPLANT

## 2023-04-27 NOTE — Anesthesia Postprocedure Evaluation (Signed)
Anesthesia Post Note  Patient: BAYLIN SUBA  Procedure(s) Performed: CYSTOSCOPY WITH RIGHT RETROGRADE PYELOGRAM (Right: Ureter) TRANSURETHRAL RESECTION OF BLADDER TUMOR (TURBT) (Bladder)     Patient location during evaluation: PACU Anesthesia Type: General Level of consciousness: awake and alert Pain management: pain level controlled Vital Signs Assessment: post-procedure vital signs reviewed and stable Respiratory status: spontaneous breathing, nonlabored ventilation, respiratory function stable and patient connected to nasal cannula oxygen Cardiovascular status: blood pressure returned to baseline and stable Postop Assessment: no apparent nausea or vomiting Anesthetic complications: no   No notable events documented.  Last Vitals:  Vitals:   04/27/23 1326 04/27/23 1607  BP: (!) 122/52   Pulse: (!) 53   Resp: 15   Temp: 36.7 C (!) (P) 36.3 C  SpO2: 95%     Last Pain:  Vitals:   04/27/23 1326  TempSrc: Oral  PainSc:                  Trevor Iha

## 2023-04-27 NOTE — Anesthesia Preprocedure Evaluation (Signed)
Anesthesia Evaluation  Patient identified by MRN, date of birth, ID band Patient awake    Reviewed: Allergy & Precautions, NPO status , Patient's Chart, lab work & pertinent test results  Airway Mallampati: I  TM Distance: >3 FB Neck ROM: Full    Dental no notable dental hx. (+) Upper Dentures, Lower Dentures   Pulmonary shortness of breath, COPD,  COPD inhaler, Current SmokerPatient did not abstain from smoking.   Pulmonary exam normal breath sounds clear to auscultation       Cardiovascular hypertension, Pt. on medications + CAD  Normal cardiovascular exam Rhythm:Regular Rate:Normal     Neuro/Psych  PSYCHIATRIC DISORDERS Anxiety      Neuromuscular disease    GI/Hepatic   Endo/Other  Hypothyroidism    Renal/GU Renal InsufficiencyRenal diseaseLab Results      Component                Value               Date                      NA                       136                 04/25/2023                CL                       101                 04/25/2023                K                        4.5                 04/25/2023                CO2                      29                  04/25/2023                BUN                      22                  04/25/2023                CREATININE               1.70 (H)            04/25/2023                GFRNONAA                 43 (L)              04/25/2023                CALCIUM                  9.2  04/25/2023                GLUCOSE                  93                  04/25/2023                Musculoskeletal   Abdominal   Peds  Hematology Lab Results      Component                Value               Date                      WBC                      8.6                 04/25/2023                HGB                      13.9                04/25/2023                HCT                      41.2                04/25/2023                MCV                       98.1                04/25/2023                PLT                      197                 04/25/2023              Anesthesia Other Findings   Reproductive/Obstetrics                              Anesthesia Physical Anesthesia Plan  ASA: 3  Anesthesia Plan: General   Post-op Pain Management:    Induction: Intravenous  PONV Risk Score and Plan: 2 and Treatment may vary due to age or medical condition, Midazolam and Ondansetron  Airway Management Planned: LMA  Additional Equipment: None  Intra-op Plan:   Post-operative Plan: Extubation in OR  Informed Consent: I have reviewed the patients History and Physical, chart, labs and discussed the procedure including the risks, benefits and alternatives for the proposed anesthesia with the patient or authorized representative who has indicated his/her understanding and acceptance.     Dental advisory given  Plan Discussed with:   Anesthesia Plan Comments:          Anesthesia Quick Evaluation

## 2023-04-27 NOTE — H&P (Signed)
Harry Young is an 67 y.o. male.    Chief Complaint:   HPI:   1 - Bladder Cancer - T1G3 in 2011 s/p BCG induction x 3. Most recent recurrence 2015. Now on yearly cysto cylce   Recent surveilance:  01/2022 - CT - left renal pelvis cancer, cysto with no bladder lesions  03/2023- cysto - multifocal papillary bladder tumors abtou 2cm x3-4 (bladder neck, dome, lateral)   2 - Left Renal Pelvis Cancer - s/p LEFT nephro-ureterectomy 04/2022 for large volume TaG1 tumor not amenable to endocopic management. Margins negative.   3 - Prostate Screening -  2020 - PSA 1.3   4 - Lower Urinary Tract Symptoms - progressive btoher from mostly irritative symptoms. May want to consider therapy after renal pelvis tumor treatment.   5 - Solitary Right Kidney - s/p left nephrectomy 04/2022.   PMH sig for chronic back pain / narcotics, COPD (not limitig). His wife Andrey Campanile is very involved. Retired from [employer]. / His PCP is Linus Galas NP with [facility].   Today " Harry Young " is seen  to proceed with TURBT ./ Rt retrograde for recurrent bladder cancer. NO interval fevers. Cr 1.4, most recent UCX negative.   Past Medical History:  Diagnosis Date   Anxiety disorder, unspecified    Arthritis    Asthma    as a child   Asymmetrical hearing loss of left ear 01/03/2018   Bilateral carotid artery stenosis 08/07/2015   Dr. Nanetta Batty- 04/01/15- "Heterogenous plaque- 1-39% RICA stenosis.  Homogenous plaque- 40-59% LICA stenosis.  >50% LECA stenosis.  Normal subclavian arteries bilaterally.  Patent vertebral arteries with antegrade flow.   Complication of anesthesia    slow to wake up   Constipation 08/07/2015   COPD (chronic obstructive pulmonary disease) (HCC)    Coronary artery disease involving native coronary artery of native heart without angina pectoris 04/03/2019   Displacement of lumbar intervertebral disc without myelopathy 04/14/2013   Dyspnea    Essential (primary) hypertension 04/03/2019    Golfer's elbow, right 03/30/2016   Heart murmur    History of kidney removal    left kidney . due to cancer   History of kidney stones    Hyperlipidemia 08/07/2015   Hypothyroidism due to Hashimoto's thyroiditis 07/15/2015   Leg weakness 08/07/2015   Lumbar radiculopathy 10/21/2013   Nocturia    Nonrheumatic aortic valve insufficiency 02/22/2019   Nonrheumatic aortic valve stenosis 02/22/2019   Spinal stenosis of lumbar region 05/19/2015   Transitional cell carcinoma of bladder (HCC) UROLOGIST--  DR Vernie Ammons   S/P TURBT X3--  HX BCG TX'S   Vitamin D deficiency     Past Surgical History:  Procedure Laterality Date   BRONCHIAL BIOPSY  01/09/2022   Procedure: BRONCHIAL BIOPSIES;  Surgeon: Omar Person, MD;  Location: [facility] ENDOSCOPY;  Service: Pulmonary;;   BRONCHIAL BRUSHINGS  01/09/2022   Procedure: BRONCHIAL BRUSHINGS;  Surgeon: Omar Person, MD;  Location: [facility] ENDOSCOPY;  Service: Pulmonary;;   BRONCHIAL NEEDLE ASPIRATION BIOPSY  01/09/2022   Procedure: BRONCHIAL NEEDLE ASPIRATION BIOPSIES;  Surgeon: Omar Person, MD;  Location: [facility] ENDOSCOPY;  Service: Pulmonary;;   CYSTOSCOPY  05/10/2022   Procedure: CYSTOSCOPY WITH STENT REMOVAL;  Surgeon: Sebastian Ache, MD;  Location: [facility] ORS;  Service: Urology;;   Bluford Kaufmann W/ RETROGRADES Bilateral 12/13/2012   Procedure: CYSTOSCOPY WITH RETROGRADE PYELOGRAM;  Surgeon: Garnett Farm, MD;  Location: [facility];  Service: Urology;  Laterality: Bilateral;   CYSTOSCOPY WITH  LITHOLAPAXY N/A 04/04/2013   Procedure: CYSTOSCOPY ;  Surgeon: Garnett Farm, MD;  Location: [facility];  Service: Urology;  Laterality: N/A;   CYSTOSCOPY WITH RETROGRADE PYELOGRAM, URETEROSCOPY AND STENT PLACEMENT Bilateral 02/28/2022   Procedure: CYSTOSCOPY WITH RETROGRADE PYELOGRAM, URETEROSCOPY, LEFT STENT PLACEMENT;  Surgeon: Belva Agee, MD;  Location: [facility]S;  Service: Urology;  Laterality: Bilateral;  1 HR   LUMBAR  DISC SURGERY  09/20/2005   L2 -- L5   LUMBAR LAMINECTOMY/DECOMPRESSION MICRODISCECTOMY Left 01/09/2013   Procedure: Left  lumbar four-five microdiskectomy;  Surgeon: Carmela Hurt, MD;  Location: [facility]URO ORS;  Service: Neurosurgery;  Laterality: Left;   LUMBAR WOUND DEBRIDEMENT N/A 01/13/2013   Procedure: LUMBAR WOUND DEBRIDEMENT;  Surgeon: Temple Pacini, MD;  Location: [facility]UR[facility] OR[facility];  Service: Neurosurgery;  Laterality: N/A;  Lumbar Wound Exploration,  Attempted Placement of Cerebrospinal Drain, and Repair of CSF Leak   ROBOT ASSITED LAPAROSCOPIC NEPHROURETERECTOMY Left 05/10/2022   Procedure: XI ROBOT ASSITED LAPAROSCOPIC NEPHROURETERECTOMY;  Surgeon: Sebastian Ache, MD;  Location: [facility];  Service: Urology;  Laterality: Left;   TOOTH EXTRACTION     TRANSURETHRAL RESECTION OF BLADDER TUMOR  07-05-2009;   11-22-2009   bladder cancer   TRANSURETHRAL RESECTION OF BLADDER TUMOR N/A 12/13/2012   Procedure: TRANSURETHRAL RESECTION OF BLADDER TUMOR (TURBT) AND INSTILLATION OF MYTOMYCIN;  Surgeon: Garnett Farm, MD;  Location: [facility];  Service: Urology;  Laterality: N/A;   TRANSURETHRAL RESECTION OF BLADDER TUMOR WITH GYRUS (TURBT-GYRUS) N/A 04/04/2013   Procedure: TRANSURETHRAL RESECTION OF BLADDER TUMOR WITH GYRUS (TURBT-GYRUS);  Surgeon: Garnett Farm, MD;  Location: [facility];  Service: Urology;  Laterality: N/A;    Family History  Problem Relation Age of Onset   Hyperlipidemia Mother    Heart disease Father    Heart failure Father    COPD Father    Diabetes Brother    Social History:  reports that he has been smoking cigarettes. He started smoking about 54 years ago. He has a 54.8 pack-year smoking history. He has never used smokeless tobacco. He reports that he does not drink alcohol and does not use drugs.  Allergies:  Allergies  Allergen Reactions   Crestor [Rosuvastatin] Other (See Comments)    Urinary retention.   Sulfa Antibiotics Rash     No medications prior to admission.    Results for orders placed or performed during the hospital encounter of 04/25/23 (from the past 48 hours)  Basic metabolic panel per protocol     Status: Abnormal   Collection Time: 04/25/23 10:47 AM  Result Value Ref Range   Sodium 136 135 - 145 mmol/L   Potassium 4.5 3.5 - 5.1 mmol/L   Chloride 101 98 - 111 mmol/L   CO2 29 22 - 32 mmol/L   Glucose, Bld 93 70 - 99 mg/dL    Comment: Glucose reference range applies only to samples taken after fasting for at least 8 hours.   BUN 22 8 - 23 mg/dL   Creatinine, Ser 1.32 (H) 0.61 - 1.24 mg/dL   Calcium 9.2 8.9 - 44.0 mg/dL   GFR, Estimated 43 (L) >60 mL/min    Comment: (NOTE) Calculated using the CKD-EPI Creatinine Equation (2021)    Anion gap 6 5 - 15    Comment: Performed at [facility], [address] [place], [place] [address]  CBC per protocol     Status: Abnormal   Collection Time: 04/25/23 10:47  AM  Result Value Ref Range   WBC 8.6 4.0 - 10.5 K/uL   RBC 4.20 (L) 4.22 - 5.81 MIL/uL   Hemoglobin 13.9 13.0 - 17.0 g/dL   HCT 11.9 14.7 - 82.9 %   MCV 98.1 80.0 - 100.0 fL   MCH 33.1 26.0 - 34.0 pg   MCHC 33.7 30.0 - 36.0 g/dL   RDW 56.2 13.0 - 86.5 %   Platelets 197 150 - 400 K/uL   nRBC 0.0 0.0 - 0.2 %    Comment: Performed at [facility], [address], [place], [place] [address]   No results found.  Review of Systems  Genitourinary:  Positive for hematuria.  All other systems reviewed and are negative.   There were no vitals taken for this visit. Physical Exam Vitals reviewed.  HENT:     Head: Normocephalic.  Eyes:     Pupils: Pupils are equal, round, and reactive to light.  Pulmonary:     Effort: Pulmonary effort is normal.  Abdominal:     Comments: Prior scars w/o hernias.   Genitourinary:    Penis: Normal.   Musculoskeletal:        General: Normal range of motion.     Cervical back: Normal range of motion.  Skin:     General: Skin is warm.  Neurological:     General: No focal deficit present.     Mental Status: He is alert.  Psychiatric:        Mood and Affect: Mood normal.      Assessment/Plan  Proceed as planned with TURBT / Rt retrograde. Risks, benefits, alternatives, expectd peri-op course discussed previously and reiterated today.   Loletta Parish., MD 04/27/2023, 7:39 AM

## 2023-04-27 NOTE — Anesthesia Procedure Notes (Signed)
Procedure Name: LMA Insertion Date/Time: 04/27/2023 3:28 PM  Performed by: Doran Clay, CRNAPre-anesthesia Checklist: Patient identified, Suction available, Emergency Drugs available, Patient being monitored and Timeout performed Patient Re-evaluated:Patient Re-evaluated prior to induction Oxygen Delivery Method: Circle system utilized Preoxygenation: Pre-oxygenation with 100% oxygen Induction Type: IV induction LMA: LMA inserted LMA Size: 4.0 Tube type: Oral Number of attempts: 1 Placement Confirmation: positive ETCO2 and breath sounds checked- equal and bilateral Tube secured with: Tape Dental Injury: Teeth and Oropharynx as per pre-operative assessment

## 2023-04-27 NOTE — Brief Op Note (Signed)
04/27/2023  4:00 PM  PATIENT:  Patrica Duel  67 y.o. male  PRE-OPERATIVE DIAGNOSIS:  RECURRENT BLADDER CANCER  POST-OPERATIVE DIAGNOSIS:  RECURRENT BLADDER CANCER  PROCEDURE:  Procedure(s) with comments: CYSTOSCOPY WITH RIGHT RETROGRADE PYELOGRAM (Right) - 60 MINUTES NEEDED FOR CASE TRANSURETHRAL RESECTION OF BLADDER TUMOR (TURBT) (N/A)  SURGEON:  Surgeons and Role:    * Christy Ehrsam, Delbert Phenix., MD - Primary  PHYSICIAN ASSISTANT:   ASSISTANTS: none   ANESTHESIA:   general  EBL:  minimal   BLOOD ADMINISTERED:none  DRAINS: none   LOCAL MEDICATIONS USED:  NONE  SPECIMEN:  Source of Specimen:  1 - bladder tumor; 2 - base of bladder tumor  DISPOSITION OF SPECIMEN:  PATHOLOGY  COUNTS:  YES  TOURNIQUET:  * No tourniquets in log *  DICTATION: .Other Dictation: Dictation Number [id number]  PLAN OF CARE: Discharge to home after PACU  PATIENT DISPOSITION:  PACU - hemodynamically stable.   Delay start of Pharmacological VTE agent (>24hrs) due to surgical blood loss or risk of bleeding: not applicable

## 2023-04-27 NOTE — Transfer of Care (Signed)
Immediate Anesthesia Transfer of Care Note  Patient: Harry Young  Procedure(s) Performed: CYSTOSCOPY WITH RIGHT RETROGRADE PYELOGRAM (Right: Ureter) TRANSURETHRAL RESECTION OF BLADDER TUMOR (TURBT) (Bladder)  Patient Location: PACU  Anesthesia Type:General  Level of Consciousness: sedated  Airway & Oxygen Therapy: Patient Spontanous Breathing and Patient connected to face mask oxygen  Post-op Assessment: Report given to RN and Post -op Vital signs reviewed and stable  Post vital signs: Reviewed and stable  Last Vitals:  Vitals Value Taken Time  BP 115/56 04/27/23 1607  Temp    Pulse 57 04/27/23 1608  Resp 10 04/27/23 1608  SpO2 100 % 04/27/23 1608  Vitals shown include unfiled device data.  Last Pain:  Vitals:   04/27/23 1326  TempSrc: Oral  PainSc:          Complications: No notable events documented.

## 2023-04-27 NOTE — Discharge Instructions (Signed)
1 - You may have urinary urgency (bladder spasms) and bloody urine on / off for up to 2 weeks.  This is normal.  2 - Call MD or go to ER for fever >102, severe pain / nausea / vomiting not relieved by medications, or acute change in medical status  

## 2023-04-27 NOTE — Op Note (Unsigned)
NAME: Harry Young, Harry Young. MEDICAL RECORD NO: [id number] ACCOUNT NO: [id number] DATE OF BIRTH: 1956-03-25 FACILITY: [facility] LOCATION: [facility] PHYSICIAN: Sebastian Ache, MD  Operative Report   DATE OF PROCEDURE: 04/27/2023  PREOPERATIVE DIAGNOSIS:  Recurrent bladder cancer.  PROCEDURE PERFORMED: 1. Transurethral resection of bladder tumor, volume medium. 2. Right retrograde pyelogram, interpretation.  ESTIMATED BLOOD LOSS:  Nil.  COMPLICATIONS:  None.  SPECIMENS: 1.  Bladder tumor. 2.  Base of bladder tumor for permanent pathology.  FINDINGS: 1.  Multifocal papillary bladder tumor mostly in the posterior wall, intertrigone, and bladder neck area.  Total surface area approximately 4.5 cm2. 2.  Unremarkable right retrograde pyelogram.  INDICATIONS:  The patient is a pleasant 67 year old man with a very long history of recurrent multifocal urothelial carcinoma.  He is status post TURBT, induction BCG, and even left nephroureterectomy previously.  He was found on surveillance to have  recurrence of multifocal bladder tumor.  Options were discussed including recommended path of transurethral resection for diagnostic and therapeutic intent along with right retrograde.  He presents for this today, informed consent was obtained and placed  in medical record.  DESCRIPTION OF PROCEDURE:  The patient being identified and was verified.  Procedure being transurethral resection of bladder tumor and right retrograde was confirmed.  Procedure timeout was performed.  Intravenous antibiotics administered.  General LMA  anesthesia was induced.  The patient was placed into a low lithotomy position.  Sterile field was created, prepped, and draped the patient's penis, perineum, and proximal thighs using iodine.  Cystourethroscopy was performed using 21-French rigid  cystoscope with offset lens.  Inspection of anterior and posterior urethra was unremarkable.  Inspection of the urinary bladder revealed multifocal  bladder tumors, total surface area approximately 4.5 cm squared.  This was spread across three foci, one  posterior wall, one intertrigone, and one towards the bladder neck.  The left ureteral orifice was surgically absent.  The right ureteral orifice was cannulated with a 6-French end-hole catheter and a right retrograde pyelogram was obtained.  Right retrograde pyelogram demonstrated single right ureter, single system right kidney.  No filling defects or narrowing noted.  The cystoscope was then exchanged with a 26-French resectoscope sheath of the visual obturator.  Using medium resectoscope  loop, very careful resection was performed of each foci of tumor down to the very superficial fibromuscular stroma of the urinary bladder.  These bladder tumor fragments were irrigated and set aside for pathology and labeled as bladder tumor.  Next, cold  cup biopsy forceps were used to obtain representative seromuscular deep bites from the area of posterior wall tumor, which was the dominant in the area of the bladder neck.  These small tissue fragments were set aside labeled as base of the bladder  tumor.  All resection areas were fulgurated with coagulation current, carefully inspected.  No evidence of any perforation.  Hemostasis was excellent.  It was not felt that catheterization would be warranted.  The bladder was partially empty per  cystoscope.  Procedure was then terminated.  The patient tolerated the procedure well.  No immediate periprocedural complications.  The patient was taken to the postanesthesia care unit in stable condition.  Plan for discharge home.   PUS Young: 04/27/2023 4:04:13 pm T: 04/27/2023 6:31:00 pm  JOB: [id number]/ [id number]

## 2023-04-28 ENCOUNTER — Encounter (HOSPITAL_COMMUNITY): Payer: Self-pay | Admitting: Urology

## 2023-05-01 LAB — SURGICAL PATHOLOGY

## 2023-11-21 ENCOUNTER — Other Ambulatory Visit: Payer: Self-pay | Admitting: Urology

## 2023-11-21 NOTE — Patient Instructions (Signed)
 SURGICAL WAITING ROOM VISITATION Patients having surgery or a procedure may have no more than 2 support people in the waiting area - these visitors may rotate in the visitor waiting room.   Due to an increase in RSV and influenza rates and associated hospitalizations, children ages 8 and under may not visit patients in [facility] hospitals. If the patient needs to stay at the hospital during part of their recovery, the visitor guidelines for inpatient rooms apply.  PRE-OP VISITATION  Pre-op nurse will coordinate an appropriate time for 1 support person to accompany the patient in pre-op.  This support person may not rotate.  This visitor will be contacted when the time is appropriate for the visitor to come back in the pre-op area.  Please refer to the [facility] website for the visitor guidelines for Inpatients (after your surgery is over and you are in a regular room).  You are not required to quarantine at this time prior to your surgery. However, you must do this: Hand Hygiene often Do NOT share personal items Notify your provider if you are in close contact with someone who has COVID or you develop fever 100.4 or greater, new onset of sneezing, cough, sore throat, shortness of breath or body aches.  If you test positive for Covid or have been in contact with anyone that has tested positive in the last 10 days please notify you surgeon.    Your procedure is scheduled on:  11/23/23  Report to [facility] Main Entrance: [place] entrance where the [employer] is available.   Report to admitting at:12:45 PM  Call this number if you have any questions or problems the morning of surgery [phone]  FOLLOW ANY ADDITIONAL PRE OP INSTRUCTIONS YOU RECEIVED FROM YOUR SURGEON'S OFFICE!!!  Do not eat food after Midnight the night prior to your surgery/procedure.  After Midnight you may have the following liquids until : 12:00 PM DAY OF SURGERY  Clear Liquid Diet Water  Black  Coffee (sugar ok, NO MILK/CREAM OR CREAMERS)  Tea (sugar ok, NO MILK/CREAM OR CREAMERS) regular and decaf                             Plain Jell-O  with no fruit (NO RED)                                           Fruit ices (not with fruit pulp, NO RED)                                     Popsicles (NO RED)                                                                  Juice: NO CITRUS JUICES: only apple, WHITE grape, WHITE cranberry Sports drinks like Gatorade or Powerade (NO RED)   Oral Hygiene is also important to reduce your risk of infection.        Remember - BRUSH YOUR TEETH THE MORNING OF SURGERY WITH YOUR REGULAR TOOTHPASTE  Do NOT smoke after Midnight the night before surgery.  STOP TAKING all Vitamins, Herbs and supplements 1 week before your surgery.   Take ONLY these medicines the morning of surgery with A SIP OF WATER : amlodipine ,levothyroxine .Use inhalers as usual and bring them.                   You may not have any metal on your body including hair pins, jewelry, and body piercing  Do not wear lotions, powders, perfumes / cologne, or deodorant.   Men may shave face and neck.  Contacts, Hearing Aids, dentures or bridgework may not be worn into surgery. DENTURES WILL BE REMOVED PRIOR TO SURGERY PLEASE DO NOT APPLY Poly grip OR ADHESIVES!!!  You may bring a small overnight bag with you on the day of surgery, only pack items that are not valuable. [facility] IS NOT RESPONSIBLE   FOR VALUABLES THAT ARE LOST OR STOLEN.   Patients discharged on the day of surgery will not be allowed to drive home.  Someone NEEDS to stay with you for the first 24 hours after anesthesia.  Do not bring your home medications to the hospital. The Pharmacy will dispense medications listed on your medication list to you during your admission in the Hospital.  Special Instructions: Bring a copy of your healthcare power of attorney and living will documents the day of surgery, if you wish to  have them scanned into your [facility] Records- EPIC  Please read over the following fact sheets you were given: IF YOU HAVE QUESTIONS ABOUT YOUR PRE-OP INSTRUCTIONS, PLEASE CALL [phone]   [facility] - Preparing for Surgery Before surgery, you can play an important role.  Because skin is not sterile, your skin needs to be as free of germs as possible.  You can reduce the number of germs on your skin by washing with CHG (chlorahexidine gluconate) soap before surgery.  CHG is an antiseptic cleaner which kills germs and bonds with the skin to continue killing germs even after washing. Please DO NOT use if you have an allergy to CHG or antibacterial soaps.  If your skin becomes reddened/irritated stop using the CHG and inform your nurse when you arrive at Short Stay. Do not shave (including legs and underarms) for at least 48 hours prior to the first CHG shower.  You may shave your face/neck.  Please follow these instructions carefully:  1.  Shower with CHG Soap the night before surgery and the  morning of surgery.  2.  If you choose to wash your hair, wash your hair first as usual with your normal  shampoo.  3.  After you shampoo, rinse your hair and body thoroughly to remove the shampoo.                             4.  Use CHG as you would any other liquid soap.  You can apply chg directly to the skin and wash.  Gently with a scrungie or clean washcloth.  5.  Apply the CHG Soap to your body ONLY FROM THE NECK DOWN.   Do not use on face/ open                           Wound or open sores. Avoid contact with eyes, ears mouth and genitals (private parts).  Wash face,  Genitals (private parts) with your normal soap.             6.  Wash thoroughly, paying special attention to the area where your  surgery  will be performed.  7.  Thoroughly rinse your body with warm water  from the neck down.  8.  DO NOT shower/wash with your normal soap after using and rinsing off the  CHG Soap.            9.  Pat yourself dry with a clean towel.            10.  Wear clean pajamas.            11.  Place clean sheets on your bed the night of your first shower and do not  sleep with pets.  ON THE DAY OF SURGERY : Do not apply any lotions/deodorants the morning of surgery.  Please wear clean clothes to the hospital/surgery center.     FAILURE TO FOLLOW THESE INSTRUCTIONS MAY RESULT IN THE CANCELLATION OF YOUR SURGERY  PATIENT SIGNATURE_________________________________  NURSE SIGNATURE__________________________________  ________________________________________________________________________

## 2023-11-21 NOTE — Progress Notes (Signed)
Pt. Needs orders for surgery. 

## 2023-11-22 ENCOUNTER — Inpatient Hospital Stay (HOSPITAL_COMMUNITY)

## 2023-11-22 ENCOUNTER — Encounter (HOSPITAL_COMMUNITY): Payer: Self-pay

## 2023-11-22 ENCOUNTER — Emergency Department (HOSPITAL_COMMUNITY)

## 2023-11-22 ENCOUNTER — Encounter (HOSPITAL_COMMUNITY)
Admission: RE | Admit: 2023-11-22 | Discharge: 2023-11-22 | Disposition: A | Discharge status: Home / Self Care | Source: Ambulatory Visit | Attending: Urology | Admitting: Urology

## 2023-11-22 ENCOUNTER — Encounter (HOSPITAL_COMMUNITY): Payer: Self-pay | Admitting: Medical

## 2023-11-22 ENCOUNTER — Other Ambulatory Visit: Payer: Self-pay

## 2023-11-22 ENCOUNTER — Inpatient Hospital Stay (HOSPITAL_COMMUNITY)
Admission: EM | Admit: 2023-11-22 | Discharge: 2023-11-28 | DRG: 871 | Disposition: A | Discharge status: Home Health | Source: Ambulatory Visit | Attending: Internal Medicine | Admitting: Internal Medicine

## 2023-11-22 VITALS — BP 137/66 | HR 114 | Temp 97.6°F | Ht 69.0 in

## 2023-11-22 DIAGNOSIS — C7800 Secondary malignant neoplasm of unspecified lung: Secondary | ICD-10-CM | POA: Diagnosis present

## 2023-11-22 DIAGNOSIS — I129 Hypertensive chronic kidney disease with stage 1 through stage 4 chronic kidney disease, or unspecified chronic kidney disease: Secondary | ICD-10-CM | POA: Diagnosis present

## 2023-11-22 DIAGNOSIS — Z882 Allergy status to sulfonamides status: Secondary | ICD-10-CM

## 2023-11-22 DIAGNOSIS — I2602 Saddle embolus of pulmonary artery with acute cor pulmonale: Secondary | ICD-10-CM

## 2023-11-22 DIAGNOSIS — F1123 Opioid dependence with withdrawal: Secondary | ICD-10-CM | POA: Diagnosis present

## 2023-11-22 DIAGNOSIS — J9601 Acute respiratory failure with hypoxia: Secondary | ICD-10-CM | POA: Diagnosis present

## 2023-11-22 DIAGNOSIS — C679 Malignant neoplasm of bladder, unspecified: Secondary | ICD-10-CM | POA: Diagnosis present

## 2023-11-22 DIAGNOSIS — Z8249 Family history of ischemic heart disease and other diseases of the circulatory system: Secondary | ICD-10-CM

## 2023-11-22 DIAGNOSIS — Z833 Family history of diabetes mellitus: Secondary | ICD-10-CM

## 2023-11-22 DIAGNOSIS — N1831 Chronic kidney disease, stage 3a: Secondary | ICD-10-CM | POA: Diagnosis present

## 2023-11-22 DIAGNOSIS — A419 Sepsis, unspecified organism: Secondary | ICD-10-CM | POA: Diagnosis not present

## 2023-11-22 DIAGNOSIS — Z66 Do not resuscitate: Secondary | ICD-10-CM | POA: Diagnosis present

## 2023-11-22 DIAGNOSIS — E785 Hyperlipidemia, unspecified: Secondary | ICD-10-CM | POA: Diagnosis present

## 2023-11-22 DIAGNOSIS — R1310 Dysphagia, unspecified: Secondary | ICD-10-CM | POA: Diagnosis present

## 2023-11-22 DIAGNOSIS — R6521 Severe sepsis with septic shock: Secondary | ICD-10-CM | POA: Diagnosis present

## 2023-11-22 DIAGNOSIS — Z681 Body mass index (BMI) 19 or less, adult: Secondary | ICD-10-CM | POA: Diagnosis not present

## 2023-11-22 DIAGNOSIS — Z825 Family history of asthma and other chronic lower respiratory diseases: Secondary | ICD-10-CM

## 2023-11-22 DIAGNOSIS — J154 Pneumonia due to other streptococci: Secondary | ICD-10-CM | POA: Diagnosis present

## 2023-11-22 DIAGNOSIS — Z87442 Personal history of urinary calculi: Secondary | ICD-10-CM

## 2023-11-22 DIAGNOSIS — J439 Emphysema, unspecified: Secondary | ICD-10-CM | POA: Diagnosis present

## 2023-11-22 DIAGNOSIS — E063 Autoimmune thyroiditis: Secondary | ICD-10-CM | POA: Diagnosis present

## 2023-11-22 DIAGNOSIS — J440 Chronic obstructive pulmonary disease with acute lower respiratory infection: Secondary | ICD-10-CM | POA: Diagnosis present

## 2023-11-22 DIAGNOSIS — Z8709 Personal history of other diseases of the respiratory system: Secondary | ICD-10-CM

## 2023-11-22 DIAGNOSIS — N179 Acute kidney failure, unspecified: Secondary | ICD-10-CM | POA: Diagnosis present

## 2023-11-22 DIAGNOSIS — E039 Hypothyroidism, unspecified: Secondary | ICD-10-CM | POA: Diagnosis present

## 2023-11-22 DIAGNOSIS — E876 Hypokalemia: Secondary | ICD-10-CM | POA: Diagnosis present

## 2023-11-22 DIAGNOSIS — I10 Essential (primary) hypertension: Secondary | ICD-10-CM | POA: Diagnosis present

## 2023-11-22 DIAGNOSIS — R64 Cachexia: Secondary | ICD-10-CM | POA: Diagnosis present

## 2023-11-22 DIAGNOSIS — J9690 Respiratory failure, unspecified, unspecified whether with hypoxia or hypercapnia: Secondary | ICD-10-CM | POA: Diagnosis present

## 2023-11-22 DIAGNOSIS — G9341 Metabolic encephalopathy: Secondary | ICD-10-CM | POA: Diagnosis present

## 2023-11-22 DIAGNOSIS — R918 Other nonspecific abnormal finding of lung field: Secondary | ICD-10-CM | POA: Diagnosis not present

## 2023-11-22 DIAGNOSIS — E43 Unspecified severe protein-calorie malnutrition: Secondary | ICD-10-CM | POA: Diagnosis present

## 2023-11-22 DIAGNOSIS — E782 Mixed hyperlipidemia: Secondary | ICD-10-CM | POA: Diagnosis present

## 2023-11-22 DIAGNOSIS — R739 Hyperglycemia, unspecified: Secondary | ICD-10-CM | POA: Diagnosis present

## 2023-11-22 DIAGNOSIS — J441 Chronic obstructive pulmonary disease with (acute) exacerbation: Secondary | ICD-10-CM | POA: Diagnosis present

## 2023-11-22 DIAGNOSIS — G2581 Restless legs syndrome: Secondary | ICD-10-CM | POA: Diagnosis not present

## 2023-11-22 DIAGNOSIS — F17210 Nicotine dependence, cigarettes, uncomplicated: Secondary | ICD-10-CM | POA: Diagnosis present

## 2023-11-22 DIAGNOSIS — I2510 Atherosclerotic heart disease of native coronary artery without angina pectoris: Secondary | ICD-10-CM | POA: Diagnosis present

## 2023-11-22 DIAGNOSIS — Z539 Procedure and treatment not carried out, unspecified reason: Secondary | ICD-10-CM | POA: Diagnosis present

## 2023-11-22 DIAGNOSIS — T380X5A Adverse effect of glucocorticoids and synthetic analogues, initial encounter: Secondary | ICD-10-CM | POA: Diagnosis present

## 2023-11-22 DIAGNOSIS — Z79890 Hormone replacement therapy (postmenopausal): Secondary | ICD-10-CM

## 2023-11-22 DIAGNOSIS — R6510 Systemic inflammatory response syndrome (SIRS) of non-infectious origin without acute organ dysfunction: Secondary | ICD-10-CM | POA: Diagnosis not present

## 2023-11-22 DIAGNOSIS — Z905 Acquired absence of kidney: Secondary | ICD-10-CM

## 2023-11-22 DIAGNOSIS — J9602 Acute respiratory failure with hypercapnia: Secondary | ICD-10-CM | POA: Diagnosis present

## 2023-11-22 DIAGNOSIS — H9192 Unspecified hearing loss, left ear: Secondary | ICD-10-CM | POA: Diagnosis present

## 2023-11-22 DIAGNOSIS — Z83438 Family history of other disorder of lipoprotein metabolism and other lipidemia: Secondary | ICD-10-CM

## 2023-11-22 LAB — CBC
HCT: 47.4 % (ref 39.0–52.0)
Hemoglobin: 15.4 g/dL (ref 13.0–17.0)
MCH: 32 pg (ref 26.0–34.0)
MCHC: 32.5 g/dL (ref 30.0–36.0)
MCV: 98.3 fL (ref 80.0–100.0)
Platelets: 221 K/uL (ref 150–400)
RBC: 4.82 MIL/uL (ref 4.22–5.81)
RDW: 14.1 % (ref 11.5–15.5)
WBC: 22.3 K/uL — ABNORMAL HIGH (ref 4.0–10.5)
nRBC: 0 % (ref 0.0–0.2)

## 2023-11-22 LAB — ECHOCARDIOGRAM COMPLETE
AR max vel: 0.92 cm2
AV Area VTI: 1.1 cm2
AV Area mean vel: 0.85 cm2
AV Mean grad: 23 mmHg
AV Peak grad: 35.5 mmHg
Ao pk vel: 2.98 m/s
Area-P 1/2: 3.02 cm2
Height: 69 in
S' Lateral: 3.1 cm
Weight: 1920 [oz_av]

## 2023-11-22 LAB — COMPREHENSIVE METABOLIC PANEL WITH GFR
ALT: 19 U/L (ref 0–44)
AST: 22 U/L (ref 15–41)
Albumin: 4.7 g/dL (ref 3.5–5.0)
Alkaline Phosphatase: 108 U/L (ref 38–126)
Anion gap: 12 (ref 5–15)
BUN: 18 mg/dL (ref 8–23)
CO2: 26 mmol/L (ref 22–32)
Calcium: 9.6 mg/dL (ref 8.9–10.3)
Chloride: 101 mmol/L (ref 98–111)
Creatinine, Ser: 1.32 mg/dL — ABNORMAL HIGH (ref 0.61–1.24)
GFR, Estimated: 58 mL/min — ABNORMAL LOW (ref >60)
Glucose, Bld: 152 mg/dL — ABNORMAL HIGH (ref 70–99)
Potassium: 4.3 mmol/L (ref 3.5–5.1)
Sodium: 139 mmol/L (ref 135–145)
Total Bilirubin: 1.9 mg/dL — ABNORMAL HIGH (ref 0.0–1.2)
Total Protein: 8.3 g/dL — ABNORMAL HIGH (ref 6.5–8.1)

## 2023-11-22 LAB — BLOOD GAS, VENOUS
Acid-base deficit: 2.9 mmol/L — ABNORMAL HIGH (ref 0.0–2.0)
Bicarbonate: 28.6 mmol/L — ABNORMAL HIGH (ref 20.0–28.0)
O2 Saturation: 77.7 %
Patient temperature: 37
pCO2, Ven: 86 mmHg (ref 44–60)
pH, Ven: 7.13 — CL (ref 7.25–7.43)
pO2, Ven: 51 mmHg — ABNORMAL HIGH (ref 32–45)

## 2023-11-22 LAB — BLOOD GAS, ARTERIAL
Acid-base deficit: 3.6 mmol/L — ABNORMAL HIGH (ref 0.0–2.0)
Bicarbonate: 21.5 mmol/L (ref 20.0–28.0)
Drawn by: 732101
FIO2: 100 %
MECHVT: 560 mL
O2 Saturation: 100 %
PEEP: 5 cmH2O
Patient temperature: 36.5
RATE: 20 {breaths}/min
pCO2 arterial: 37 mmHg (ref 32–48)
pH, Arterial: 7.37 (ref 7.35–7.45)
pO2, Arterial: 375 mmHg — ABNORMAL HIGH (ref 83–108)

## 2023-11-22 LAB — LACTIC ACID, PLASMA
Lactic Acid, Venous: 5.5 mmol/L (ref 0.5–1.9)
Lactic Acid, Venous: 4.5 mmol/L (ref 0.5–1.9)

## 2023-11-22 LAB — COOXEMETRY PANEL
Carboxyhemoglobin: 1.5 % (ref 0.5–1.5)
Methemoglobin: 1.3 % (ref 0.0–1.5)
O2 Saturation: 64.7 %
Total hemoglobin: 13.1 g/dL (ref 12.0–16.0)

## 2023-11-22 LAB — PHOSPHORUS: Phosphorus: 3.1 mg/dL (ref 2.5–4.6)

## 2023-11-22 LAB — BRAIN NATRIURETIC PEPTIDE: B Natriuretic Peptide: 107 pg/mL — ABNORMAL HIGH (ref 0.0–100.0)

## 2023-11-22 LAB — TSH: TSH: 0.367 u[IU]/mL (ref 0.350–4.500)

## 2023-11-22 LAB — TROPONIN I (HIGH SENSITIVITY): Troponin I (High Sensitivity): 32 ng/L — ABNORMAL HIGH (ref <18)

## 2023-11-22 LAB — PROCALCITONIN: Procalcitonin: 2.63 ng/mL

## 2023-11-22 LAB — MRSA NEXT GEN BY PCR, NASAL: MRSA by PCR Next Gen: NOT DETECTED

## 2023-11-22 MED ORDER — NOREPINEPHRINE 16 MG/250ML-% IV SOLN
0.0000 ug/min | INTRAVENOUS | Status: DC
  Administered 2023-11-22: 12 ug/min via INTRAVENOUS
  Filled 2023-11-22: qty 250

## 2023-11-22 MED ORDER — ALBUTEROL SULFATE (2.5 MG/3ML) 0.083% IN NEBU
10.0000 mg/h | INHALATION_SOLUTION | Freq: Once | RESPIRATORY_TRACT | Status: AC
  Administered 2023-11-22: 10 mg/h via RESPIRATORY_TRACT
  Filled 2023-11-22: qty 12

## 2023-11-22 MED ORDER — ONDANSETRON HCL 4 MG/2ML IJ SOLN
4.0000 mg | Freq: Four times a day (QID) | INTRAMUSCULAR | Status: DC | PRN
Start: 2023-11-22 — End: 2023-11-28
  Administered 2023-11-27: 4 mg via INTRAVENOUS
  Filled 2023-11-22: qty 2

## 2023-11-22 MED ORDER — ROCURONIUM BROMIDE 10 MG/ML (PF) SYRINGE: 1.0000 mg/kg | PREFILLED_SYRINGE | Freq: Once | INTRAVENOUS | Status: AC

## 2023-11-22 MED ORDER — EPINEPHRINE 1 MG/10ML IJ SOSY
PREFILLED_SYRINGE | INTRAMUSCULAR | Status: AC
  Filled 2023-11-22: qty 10

## 2023-11-22 MED ORDER — DOCUSATE SODIUM 50 MG/5ML PO LIQD
100.0000 mg | Freq: Two times a day (BID) | ORAL | Status: DC
  Administered 2023-11-22 – 2023-11-25 (×6): 100 mg
  Filled 2023-11-22 (×6): qty 10

## 2023-11-22 MED ORDER — METHYLPREDNISOLONE SODIUM SUCC 125 MG IJ SOLR
125.0000 mg | Freq: Once | INTRAMUSCULAR | Status: AC
  Administered 2023-11-22: 125 mg via INTRAVENOUS
  Filled 2023-11-22: qty 2

## 2023-11-22 MED ORDER — SODIUM CHLORIDE 0.9% FLUSH: 10.0000 mL | INTRAVENOUS | Status: DC | PRN

## 2023-11-22 MED ORDER — KETAMINE HCL 50 MG/5ML IJ SOSY
PREFILLED_SYRINGE | INTRAMUSCULAR | Status: AC
  Filled 2023-11-22: qty 10

## 2023-11-22 MED ORDER — POLYETHYLENE GLYCOL 3350 17 G PO PACK: 17.0000 g | PACK | Freq: Every day | ORAL | Status: DC | PRN

## 2023-11-22 MED ORDER — PERFLUTREN LIPID MICROSPHERE
1.0000 mL | INTRAVENOUS | Status: AC | PRN
  Administered 2023-11-22: 3 mL via INTRAVENOUS

## 2023-11-22 MED ORDER — ENOXAPARIN SODIUM 30 MG/0.3ML IJ SOSY: 30.0000 mg | PREFILLED_SYRINGE | INTRAMUSCULAR | Status: DC

## 2023-11-22 MED ORDER — POLYETHYLENE GLYCOL 3350 17 G PO PACK
17.0000 g | PACK | Freq: Every day | ORAL | Status: DC
  Administered 2023-11-22 – 2023-11-25 (×3): 17 g
  Filled 2023-11-22 (×3): qty 1

## 2023-11-22 MED ORDER — LORAZEPAM 2 MG/ML IJ SOLN
0.5000 mg | Freq: Once | INTRAMUSCULAR | Status: AC
  Administered 2023-11-22: 0.5 mg via INTRAVENOUS
  Filled 2023-11-22: qty 1

## 2023-11-22 MED ORDER — EPINEPHRINE 0.1 MG/10ML (10 MCG/ML) SYRINGE FOR IV PUSH (FOR BLOOD PRESSURE SUPPORT): 30.0000 ug | PREFILLED_SYRINGE | Freq: Once | INTRAVENOUS | Status: DC | PRN

## 2023-11-22 MED ORDER — DEXMEDETOMIDINE HCL IN NACL 200 MCG/50ML IV SOLN
INTRAVENOUS | Status: AC
  Filled 2023-11-22: qty 50

## 2023-11-22 MED ORDER — FAMOTIDINE IN NACL 20-0.9 MG/50ML-% IV SOLN
20.0000 mg | Freq: Two times a day (BID) | INTRAVENOUS | Status: DC
Start: 2023-11-22 — End: 2023-11-24
  Administered 2023-11-22 – 2023-11-24 (×4): 20 mg via INTRAVENOUS
  Filled 2023-11-22 (×4): qty 50

## 2023-11-22 MED ORDER — METHYLPREDNISOLONE SODIUM SUCC 40 MG IJ SOLR
40.0000 mg | Freq: Two times a day (BID) | INTRAMUSCULAR | Status: DC
  Administered 2023-11-22 – 2023-11-26 (×8): 40 mg via INTRAVENOUS
  Filled 2023-11-22 (×8): qty 1

## 2023-11-22 MED ORDER — MAGNESIUM SULFATE 2 GM/50ML IV SOLN
2.0000 g | Freq: Once | INTRAVENOUS | Status: AC
  Administered 2023-11-22: 2 g via INTRAVENOUS
  Filled 2023-11-22: qty 50

## 2023-11-22 MED ORDER — DOCUSATE SODIUM 100 MG PO CAPS: 100.0000 mg | ORAL_CAPSULE | Freq: Two times a day (BID) | ORAL | Status: DC | PRN

## 2023-11-22 MED ORDER — ACETAMINOPHEN 325 MG PO TABS: 650.0000 mg | ORAL_TABLET | Freq: Four times a day (QID) | ORAL | Status: DC | PRN

## 2023-11-22 MED ORDER — FENTANYL BOLUS VIA INFUSION
25.0000 ug | INTRAVENOUS | Status: DC | PRN
  Administered 2023-11-22 (×2): 50 ug via INTRAVENOUS
  Administered 2023-11-22 (×2): 100 ug via INTRAVENOUS
  Administered 2023-11-23: 75 ug via INTRAVENOUS
  Administered 2023-11-23: 50 ug via INTRAVENOUS
  Administered 2023-11-23 – 2023-11-24 (×5): 100 ug via INTRAVENOUS

## 2023-11-22 MED ORDER — HEPARIN (PORCINE) 25000 UT/250ML-% IV SOLN
1000.0000 [IU]/h | INTRAVENOUS | Status: DC
  Administered 2023-11-22: 1000 [IU]/h via INTRAVENOUS
  Filled 2023-11-22: qty 250

## 2023-11-22 MED ORDER — SODIUM CHLORIDE 0.9 % IV SOLN: 250.0000 mL | INTRAVENOUS | Status: AC

## 2023-11-22 MED ORDER — SODIUM CHLORIDE 0.9% FLUSH
10.0000 mL | Freq: Two times a day (BID) | INTRAVENOUS | Status: DC
  Administered 2023-11-22: 20 mL
  Administered 2023-11-23: 40 mL
  Administered 2023-11-23 – 2023-11-24 (×2): 10 mL
  Administered 2023-11-24: 20 mL
  Administered 2023-11-25 – 2023-11-28 (×7): 10 mL

## 2023-11-22 MED ORDER — DEXMEDETOMIDINE HCL IN NACL 200 MCG/50ML IV SOLN
0.0000 ug/kg/h | INTRAVENOUS | Status: DC
  Administered 2023-11-22 – 2023-11-23 (×3): 0.4 ug/kg/h via INTRAVENOUS
  Administered 2023-11-24: 0.8 ug/kg/h via INTRAVENOUS
  Filled 2023-11-22 (×4): qty 50

## 2023-11-22 MED ORDER — IOHEXOL 350 MG/ML SOLN
75.0000 mL | Freq: Once | INTRAVENOUS | Status: AC | PRN
  Administered 2023-11-22: 75 mL via INTRAVENOUS

## 2023-11-22 MED ORDER — ETOMIDATE 2 MG/ML IV SOLN
INTRAVENOUS | Status: AC
  Filled 2023-11-22: qty 20

## 2023-11-22 MED ORDER — KETAMINE HCL 50 MG/ML IJ SOLN
1.5000 mg/kg | Freq: Once | INTRAMUSCULAR | Status: AC
  Administered 2023-11-22: 80 mg via INTRAMUSCULAR

## 2023-11-22 MED ORDER — SODIUM BICARBONATE 8.4 % IV SOLN
INTRAVENOUS | Status: AC
  Filled 2023-11-22: qty 50

## 2023-11-22 MED ORDER — MIDAZOLAM HCL 2 MG/2ML IJ SOLN
INTRAMUSCULAR | Status: AC
  Administered 2023-11-22: 2 mg
  Filled 2023-11-22: qty 2

## 2023-11-22 MED ORDER — NOREPINEPHRINE 4 MG/250ML-% IV SOLN
0.0000 ug/min | INTRAVENOUS | Status: DC
  Administered 2023-11-22: 2 ug/min via INTRAVENOUS
  Filled 2023-11-22 (×2): qty 250

## 2023-11-22 MED ORDER — FENTANYL CITRATE PF 50 MCG/ML IJ SOSY
25.0000 ug | PREFILLED_SYRINGE | Freq: Once | INTRAMUSCULAR | Status: AC
  Administered 2023-11-22: 50 ug via INTRAVENOUS
  Filled 2023-11-22: qty 1

## 2023-11-22 MED ORDER — IPRATROPIUM-ALBUTEROL 0.5-2.5 (3) MG/3ML IN SOLN
3.0000 mL | Freq: Four times a day (QID) | RESPIRATORY_TRACT | Status: DC
  Administered 2023-11-22 – 2023-11-26 (×16): 3 mL via RESPIRATORY_TRACT
  Filled 2023-11-22 (×16): qty 3

## 2023-11-22 MED ORDER — FENTANYL CITRATE PF 50 MCG/ML IJ SOSY
PREFILLED_SYRINGE | INTRAMUSCULAR | Status: AC
  Filled 2023-11-22: qty 2

## 2023-11-22 MED ORDER — FENTANYL 2500MCG IN NS 250ML (10MCG/ML) PREMIX INFUSION
0.0000 ug/h | INTRAVENOUS | Status: DC
  Administered 2023-11-22: 50 ug/h via INTRAVENOUS
  Administered 2023-11-23: 125 ug/h via INTRAVENOUS
  Filled 2023-11-22 (×2): qty 250

## 2023-11-22 MED ORDER — ROCURONIUM BROMIDE 10 MG/ML (PF) SYRINGE
PREFILLED_SYRINGE | INTRAVENOUS | Status: AC
  Administered 2023-11-22: 54.4 mg via INTRAVENOUS
  Filled 2023-11-22: qty 10

## 2023-11-22 MED ORDER — ONDANSETRON HCL 4 MG PO TABS: 4.0000 mg | ORAL_TABLET | Freq: Four times a day (QID) | ORAL | Status: DC | PRN

## 2023-11-22 MED ORDER — CHLORHEXIDINE GLUCONATE CLOTH 2 % EX PADS
6.0000 | MEDICATED_PAD | Freq: Every day | CUTANEOUS | Status: DC
  Administered 2023-11-23 – 2023-11-28 (×5): 6 via TOPICAL

## 2023-11-22 MED ORDER — LACTATED RINGERS IV BOLUS
1500.0000 mL | Freq: Once | INTRAVENOUS | Status: AC
  Administered 2023-11-22: 1500 mL via INTRAVENOUS

## 2023-11-22 MED ORDER — ALBUTEROL SULFATE (2.5 MG/3ML) 0.083% IN NEBU: 10.0000 mg/h | INHALATION_SOLUTION | Freq: Once | RESPIRATORY_TRACT | Status: DC

## 2023-11-22 MED ORDER — HEPARIN BOLUS VIA INFUSION
3500.0000 [IU] | Freq: Once | INTRAVENOUS | Status: AC
  Administered 2023-11-22: 3500 [IU] via INTRAVENOUS
  Filled 2023-11-22: qty 3500

## 2023-11-22 MED ORDER — PHENYLEPHRINE 80 MCG/ML (10ML) SYRINGE FOR IV PUSH (FOR BLOOD PRESSURE SUPPORT)
PREFILLED_SYRINGE | INTRAVENOUS | Status: AC
  Filled 2023-11-22: qty 10

## 2023-11-22 MED ORDER — ACETAMINOPHEN 650 MG RE SUPP: 650.0000 mg | Freq: Four times a day (QID) | RECTAL | Status: DC | PRN

## 2023-11-22 MED ORDER — PREDNISONE 20 MG PO TABS: 40.0000 mg | ORAL_TABLET | Freq: Every day | ORAL | Status: DC

## 2023-11-22 MED ORDER — IPRATROPIUM BROMIDE 0.02 % IN SOLN
0.5000 mg | Freq: Once | RESPIRATORY_TRACT | Status: AC
  Administered 2023-11-22: 0.5 mg via RESPIRATORY_TRACT
  Filled 2023-11-22: qty 2.5

## 2023-11-22 MED ORDER — SODIUM CHLORIDE 0.9 % IV SOLN
2.0000 g | INTRAVENOUS | Status: AC
  Administered 2023-11-22 – 2023-11-26 (×5): 2 g via INTRAVENOUS
  Filled 2023-11-22 (×5): qty 20

## 2023-11-22 MED ORDER — SODIUM CHLORIDE 0.9 % IV SOLN
500.0000 mg | INTRAVENOUS | Status: DC
  Administered 2023-11-22 – 2023-11-24 (×3): 500 mg via INTRAVENOUS
  Filled 2023-11-22 (×3): qty 5

## 2023-11-22 NOTE — Progress Notes (Signed)
 PHARMACY - ANTICOAGULATION CONSULT NOTE  Pharmacy Consult for Heparin  Indication: r/o PE  Allergies  Allergen Reactions   Crestor [Rosuvastatin] Other (See Comments)    Urinary retention.   Sulfa Antibiotics Rash    Patient Measurements: Height: 5' 9 (175.3 cm) Weight: 54.4 kg (120 lb) IBW/kg (Calculated) : 70.7 HEPARIN  DW (KG): 54.4  Vital Signs: Temp: 97.6 F (36.4 C) (07/10 1125) Temp Source: Oral (07/10 1125) BP: 103/79 (07/10 1428) Pulse Rate: 116 (07/10 1428)  Labs: Recent Labs    11/22/23 1148  HGB 15.4  HCT 47.4  PLT 221  CREATININE 1.32*    Estimated Creatinine Clearance: 40.6 mL/min (A) (by C-G formula based on SCr of 1.32 mg/dL (H)).   Medical History: Past Medical History:  Diagnosis Date   Anxiety disorder, unspecified    Arthritis    Asthma    as a child   Asymmetrical hearing loss of left ear 01/03/2018   Bilateral carotid artery stenosis 08/07/2015   Dr. Dorn Lesches- 04/01/15- Heterogenous plaque- 1-39% RICA stenosis.  Homogenous plaque- 40-59% LICA stenosis.  >50% LECA stenosis.  Normal subclavian arteries bilaterally.  Patent vertebral arteries with antegrade flow.   Complication of anesthesia    slow to wake up   Constipation 08/07/2015   COPD (chronic obstructive pulmonary disease) (HCC)    Coronary artery disease involving native coronary artery of native heart without angina pectoris 04/03/2019   Displacement of lumbar intervertebral disc without myelopathy 04/14/2013   Dyspnea    Essential (primary) hypertension 04/03/2019   Golfer's elbow, right 03/30/2016   Heart murmur    History of kidney removal    left kidney . due to cancer   History of kidney stones    Hyperlipidemia 08/07/2015   Hypothyroidism due to Hashimoto's thyroiditis 07/15/2015   Leg weakness 08/07/2015   Lumbar radiculopathy 10/21/2013   Nocturia    Nonrheumatic aortic valve insufficiency 02/22/2019   Nonrheumatic aortic valve stenosis 02/22/2019   Spinal  stenosis of lumbar region 05/19/2015   Transitional cell carcinoma of bladder (HCC) UROLOGIST--  DR CEIL   S/P TURBT X3--  HX BCG TX'S   Vitamin D deficiency     Assessment: Active Problem(s): During eval for TURBT scheduled for 7/11, noted 3-4 days of worsening cough and shortness of breath. Tachy, 86% sats>>sent to ED  - pH 7.13  PMH: CAD, murmur, COPD, mild aortic stenosis, HTN. Tobacco,   AC/Heme: Baseline CBC WNL. No anticoag PTA. IV heparin  to r/o PE  Goal of Therapy:  Heparin  level 0.3-0.7 units/ml Monitor platelets by anticoagulation protocol: Yes   Plan:  Heparin  3500 unit IV bolus Heparin  infusion at 1000 units/hr Check heparin  level in 6-8 hrs Daily HL, CBC   Harry Young Harry Young, PharmD, BCPS Clinical Staff Pharmacist [facility] 11/22/2023,3:30 PM

## 2023-11-22 NOTE — IPAL (Signed)
  Interdisciplinary Goals of Care Family Meeting   Date carried out: 11/22/2023  Location of the meeting: Bedside  Member's involved: Nurse Practitioner, Bedside Registered Nurse, and Family Member or next of kin  Durable Power of Attorney or acting medical decision maker: wife and son-in law     Discussion: We discussed goals of care for Harry Young .  Would not want long term ventilation. We discussed this in the context of malignancy, copd and malnutrition. Based on this we have agreed to aggressive care including ventilation, central access and pressors/ if he arrests in-spite we would provide comfort measures only   Code status:   Code Status: Do not attempt resuscitation (DNR) - Comfort care   Disposition: Continue current acute care  Time spent for the meeting: 22    Jeralyn FORBES Banner, NP  11/22/2023, 6:39 PM

## 2023-11-22 NOTE — Progress Notes (Addendum)
 NAME:  Harry Young, MRN:  [id number], DOB:  1955/07/30, LOS: 0 ADMISSION DATE:  11/22/2023, CONSULTATION DATE: 7/10 REFERRING MD: Jonell, CHIEF COMPLAINT: Respiratory failure  History of Present Illness:  68 year old male patient followed previously in our office, with known history of COPD and active tobacco abuse.  He is currently being treated by urology with plan for bladder surgery initially scheduled for 7/11 for his known bladder cancer.  Presented to presurgical testing on 7/10 with plan for TURBT to be done by Dr. Christopher on the 11th.  Noting 3 to 4 days of worsening cough with associated shortness of breath he appeared pale on presentation, tachycardic, room air saturations were 86% and he was subsequently referred to the emergency room for further evaluation.  On arrival from preanesthesia to the ER pulse oximetry is in the 70s to 80s breathing in a tripod position placed on 15 L with improvement. He was subsequently started on NIPPV.  Initial evaluation in the emergency revealed the following: Venous blood gas with pH of 7.13 pCO2 of 86 pO2 of 51 HCO3 29 serum creatinine 1.32 this is around his baseline white blood cell count 22.3.  Portable chest x-ray was clear without focal infiltrates, was hyper distended  Critical care asked to evaluate.   Pertinent  Medical History  Tobacco abuse, emphysema/COPD, bladder cancer, left upper lobe spiculated lung mass, hearing loss, bilateral carotid artery stenosis, coronary artery disease, hypertension, hypothyroidism, mixed hyperlipidemia  Significant Hospital Events: Including procedures, antibiotic start and stop dates in addition to other pertinent events   7/10: pre-op>distress>ER, put on NIPPV. CXR no infiltrate> admit ICU for AECOPD/  Interim History / Subjective:  Wakes to voice, following commands, denies pain  Objective    Blood pressure (!) 115/56, pulse 88, temperature 98.1 F (36.7 C), temperature source Oral, resp. rate (!) 22,  height (P) 5' 9 (1.753 m), weight 54.4 kg, SpO2 100%.    Vent Mode: PRVC FiO2 (%):  [30 %-100 %] 100 % Set Rate:  [20 bmp] 20 bmp PEEP:  [5 cmH20] 5 cmH20 Pressure Support:  [10 cmH20-12 cmH20] 12 cmH20 Plateau Pressure:  [20 cmH20] 20 cmH20  No intake or output data in the 24 hours ending 11/22/23 2000 Filed Weights   11/22/23 1138  Weight: 54.4 kg    Examination: General: older appearing male, acute on chronically ill appearing, nad  HENT: ncat, anicteric sclera, ett, ogt, mmm Lungs: rhonchi R>L, no wheezing, vented, synchronous  Cardiovascular: s1s2, no m/r/g Abdomen: flat, soft, ntnd Extremities: thin Neuro: wakes spontaneously, follows commands, no focal deficit  GU: foley, pink tinged urine  Resolved problem list   Assessment and Plan   Acute hypoxic and hypercarbic respiratory failure 2/2 AECOPD COPD  Presents with sob, hypoxia. Onset 3-4 days ago. NRB>NIPPV>intubated. CXR in ED without focal infiltrate. CTA overnight without PE; however, has diffuse bronchial thickening and evidence of aspiration as well as multiple new nodules concerning for pulm metastasis. RVP negative.  - f/u tracheal aspirate culture, legionella, strep pna - d/c empiric heparin  that was on for PE coverage, will switch to ppx  - azithro and rocephin  coverage  - methylpred 40mg  q12h  - add brovana , yupelri  - duoneb QID  - full mechanical vent support - lung protective ventilation 6-8cc/kg Vt - VAP and PAD bundle in place  - titrate FiO2 to sat goal >92  - maintain peak/plats <30, driving pressures <84    Septic shock 2/2  Presents with 3-4 days prodromal symptoms. WBC 22.  Procal 2.63>6.70, lactic 5.5>4.5. CTA PE without large embolus. Echo without significant cardiac dysfunction. Cardiogenic shock ruled out.  - adding on UA and urine cultures, f/u  resp culture  - azithro and rocephin  for antibiotic coverage  - resend lactic to ensure it is clearing  - titrate NE to MAP > 65   Acute  metabolic encephalopathy  Likely secondary to his hypercarbia. Did receive ativan  in ED for work of breathing. Following commands appropriately 7/11 AM  - delirium precautions  - neuro checks  - supportive care   Hypophosphatemia  - repleting  - trend   CKD. 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 He was pending TURBT, in the setting of recurrence - surgery canceled  - f/u urology when appropriate   Severe protein calorie malnutrition in the setting of malignancy Hyperglycemia  - send A1c  - cbg q4h  - add ssi  - RD consult, TF added   Best Practice (right click and Reselect all SmartList Selections daily)   Diet/type: tubefeeds and NPO DVT prophylaxis prophylactic heparin   GI prophylaxis: H2B Lines: Central line and yes and it is still needed Foley:  Yes, and it is still needed Code Status:  limited Last date of multidisciplinary goals of care discussion [see IPAL]  Labs   CBC: Recent Labs  Lab 11/22/23 1148  WBC 22.3*  HGB 15.4  HCT 47.4  MCV 98.3  PLT 221    Basic Metabolic Panel: Recent Labs  Lab 11/22/23 1148 11/22/23 1640  NA 139  --   K 4.3  --   CL 101  --   CO2 26  --   GLUCOSE 152*  --   BUN 18  --   CREATININE 1.32*  --   CALCIUM 9.6  --   PHOS  --  3.1   GFR: Estimated Creatinine Clearance: 40.6 mL/min (A) (by C-G formula based on SCr of 1.32 mg/dL (H)). Recent Labs  Lab 11/22/23 1148 11/22/23 1640 11/22/23 1838  PROCALCITON  --  2.63  --   WBC 22.3*  --   --   LATICACIDVEN  --  5.5* 4.5*    Liver Function Tests: Recent Labs  Lab 11/22/23 1148  AST 22  ALT 19  ALKPHOS 108  BILITOT 1.9*  PROT 8.3*  ALBUMIN 4.7   No results for input(s): LIPASE, AMYLASE in the last 168 hours. No  results for input(s): AMMONIA in the last 168 hours.  ABG    Component Value Date/Time   HCO3 28.6 (H) 11/22/2023 1421   TCO2 30 01/12/2013 2121   ACIDBASEDEF 2.9 (H) 11/22/2023 1421   O2SAT 77.7 11/22/2023 1421     Coagulation Profile: No results for input(s): INR, PROTIME in the last 168 hours.  Cardiac Enzymes: No results for input(s): CKTOTAL, CKMB, CKMBINDEX, TROPONINI in the last 168 hours.  HbA1C: No results found for: HGBA1C  CBG: No results for input(s): GLUCAP in the last 168 hours.  Review of Systems:   Unable   Past Medical History:  He,  has a past medical history of Anxiety disorder, unspecified, Arthritis, Asthma, Asymmetrical hearing loss of left ear (01/03/2018), Bilateral carotid artery stenosis (08/07/2015), Complication of anesthesia, Constipation (08/07/2015), COPD (chronic obstructive pulmonary disease) (HCC), Coronary artery disease involving native  coronary artery of native heart without angina pectoris (04/03/2019), Displacement of lumbar intervertebral disc without myelopathy (04/14/2013), Dyspnea, Essential (primary) hypertension (04/03/2019), Golfer's elbow, right (03/30/2016), Heart murmur, History of kidney removal, History of kidney stones, Hyperlipidemia (08/07/2015), Hypothyroidism due to Hashimoto's thyroiditis (07/15/2015), Leg weakness (08/07/2015), Lumbar radiculopathy (10/21/2013), Nocturia, Nonrheumatic aortic valve insufficiency (02/22/2019), Nonrheumatic aortic valve stenosis (02/22/2019), Spinal stenosis of lumbar region (05/19/2015), Transitional cell carcinoma of bladder (HCC) (UROLOGIST--  DR CEIL), and Vitamin D deficiency.   Surgical History:   Past Surgical History:  Procedure Laterality Date   BRONCHIAL BIOPSY  01/09/2022   Procedure: BRONCHIAL BIOPSIES;  Surgeon: Gladis Leonor HERO, MD;  Location: [facility] ENDOSCOPY;  Service: Pulmonary;;   BRONCHIAL BRUSHINGS  01/09/2022   Procedure: BRONCHIAL BRUSHINGS;  Surgeon:  Gladis Leonor HERO, MD;  Location: [facility] ENDOSCOPY;  Service: Pulmonary;;   BRONCHIAL NEEDLE ASPIRATION BIOPSY  01/09/2022   Procedure: BRONCHIAL NEEDLE ASPIRATION BIOPSIES;  Surgeon: Gladis Leonor HERO, MD;  Location: [facility] ENDOSCOPY;  Service: Pulmonary;;   CYSTOSCOPY  05/10/2022   Procedure: CYSTOSCOPY WITH STENT REMOVAL;  Surgeon: Alvaro Hummer, MD;  Location: [facility];  Service: Urology;;   PHYLLIS W/ RETROGRADES Bilateral 12/13/2012   Procedure: CYSTOSCOPY WITH RETROGRADE PYELOGRAM;  Surgeon: Mark C Ottelin, MD;  Location: [facility];  Service: Urology;  Laterality: Bilateral;   CYSTOSCOPY W/ RETROGRADES Right 04/27/2023   Procedure: CYSTOSCOPY WITH RIGHT RETROGRADE PYELOGRAM;  Surgeon: Alvaro Hummer KATHEE Mickey., MD;  Location: [facility];  Service: Urology;  Laterality: Right;  60 MINUTES NEEDED FOR CASE   CYSTOSCOPY WITH LITHOLAPAXY N/A 04/04/2013   Procedure: CYSTOSCOPY ;  Surgeon: Oneil JAYSON CEIL, MD;  Location[facility];  Service: Urology;  Laterality: N/A;   CYSTOSCOPY WITH RETROGRADE PYELOGRAM, URETEROSCOPY AND STENT PLACEMENT Bilateral 02/28/2022   Procedure: CYSTOSCOPY WITH RETROGRADE PYELOGRAM, URETEROSCOPY, LEFT STENT PLACEMENT;  Surgeon: Rosalind Zachary KATHEE, MD;  Location: [facility];  Service: Urology;  Laterality: Bilateral;  1 HR   LUMBAR DISC SURGERY  09/20/2005   L2 -- L5   LUMBAR LAMINECTOMY/DECOMPRESSION MICRODISCECTOMY Left 01/09/2013   Procedure: Left  lumbar four-five microdiskectomy;  Surgeon: Rockey LITTIE Peru, MD;  Location: [facility];  Service: Neurosurgery;  Laterality: Left;   LUMBAR WOUND DEBRIDEMENT N/A 01/13/2013   Procedure: LUMBAR WOUND DEBRIDEMENT;  Surgeon: Victory DELENA Gunnels, MD;  Location: [facility];  Service: Neurosurgery;  Laterality: N/A;  Lumbar Wound Exploration,  Attempted Placement of Cerebrospinal Drain, and Repair of CSF Leak   ROBOT ASSITED LAPAROSCOPIC NEPHROURETERECTOMY Left 05/10/2022   Procedure: XI ROBOT ASSITED LAPAROSCOPIC  NEPHROURETERECTOMY;  Surgeon: Alvaro Hummer, MD;  Location: [facility];  Service: Urology;  Laterality: Left;   TOOTH EXTRACTION     TRANSURETHRAL RESECTION OF BLADDER TUMOR  07-05-2009;   11-22-2009   bladder cancer   TRANSURETHRAL RESECTION OF BLADDER TUMOR N/A 12/13/2012   Procedure: TRANSURETHRAL RESECTION OF BLADDER TUMOR (TURBT) AND INSTILLATION OF MYTOMYCIN;  Surgeon: Mark C Ottelin, MD;  Location: [facility];  Service: Urology;  Laterality: N/A;   TRANSURETHRAL RESECTION OF BLADDER TUMOR N/A 04/27/2023   Procedure: TRANSURETHRAL RESECTION OF BLADDER TUMOR (TURBT);  Surgeon: Alvaro Hummer KATHEE Mickey., MD;  Location: [facility];  Service: Urology;  Laterality: N/A;   TRANSURETHRAL RESECTION OF BLADDER TUMOR WITH GYRUS (TURBT-GYRUS) N/A 04/04/2013   Procedure: TRANSURETHRAL RESECTION OF BLADDER TUMOR WITH GYRUS (TURBT-GYRUS);  Surgeon: Mark C Ottelin, MD;  Location: [facility];  Service: Urology;  Laterality: N/A;  Social History:   reports that he has been smoking cigarettes. He started smoking about 55 years ago. He has a 55.4 pack-year smoking history. He has never used smokeless tobacco. He reports that he does not drink alcohol and does not use drugs.   Family History:  His family history includes COPD in his father; Diabetes in his brother; Heart disease in his father; Heart failure in his father; Hyperlipidemia in his mother.   Allergies Allergies  Allergen Reactions   Crestor [Rosuvastatin] Other (See Comments)    Urinary retention.   Sulfa Antibiotics Rash     Home Medications  Prior to Admission medications   Medication Sig Start Date End Date Taking? Authorizing Provider  albuterol  (VENTOLIN  HFA) 108 (90 Base) MCG/ACT inhaler Inhale 1-2 puffs into the lungs every 6 (six) hours as needed for shortness of breath or wheezing. 12/13/21   [provider]  amLODipine  (NORVASC ) 5 MG tablet TAKE 1 TABLET BY MOUTH EVERY DAY Patient taking  differently: Take 2.5 mg by mouth daily. 06/28/20   Patwardhan, Newman PARAS, MD  BREO ELLIPTA  200-25 MCG/ACT AEPB Inhale 1 puff into the lungs daily. 04/14/22   [provider]  Calcium-Magnesium -Zinc (CAL-MAG-ZINC PO) Take 1 tablet by mouth daily.    [provider]  Cholecalciferol (VITAMIN D3) 25 MCG (1000 UT) CAPS Take 1,000 Units by mouth daily with supper.    [provider]  Coenzyme Q10 (CO Q 10 PO) Take 200 mg by mouth daily.    [provider]  ezetimibe (ZETIA) 10 MG tablet Take 10 mg by mouth daily. 06/18/18   [provider]  Homeopathic Products (RESTFUL LEGS SL) Place 1 tablet under the tongue at bedtime as needed (restless legs).    [provider]  HYDROcodone -acetaminophen  (NORCO) 10-325 MG tablet Take 1 tablet by mouth 2 (two) times daily as needed for moderate pain (pain score 4-6).    [provider]  levothyroxine  (SYNTHROID ) 75 MCG tablet Take 75 mcg by mouth daily before breakfast.    [provider]  Omega-3 Fatty Acids (FISH OIL) 500 MG CAPS Take 500 mg by mouth daily.    [provider]  simvastatin (ZOCOR) 5 MG tablet Take 5 mg by mouth once a week. 03/15/22   [provider]     Critical care time: 40    Tinnie FORBES Adolph DEVONNA [facility] 11/23/23 8:16 AM  Please see [email or web address] for pager details.  From 7A-7P if no response, please call [phone] After hours, please call ELink [phone]

## 2023-11-22 NOTE — ED Triage Notes (Addendum)
 Patient sent from presurgical testing. Was scheduled for bladder surgery tomorrow for bladder cancer. Room air saturation was 70%. Patient unable to speak in full sentences, tripod position. Placed on 15L non-rebreather and oxygen improved to 100%. Dr. Neysa and respiratory called to bedside. Has COPD.

## 2023-11-22 NOTE — Progress Notes (Signed)
   11/22/23 2341  Vent Select  $ Transport Ventilator  Yes  Adult Vent Y  Adult Ventilator Measurements  SpO2 100 %  VAP Prevention  Transported while on vent Yes  HOB> 30 Degrees Y  Suction Method  Respiratory Interventions Airway suction;Oral suction  Suctioned and Oral Care Prior to Transport Yes  Airway Suctioning/Secretions  Suction Type ETT  Suction Device  Catheter  Secretion Amount Small  Secretion Color Tan  Secretion Consistency Thick  Suction Tolerance Tolerated well  Suctioning Adverse Effects None

## 2023-11-22 NOTE — H&P (Signed)
 History and Physical    Patient: Harry Young FMW:[id number] DOB: 1955-12-27 DOA: 11/22/2023 DOS: the patient was seen and examined on 11/22/2023 PCP: Corlis Pagan, NP  Patient coming from: Home  Chief Complaint:  Chief Complaint  Patient presents with   Shortness of Breath   HPI: Harry Young is a 68 y.o. male with medical history significant of anxiety, osteoarthritis, COPD, asthma as a child, bilateral carotid stenosis, constipation, angina, essential hypertension, right golfers elbow, heart murmur, nephrolithiasis, transitional cell bladder cancer, urothelial carcinoma with left kidney nephrectomy, hypothyroidism, spinal stenosis of lumbar spine, lumbar radiculopathy, nonrheumatic aortic valve insufficiency, nonrheumatic aortic valve stenosis, vitamin D deficiency who presented to the emergency department with dyspnea and hypoxia of 70%.  He is currently sedated after being given lorazepam  in the emergency department.  According to his wife he has been coughing, wheezing and progressively more dyspneic in the past 3 days.  He was getting presurgical evaluation when he got severely dyspneic and had to come to the emergency department.  Lab work: CBC showed a white count of 22.3, hemoglobin 15.4 g/dL and platelets 798.  CMP showed a total protein of 8.3 g/dL, glucose 847, creatinine 1.32 and total bilirubin 1.9 mg/dL.  Imaging: Portable 1 view chest radiograph showing hyperinflated lungs, which can be seen in the setting of COPD.  No focal consolidations.  ED course: Initial vital signs were temperature 97.6 F, pulse 115, respiration 28, BP 157/76 mmHg O2 sat 70% on room air.  The patient received an albuterol  10 and ipratropium 0.5 mg continuous neb treatment, lorazepam  0.5 mg IVP x 1, magnesium  sulfate 2 g IVPB and methylprednisolone  125 mg IVPB.  Review of Systems: As mentioned in the history of present illness. All other systems reviewed and are negative. Past Medical History:   Diagnosis Date   Anxiety disorder, unspecified    Arthritis    Asthma    as a child   Asymmetrical hearing loss of left ear 01/03/2018   Bilateral carotid artery stenosis 08/07/2015   Dr. Dorn Lesches- 04/01/15- Heterogenous plaque- 1-39% RICA stenosis.  Homogenous plaque- 40-59% LICA stenosis.  >50% LECA stenosis.  Normal subclavian arteries bilaterally.  Patent vertebral arteries with antegrade flow.   Complication of anesthesia    slow to wake up   Constipation 08/07/2015   COPD (chronic obstructive pulmonary disease) (HCC)    Coronary artery disease involving native coronary artery of native heart without angina pectoris 04/03/2019   Displacement of lumbar intervertebral disc without myelopathy 04/14/2013   Dyspnea    Essential (primary) hypertension 04/03/2019   Golfer's elbow, right 03/30/2016   Heart murmur    History of kidney removal    left kidney . due to cancer   History of kidney stones    Hyperlipidemia 08/07/2015   Hypothyroidism due to Hashimoto's thyroiditis 07/15/2015   Leg weakness 08/07/2015   Lumbar radiculopathy 10/21/2013   Nocturia    Nonrheumatic aortic valve insufficiency 02/22/2019   Nonrheumatic aortic valve stenosis 02/22/2019   Spinal stenosis of lumbar region 05/19/2015   Transitional cell carcinoma of bladder (HCC) UROLOGIST--  DR CEIL   S/P TURBT X3--  HX BCG TX'S   Vitamin D deficiency    Past Surgical History:  Procedure Laterality Date   BRONCHIAL BIOPSY  01/09/2022   Procedure: BRONCHIAL BIOPSIES;  Surgeon: Gladis Leonor HERO, MD;  Location: [facility] ENDOSCOPY;  Service: Pulmonary;;   BRONCHIAL BRUSHINGS  01/09/2022   Procedure: BRONCHIAL BRUSHINGS;  Surgeon: Gladis Leonor HERO, MD;  Location: [facility] ENDOSCOPY;  Service: Pulmonary;;   BRONCHIAL NEEDLE ASPIRATION BIOPSY  01/09/2022   Procedure: BRONCHIAL NEEDLE ASPIRATION BIOPSIES;  Surgeon: Gladis Leonor HERO, MD;  Location: [facility] ENDOSCOPY;  Service: Pulmonary;;   CYSTOSCOPY  05/10/2022    Procedure: CYSTOSCOPY WITH STENT REMOVAL;  Surgeon: Alvaro Hummer, MD;  Location: [facility] ORS;  Service: Urology;;   PHYLLIS W/ RETROGRADES Bilateral 12/13/2012   Procedure: CYSTOSCOPY WITH RETROGRADE PYELOGRAM;  Surgeon: Mark C Ottelin, MD;  Location: [facility];  Service: Urology;  Laterality: Bilateral;   CYSTOSCOPY W/ RETROGRADES Right 04/27/2023   Procedure: CYSTOSCOPY WITH RIGHT RETROGRADE PYELOGRAM;  Surgeon: Alvaro Hummer KATHEE Mickey., MD;  Location: [facility] ORS;  Service: Urology;  Laterality: Right;  60 MINUTES NEEDED FOR CASE   CYSTOSCOPY WITH LITHOLAPAXY N/A 04/04/2013   Procedure: CYSTOSCOPY ;  Surgeon: Oneil JAYSON Rafter, MD;  Location: [facility];  Service: Urology;  Laterality: N/A;   CYSTOSCOPY WITH RETROGRADE PYELOGRAM, URETEROSCOPY AND STENT PLACEMENT Bilateral 02/28/2022   Procedure: CYSTOSCOPY WITH RETROGRADE PYELOGRAM, URETEROSCOPY, LEFT STENT PLACEMENT;  Surgeon: Rosalind Zachary KATHEE, MD;  Location: [facility];  Service: Urology;  Laterality: Bilateral;  1 HR   LUMBAR DISC SURGERY  09/20/2005   L2 -- L5   LUMBAR LAMINECTOMY/DECOMPRESSION MICRODISCECTOMY Left 01/09/2013   Procedure: Left  lumbar four-five microdiskectomy;  Surgeon: Rockey LITTIE Peru, MD;  Location: [facility] NEURO ORS;  Service: Neurosurgery;  Laterality: Left;   LUMBAR WOUND DEBRIDEMENT N/A 01/13/2013   Procedure: LUMBAR WOUND DEBRIDEMENT;  Surgeon: Victory DELENA Gunnels, MD;  Location: [facility] NEURO ORS;  Service: Neurosurgery;  Laterality: N/A;  Lumbar Wound Exploration,  Attempted Placement of Cerebrospinal Drain, and Repair of CSF Leak   ROBOT ASSITED LAPAROSCOPIC NEPHROURETERECTOMY Left 05/10/2022   Procedure: XI ROBOT ASSITED LAPAROSCOPIC NEPHROURETERECTOMY;  Surgeon: Alvaro Hummer, MD;  Location: [facility];  Service: Urology;  Laterality: Left;   TOOTH EXTRACTION     TRANSURETHRAL RESECTION OF BLADDER TUMOR  07-05-2009;   11-22-2009   bladder cancer   TRANSURETHRAL RESECTION OF BLADDER TUMOR N/A 12/13/2012    Procedure: TRANSURETHRAL RESECTION OF BLADDER TUMOR (TURBT) AND INSTILLATION OF MYTOMYCIN;  Surgeon: Mark C Ottelin, MD;  Location: [facility];  Service: Urology;  Laterality: N/A;   TRANSURETHRAL RESECTION OF BLADDER TUMOR N/A 04/27/2023   Procedure: TRANSURETHRAL RESECTION OF BLADDER TUMOR (TURBT);  Surgeon: Alvaro Hummer KATHEE Mickey., MD;  Location: [facility];  Service: Urology;  Laterality: N/A;   TRANSURETHRAL RESECTION OF BLADDER TUMOR WITH GYRUS (TURBT-GYRUS) N/A 04/04/2013   Procedure: TRANSURETHRAL RESECTION OF BLADDER TUMOR WITH GYRUS (TURBT-GYRUS);  Surgeon: Mark C Ottelin, MD;  Location: [facility];  Service: Urology;  Laterality: N/A;   Social History:  reports that he has been smoking cigarettes. He started smoking about 55 years ago. He has a 55.4 pack-year smoking history. He has never used smokeless tobacco. He reports that he does not drink alcohol and does not use drugs.  Allergies  Allergen Reactions   Crestor [Rosuvastatin] Other (See Comments)    Urinary retention.   Sulfa Antibiotics Rash    Family History  Problem Relation Age of Onset   Hyperlipidemia Mother    Heart disease Father    Heart failure Father    COPD Father    Diabetes Brother     Prior to Admission medications   Medication Sig Start Date End Date Taking? Authorizing Provider  albuterol  (VENTOLIN  HFA) 108 (90 Base) MCG/ACT inhaler Inhale 1-2 puffs into the lungs every 6 (  six) hours as needed for shortness of breath or wheezing. 12/13/21   [provider]  amLODipine  (NORVASC ) 5 MG tablet TAKE 1 TABLET BY MOUTH EVERY DAY Patient taking differently: Take 2.5 mg by mouth daily. 06/28/20   Patwardhan, Newman PARAS, MD  BREO ELLIPTA  200-25 MCG/ACT AEPB Inhale 1 puff into the lungs daily. 04/14/22   [provider]  Calcium-Magnesium -Zinc (CAL-MAG-ZINC PO) Take 1 tablet by mouth daily.    [provider]  Cholecalciferol (VITAMIN D3) 25 MCG (1000 UT) CAPS Take  1,000 Units by mouth daily with supper.    [provider]  Coenzyme Q10 (CO Q 10 PO) Take 200 mg by mouth daily.    [provider]  ezetimibe (ZETIA) 10 MG tablet Take 10 mg by mouth daily. 06/18/18   [provider]  Homeopathic Products (RESTFUL LEGS SL) Place 1 tablet under the tongue at bedtime as needed (restless legs).    [provider]  HYDROcodone -acetaminophen  (NORCO) 10-325 MG tablet Take 1 tablet by mouth 2 (two) times daily as needed for moderate pain (pain score 4-6).    [provider]  levothyroxine  (SYNTHROID ) 75 MCG tablet Take 75 mcg by mouth daily before breakfast.    [provider]  Omega-3 Fatty Acids (FISH OIL) 500 MG CAPS Take 500 mg by mouth daily.    [provider]  simvastatin (ZOCOR) 5 MG tablet Take 5 mg by mouth once a week. 03/15/22   [provider]    Physical Exam: Vitals:   11/22/23 1151 11/22/23 1202 11/22/23 1206 11/22/23 1300  BP:    (!) 164/133  Pulse: (!) 119   (!) 121  Resp: (!) 34   (!) 23  SpO2: 100% 100% 100% 97%  Weight:      Height:       Physical Exam Vitals reviewed.  Constitutional:      Appearance: He is underweight. He is ill-appearing.     Interventions: Face mask in place.     Comments: BiPAP mask on.  HENT:     Head: Normocephalic.     Nose: No rhinorrhea.     Mouth/Throat:     Mouth: Mucous membranes are dry.  Eyes:     General: No scleral icterus.    Pupils: Pupils are equal, round, and reactive to light.  Neck:     Vascular: No JVD.  Cardiovascular:     Rate and Rhythm: Normal rate and regular rhythm.  Pulmonary:     Effort: Tachypnea, accessory muscle usage and respiratory distress present.     Breath sounds: Decreased breath sounds, wheezing and rhonchi present. No rales.  Abdominal:     General: Bowel sounds are normal. There is no distension.     Palpations: Abdomen is soft.     Tenderness: There is no abdominal tenderness. There is no right  CVA tenderness or left CVA tenderness.  Musculoskeletal:     Cervical back: Neck supple.  Skin:    General: Skin is warm and dry.  Neurological:     General: No focal deficit present.     Mental Status: He is lethargic.     Data Reviewed:  Results are pending, will review when available.  Assessment and Plan: Principal Problem:   Acute respiratory failure with hypoxia and hypercapnia (HCC) Due to:   COPD with acute exacerbation [facility]) PCCM consulted. Patient failed BiPAP. Patient needed ET intubation. Methylprednisolone  125 mg IVP x1. Followed by methylprednisolone  40 mg every 12 hours. Scheduled  and as needed bronchodilators. Will continue management per PCCM.  Active Problems:   Essential (primary) hypertension   Hypothyroidism, unspecified   Hyperlipidemia Resume home meds once extubated.     Advance Care Planning:   Code Status: Full Code   Consults: PCCM Evelene Gore, MD)  Family Communication: I spoke to his wife Harry Young and updated her about intubation need.  She agree with intubation..  Severity of Illness: The appropriate patient status for this patient is OBSERVATION. Observation status is judged to be reasonable and necessary in order to provide the required intensity of service to ensure the patient's safety. The patient's presenting symptoms, physical exam findings, and initial radiographic and laboratory data in the context of their medical condition is felt to place them at decreased risk for further clinical deterioration. Furthermore, it is anticipated that the patient will be medically stable for discharge from the hospital within 2 midnights of admission.   Author: Alm Dorn Castor, MD 11/22/2023 2:05 PM  For on call review [email or web address].   This document was prepared using Dragon voice recognition software and may contain some unintended transcription errors.

## 2023-11-22 NOTE — Progress Notes (Signed)
 Echocardiogram 2D Echocardiogram has been performed.  Harry Young Delrick Dehart RDCS 11/22/2023, 6:28 PM

## 2023-11-22 NOTE — Progress Notes (Signed)
 On arrival to ICU Work of breathing and mental status initially improved c/w initial eval which was shortly after starting dex After transfer to bed had inc'd wob, grabbing at mask, saying can't breath. Staff was able to talk him down from this and he remains awake; resp efforts are labored but not as bad If we can hold here we may be able to avoid intubation  POCUS hard to get good windows. Hyperdynamic.   Plan Will try 1 hr of dex to see if we can facilitate ventilation while avoiding oversedation (ideally avoiding intubation if able)  While doing this will get stat echo r/o PE.  Awaiting lactate   If PE identified and develops refractory shock  ELSO guidelines list this as relative contraindication w/ massive PE and known malignancy, additional relative contraindication would be his malnutrition; his age would disqualify him should he have cardiac arrest.  His Save score -14 which would suggest 18% post ecmo survival  Certainly would have high risk of mortality either way.

## 2023-11-22 NOTE — Progress Notes (Addendum)
 Addendum for consult note 7/10.   I agree with the Advanced Practitioner's note, impression, and recommendations as outlined. I have taken an independent interval history, reviewed the chart and examined the patient.  My medical decision making is as follows:   Subjective: 68 yo man with history of bladder cancer who present in outpatient anesthesia preop for TURBT. Tobacco use disorder quit 2 weeks ago. Was in preop clinic in respiratory distress and referred to ED. Found to be hypoxemic on room air, placed on non rebreather and then bipap. Given ativan  for respiratory distress. Initally admitted to [facility] but became progressively encephalopathic and pccm consulted for admission. History limited to chart review and discussion with bedside providers. Appears he had some respiratory infection prodrome symptoms prior to today.   Objective: Blood pressure 103/79, pulse (!) 116, resp. rate (!) 24, height 5' 9 (1.753 m), weight 54.4 kg, SpO2 97%.  Acutely and chronically ill appearing in respiratory distress on bipap, thin Somnolent, arousable to touch  Opens eyes and can answer in one word sentences Breath sounds diminished bilaterally, no wheeze Tachycardic, regular sarcopenia   Labs/Imaging: Chest xray with hyperinflation VBG shows hypercapnia  WBC 22.3 Hgb 15.4 Na 139 K 4.3 Cr 1.32  Assessment and Plan:  Acute hypoxemic and hypercapnic respiratory failure COPD with acute exacerbation History of tobacco use disorder - quit 2 weeks ago Severe protein calorie malnutrition  Admit to ICU. Will need intubation most likely. Lung protective ventilation. Empiric heparin  until we can rule out PE. Empiric cap coverage. Steroids. Send PCT.   The patient is critically ill due to respiratory failure with multiple organ systems failure and requires high complexity decision making for assessment and support, frequent evaluation and titration of therapies, application of advanced monitoring  technologies and extensive interpretation of multiple databases.   Critical Care Time devoted to patient care services described in this note is 42 minutes. This time reflects time of care of this signee Rozetta Stumpp S Minette Manders . This critical care time does not reflect separately billable procedures or procedure time, teaching time and supervisory time of PA/NP/Med student/Med Resident etc but could involve care discussion time. I performed the substantive portion of the medical decision making for this patient.   Harry Young Pulmonary and Critical Care Medicine 11/22/2023 3:17 PM  Pager: see AMION  If no response to pager, please call critical care on call (see AMION) until 7pm After 7:00 pm call Elink

## 2023-11-22 NOTE — Progress Notes (Signed)
 For Anesthesia: PCP - Corlis Pagan, NP  Cardiologist - Newman Lawrence, MD   Bowel Prep reminder:  Chest x-ray -  EKG - 04/25/23 Stress Test - Test - 03/24/19 Epic  ECHO - 07/27/20 Epic  Cardiac Cath -  Pacemaker/ICD device last checked: Pacemaker orders received: Device Rep notified:  Spinal Cord Stimulator:  Sleep Study -  CPAP -   Fasting Blood Sugar -  Checks Blood Sugar _____ times a day Date and result of last Hgb A1c-  Last dose of GLP1 agonist-  GLP1 instructions:   Last dose of SGLT-2 inhibitors-  SGLT-2 instructions:   Blood Thinner Instructions: Aspirin Instructions: Last Dose:  Activity level: Can go up a flight of stairs and activities of daily living without stopping and without chest pain and/or shortness of breath   Able to exercise without chest pain and/or shortness of breath  Anesthesia review: CAD, murmur, COPD, mild aortic stenosis, HTN.  Patient denies shortness of breath, fever, cough and chest pain at PAT appointment   Patient verbalized understanding of instructions that were reviewed over the telephone.

## 2023-11-22 NOTE — Procedures (Signed)
 Central Venous Catheter Insertion Procedure Note  Harry Young  [id number]  1955-06-25  Date:11/22/23  Time:7:09 PM   Provider Performing:Pete FORBES Jenna   Procedure: Insertion of Non-tunneled Central Venous [phone]) with US  guidance (23062)   Indication(s) Medication administration and Difficult access  Consent Risks of the procedure as well as the alternatives and risks of each were explained to the patient and/or caregiver.  Consent for the procedure was obtained and is signed in the bedside chart  Anesthesia Topical only with 1% lidocaine    Timeout Verified patient identification, verified procedure, site/side was marked, verified correct patient position, special equipment/implants available, medications/allergies/relevant history reviewed, required imaging and test results available.  Sterile Technique Maximal sterile technique including full sterile barrier drape, hand hygiene, sterile gown, sterile gloves, mask, hair covering, sterile ultrasound probe cover (if used).  Procedure Description Area of catheter insertion was cleaned with chlorhexidine  and draped in sterile fashion.  With real-time ultrasound guidance a central venous catheter was placed into the right internal jugular vein. Nonpulsatile blood flow and easy flushing noted in all ports.  The catheter was sutured in place and sterile dressing applied.  Complications/Tolerance None; patient tolerated the procedure well. Chest X-ray is ordered to verify placement for internal jugular or subclavian cannulation.   Chest x-ray is not ordered for femoral cannulation.  EBL Minimal  Specimen(s) None

## 2023-11-22 NOTE — Procedures (Signed)
 Intubation Procedure Note  Harry Young  [id number]  Nov 06, 1955  Date:11/22/23  Time:7:08 PM   Provider Performing:Pete E Jenna    Procedure: Intubation (31500)  Indication(s) Respiratory Failure  Consent Risks of the procedure as well as the alternatives and risks of each were explained to the patient and/or caregiver.  Consent for the procedure was obtained and is signed in the bedside chart   Anesthesia Versed , Rocuronium , and 80mg  ketamine    Time Out Verified patient identification, verified procedure, site/side was marked, verified correct patient position, special equipment/implants available, medications/allergies/relevant history reviewed, required imaging and test results available.   Sterile Technique Usual hand hygeine, masks, and gloves were used   Procedure Description Patient positioned in bed supine.  Sedation given as noted above.  Patient was intubated with endotracheal tube using Glidescope.  View was Grade 1 full glottis .  Number of attempts was 1.  Colorimetric CO2 detector was consistent with tracheal placement.   Complications/Tolerance None; patient tolerated the procedure well. Chest X-ray is ordered to verify placement.   EBL Minimal   Specimen(s) None

## 2023-11-22 NOTE — Progress Notes (Signed)
 RT notified RN that PT room door should remain open in order to hear possible BiPAP alarms.

## 2023-11-22 NOTE — Progress Notes (Addendum)
 68 yo male with COPD presents to PAT with 3-4 days of worsening cough and shortness of breath. Having TURBT tomorrow with Dr. Alvaro. He has been using his inhalers without significant relief (Breo and albuterol ). No fever or chest pain. Still smoking. On exam he appears pale, chronically ill, cachetic. Heart rate is tachycardic and regular. Lung sounds decreased with wheezes and coarse cough. Sats are 86% on RA and HR is 112. He appears in respiratory distress. Advised ED evaluation

## 2023-11-22 NOTE — Progress Notes (Signed)
 Now intubated 2/2 increased WOB and rising lactate.  Placed CVL given my discussion w/ family.  ECHO results discussed w/ cards RV nml so unlikely to be acute PE  Plan Cont to rx as AECOPD and septic shock Getting CT angio for r/o but feel more assured about this w/ RV fxn intact.

## 2023-11-22 NOTE — ED Provider Notes (Addendum)
 [place] EMERGENCY DEPARTMENT AT [facility] Provider Note   CSN: [id number] Arrival date & time: 11/22/23  1132     Patient presents with: Shortness of Breath   Harry Young is a 68 y.o. male.   68 year old with COPD not on oxygen presenting for hypoxia and shortness of breath.  See ED course for further HPI.   Shortness of Breath      Prior to Admission medications   Medication Sig Start Date End Date Taking? Authorizing Provider  albuterol  (VENTOLIN  HFA) 108 (90 Base) MCG/ACT inhaler Inhale 1-2 puffs into the lungs every 6 (six) hours as needed for shortness of breath or wheezing. 12/13/21   [provider]  amLODipine  (NORVASC ) 5 MG tablet TAKE 1 TABLET BY MOUTH EVERY DAY Patient taking differently: Take 2.5 mg by mouth daily. 06/28/20   Patwardhan, Newman PARAS, MD  BREO ELLIPTA  200-25 MCG/ACT AEPB Inhale 1 puff into the lungs daily. 04/14/22   [provider]  Calcium-Magnesium -Zinc (CAL-MAG-ZINC PO) Take 1 tablet by mouth daily.    [provider]  Cholecalciferol (VITAMIN D3) 25 MCG (1000 UT) CAPS Take 1,000 Units by mouth daily with supper.    [provider]  Coenzyme Q10 (CO Q 10 PO) Take 200 mg by mouth daily.    [provider]  ezetimibe (ZETIA) 10 MG tablet Take 10 mg by mouth daily. 06/18/18   [provider]  Homeopathic Products (RESTFUL LEGS SL) Place 1 tablet under the tongue at bedtime as needed (restless legs).    [provider]  HYDROcodone -acetaminophen  (NORCO) 10-325 MG tablet Take 1 tablet by mouth 2 (two) times daily as needed for moderate pain (pain score 4-6).    [provider]  levothyroxine  (SYNTHROID ) 75 MCG tablet Take 75 mcg by mouth daily before breakfast.    [provider]  Omega-3 Fatty Acids (FISH OIL) 500 MG CAPS Take 500 mg by mouth daily.    [provider]  simvastatin (ZOCOR) 5 MG tablet Take 5 mg by mouth once a week. 03/15/22   [provider]    Allergies: Crestor [rosuvastatin] and Sulfa antibiotics    Review of Systems  Respiratory:  Positive for shortness of breath.     Updated Vital Signs BP (!) 164/133   Pulse (!) 121   Resp (!) 23   Ht 5' 9 (1.753 m)   Wt 54.4 kg   SpO2 97%   BMI 17.72 kg/m   Physical Exam Vitals and nursing note reviewed.  Constitutional:      General: He is in acute distress.  Cardiovascular:     Rate and Rhythm: Regular rhythm. Tachycardia present.  Pulmonary:     Effort: Tachypnea, accessory muscle usage and respiratory distress present.     Breath sounds: Decreased breath sounds and wheezing present.  Musculoskeletal:     Right lower leg: No edema.     Left lower leg: No edema.  Skin:    General: Skin is warm.     Capillary Refill: Capillary refill takes less than 2 seconds.  Neurological:     Mental Status: He is alert and oriented to person, place, and time.  Psychiatric:        Mood and Affect: Mood is anxious.        Behavior: Behavior normal.     (all labs ordered are listed, but only abnormal results are displayed) Labs Reviewed  CBC - Abnormal; Notable for the following components:  Result Value   WBC 22.3 (*)    All other components within normal limits  COMPREHENSIVE METABOLIC PANEL WITH GFR - Abnormal; Notable for the following components:   Glucose, Bld 152 (*)    Creatinine, Ser 1.32 (*)    Total Protein 8.3 (*)    Total Bilirubin 1.9 (*)    GFR, Estimated 58 (*)    All other components within normal limits  TSH  PHOSPHORUS    EKG: None  Radiology: DG Chest Portable 1 View Result Date: 11/22/2023 CLINICAL DATA:  Hypoxia EXAM: PORTABLE CHEST 1 VIEW COMPARISON:  Chest radiograph dated 01/09/2022 FINDINGS: Hyperinflated lungs. Left apical surgical clip. Patient is rotated slightly to the right. No focal consolidations. No pleural effusion or pneumothorax. The heart size and mediastinal contours are within normal limits. No acute  osseous abnormality. IMPRESSION: Hyperinflated lungs, which can be seen in the setting of COPD. No focal consolidations. Electronically Signed   By: Limin  Xu M.D.   On: 11/22/2023 12:06     .Critical Care  Performed by: Neysa Caron PARAS, DO Authorized by: Neysa Caron PARAS, DO   Critical care provider statement:    Critical care time (minutes):  30   Critical care was necessary to treat or prevent imminent or life-threatening deterioration of the following conditions:  Respiratory failure   Critical care was time spent personally by me on the following activities:  Development of treatment plan with patient or surrogate, discussions with consultants, evaluation of patient's response to treatment, examination of patient, ordering and review of laboratory studies, ordering and review of radiographic studies, ordering and performing treatments and interventions, pulse oximetry, re-evaluation of patient's condition and review of old charts    Medications Ordered in the ED  ipratropium-albuterol  (DUONEB) 0.5-2.5 (3) MG/3ML nebulizer solution 3 mL (has no administration in time range)  albuterol  (PROVENTIL ) (2.5 MG/3ML) 0.083% nebulizer solution (10 mg/hr Nebulization Given 11/22/23 1151)  ipratropium (ATROVENT ) nebulizer solution 0.5 mg (0.5 mg Nebulization Given 11/22/23 1151)  methylPREDNISolone  sodium succinate (SOLU-MEDROL ) 125 mg/2 mL injection 125 mg (125 mg Intravenous Given 11/22/23 1207)  LORazepam  (ATIVAN ) injection 0.5 mg (0.5 mg Intravenous Given 11/22/23 1227)  magnesium  sulfate IVPB 2 g 50 mL (2 g Intravenous New Bag/Given 11/22/23 1227)    Clinical Course as of 11/22/23 1343  Thu Nov 22, 2023  1140 Called to bedside to evaluate patient by nurse.  Was at doctor's appointment noted to be hypoxic.  History of COPD not on home oxygen.  Patient notes shortness of breath started last night, however patient's wife stated he has been complaining of shortness of breath x 3 days.  Increased sputum  production.  No improvement with Breo breathing treatments at home.  He is in moderate to severe respiratory distress, was saturating in the upper 70s on room air.  Placed on nonrebreather with improvement of oxygen saturation to the upper 90s.  He has poor air movement with diffuse wheezing.  Will treat with hour-long breathing treatment and Solu-Medrol .  Will get basic labs, chest x-ray and for likely admission for acute hypoxic respiratory failure [TY]  1209 Breathing treatment ongoing.  Continues to have increased work of breathing, although improved compared to my initial assessment.  Will continue to reevaluate [TY]  1217 Anxious with mask on breathing treatment ongoing.  Will give small dose of anxiolytic [TY]  1342 Discussed case with hospitalist for admission. [TY]    Clinical Course User Index [TY] Neysa Caron PARAS, DO  Medical Decision Making 68 year old male presenting emergency department for respiratory distress.  History of COPD not on oxygen.  Has had worsening shortness of breath, cough with increased phlegm production for the past 3 days.  Was getting presurgical testing done when was noted to be hypoxic down to 70%.  Was then moderate to severe respiratory distress on arrival with poor air movement diffuse wheezing, increased work of breathing, speaking in short sentences.  History and exam consistent with COPD exacerbation.  Oxygen level improved with oxygenation.  Was given Solu-Medrol , hour-long breathing treatment and 2 g of magnesium .  He was quite anxious and did require a small dose of Ativan  for anxiolysis.  His breathing has significantly improved, although not at baseline.  Chest x-ray without pneumonia, pneumothorax.  No significant metabolic derangements.  Appears to be near baseline kidney function.  Does have a leukocytosis of 22.3.  Given patient's new oxygen requirement will admit for acute hypoxic respiratory failure secondary to COPD  exacerbation.  Amount and/or Complexity of Data Reviewed Independent Historian:     Details: Spouse provided further history as noted in ED course. External Data Reviewed:     Details: Patient was to have TURP procedure tomorrow per urology notes and Per last pulmonary note, on Breo Labs: ordered. Decision-making details documented in ED Course. Radiology: ordered and independent interpretation performed.    Details: No pneumonia pneumothorax ECG/medicine tests: ordered and independent interpretation performed.    Details: No ST segment changes to indicate ischemia. Discussion of management or test interpretation with external provider(s): Discussed with hospitalist  Risk Prescription drug management. Decision regarding hospitalization. Diagnosis or treatment significantly limited by social determinants of health. Risk Details: Poor health literacy      Final diagnoses:  COPD exacerbation [facility])    ED Discharge Orders     None          Neysa Caron PARAS, DO 11/22/23 1248    Neysa Caron PARAS, DO 11/22/23 1343

## 2023-11-22 NOTE — Progress Notes (Signed)
 Pt. Came for PST appointment,he was strugling with his breathing,O2 sat's on the 80's. HR: 114.T: 97.6 BP: 137/66. Burnard Senna PA assessed the pt. Pt. Was brought to the ED for further evaluation.

## 2023-11-22 NOTE — Plan of Care (Signed)
  Problem: Education: Goal: Knowledge of disease or condition will improve Outcome: Not Progressing Goal: Knowledge of the prescribed therapeutic regimen will improve Outcome: Not Progressing Goal: Individualized Educational Video(s) Outcome: Not Progressing   Problem: Activity: Goal: Ability to tolerate increased activity will improve Outcome: Not Progressing Goal: Will verbalize the importance of balancing activity with adequate rest periods Outcome: Not Progressing   Problem: Respiratory: Goal: Ability to maintain a clear airway will improve Outcome: Not Progressing Goal: Levels of oxygenation will improve Outcome: Not Progressing Goal: Ability to maintain adequate ventilation will improve Outcome: Not Progressing   

## 2023-11-22 NOTE — Consult Note (Addendum)
 NAME:  Harry Young, MRN:  [id number], DOB:  April 10, 1956, LOS: 0 ADMISSION DATE:  11/22/2023, CONSULTATION DATE: 7/10 REFERRING MD: Jonell, CHIEF COMPLAINT: Respiratory failure  History of Present Illness:  68 year old male patient followed previously in our office, with known history of COPD and active tobacco abuse.  He is currently being treated by urology with plan for bladder surgery initially scheduled for 7/11 for his known bladder cancer.  Presented to presurgical testing on 7/10 with plan for TURBT to be done by Dr. Christopher on the 11th.  Noting 3 to 4 days of worsening cough with associated shortness of breath he appeared pale on presentation he was tachycardic room air saturations were 86% and he was subsequently referred to the emergency room for further evaluation.  On arrival from preanesthesia to the ER pulse oximetry is in the 70s to 80s breathing in a tripod position placed on 15 L with improvement in saturation And subsequently started on noninvasive positive pressure ventilation.  Initial evaluation in the emergency revealed the following: Venous blood gas with pH of 7.13 pCO2 of 86 pO2 of 51 HCO3 29 serum creatinine 1.32 this is around his baseline white blood cell count 22.3.  Portable chest x-ray was clear without focal infiltrates, was hyper distended  Critical care asked to evaluate.   Pertinent  Medical History  Tobacco abuse, emphysema/COPD, bladder cancer, left upper lobe spiculated lung mass, hearing loss, bilateral carotid artery stenosis, coronary artery disease, hypertension, hypothyroidism, mixed hyperlipidemia  Significant Hospital Events: Including procedures, antibiotic start and stop dates in addition to other pertinent events   7/10 presented for preoperative testing, was in acute distress rapidly requiring escalation of noninvasive ventilatory support.  Chest x-ray clear profoundly hypoxic directly admitted to the intensive care treating for AECOPD as well as  potential for acute pulmonary emboli  Interim History / Subjective:  Labored lethargic marked accessory use  Objective    Blood pressure 103/79, pulse (!) 116, resp. rate (!) 24, height 5' 9 (1.753 m), weight 54.4 kg, SpO2 97%.    FiO2 (%):  [30 %] 30 % PEEP:  [5 cmH20] 5 cmH20 Pressure Support:  [10 cmH20-12 cmH20] (P) 12 cmH20  No intake or output data in the 24 hours ending 11/22/23 1447 Filed Weights   11/22/23 1138  Weight: 54.4 kg    Examination: General: Cachectic 68 year old male appears much older than stated age currently on noninvasive positive pressure ventilation with marked increased work of breathing barely able to lift his head off the stretcher HENT: Temporal wasting no JVD noninvasive positive pressure mask in place Lungs: Minimal air movement marked accessory use with diaphragmatic efforts Cardiovascular: Tachycardic rhythm without murmur rub or gallop Abdomen: Soft not tender Extremities: Warm dry Neuro: Barely arousable, able to speak in 1 and 2 word phrases, no focal deficits GU: Due to void  Resolved problem list   Assessment and Plan   Acute hypoxic and hypercarbic respiratory failure.  Clinically sounds consistent with acute COPD exacerbation however need to rule out acute thromboembolic event especially in the context of known malignancy Plan Admitting to the intensive care Have already spoken with his wife, he is a full code, anticipate intubation on arrival to the ICU we are expediting placement Starting empiric IV heparin , he is not stable enough at this point to go to get CT scan, if hemodynamics allow we can do this following intubation Full ventilator support post intubation, with follow-up ABG and chest x-ray Scheduled bronchodilators Systemic steroids Send urine  strep and Legionella antigen as well as respiratory viral panel Start IV azithromycin  and ceftriaxone  for AECOPD PAD protocol RASS goal -2, may need him be more aggressive with this  depending on ventilator mechanics VAP bundle A.m. chest x-ray  Undifferentiated shock with systemic inflammatory response, certainly could be sepsis from AECOPD, but also concurrent about pulmonary emboli Plan Ensure 30 mL/kg crystalloid bolus Check serial lactates Empiric antibiotics Norepinephrine  for mean arterial pressure greater than 65 Epinephrine  on standby postintubation Hold any and all antihypertensives If CT angiogram suggest pulmonary emboli will need ECHO I do not see absolute contraindication to thrombolytics if PE  Acute metabolic encephalopathy.  Almost certainly secondary to his progressive hypercarbia however did receive a low-dose of benzodiazepines Plan Supportive care PAD protocol as above  CKD. 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 He was pending TURBT, in the setting of recurrence Plan Surgery canceled Follow-up urology timing to be determined  Severe protein calorie malnutrition in the setting of malignancy Plan RD consultation  Best Practice (right click and Reselect all SmartList Selections daily)   Diet/type: tubefeeds DVT prophylaxis systemic heparin  Pressure ulcer(s): N/A GI prophylaxis: PPI Lines: N/A Foley:  Yes, and it is still needed Code Status:  full code Last date of multidisciplinary goals of care discussion [discussed full code]  Labs   CBC: Recent Labs  Lab 11/22/23 1148  WBC 22.3*  HGB 15.4  HCT 47.4  MCV 98.3  PLT 221    Basic Metabolic Panel: Recent Labs  Lab 11/22/23 1148  NA 139  K 4.3  CL 101  CO2 26  GLUCOSE 152*  BUN 18  CREATININE 1.32*  CALCIUM 9.6   GFR: Estimated Creatinine Clearance: 40.6 mL/min (A) (by C-G formula based on SCr of 1.32 mg/dL (H)). Recent Labs  Lab 11/22/23 1148  WBC 22.3*    Liver Function  Tests: Recent Labs  Lab 11/22/23 1148  AST 22  ALT 19  ALKPHOS 108  BILITOT 1.9*  PROT 8.3*  ALBUMIN 4.7   No results for input(s): LIPASE, AMYLASE in the last 168 hours. No results for input(s): AMMONIA in the last 168 hours.  ABG    Component Value Date/Time   HCO3 28.6 (H) 11/22/2023 1421   TCO2 30 01/12/2013 2121   ACIDBASEDEF 2.9 (H) 11/22/2023 1421   O2SAT 77.7 11/22/2023 1421     Coagulation Profile: No results for input(s): INR, PROTIME in the last 168 hours.  Cardiac Enzymes: No results for input(s): CKTOTAL, CKMB, CKMBINDEX, TROPONINI in the last 168 hours.  HbA1C: No results found for: HGBA1C  CBG: No results for input(s): GLUCAP in the last 168 hours.  Review of Systems:   Unable   Past Medical History:  He,  has a past medical history of Anxiety disorder, unspecified, Arthritis, Asthma, Asymmetrical hearing loss of left ear (01/03/2018), Bilateral carotid artery stenosis (08/07/2015), Complication of anesthesia, Constipation (08/07/2015), COPD (chronic obstructive pulmonary disease) (HCC), Coronary artery disease involving native coronary artery of native heart without angina pectoris (04/03/2019), Displacement of lumbar intervertebral disc without myelopathy (04/14/2013), Dyspnea, Essential (primary) hypertension (04/03/2019), Golfer's elbow, right (03/30/2016), Heart murmur, History of kidney removal, History of kidney stones, Hyperlipidemia (08/07/2015), Hypothyroidism due to Hashimoto's thyroiditis (07/15/2015), Leg weakness (08/07/2015), Lumbar radiculopathy (10/21/2013), Nocturia, Nonrheumatic aortic valve insufficiency (02/22/2019), Nonrheumatic aortic valve stenosis (02/22/2019), Spinal stenosis of lumbar  region (05/19/2015), Transitional cell carcinoma of bladder (HCC) (UROLOGIST--  DR OTTELIN), and Vitamin D deficiency.   Surgical History:   Past Surgical History:  Procedure Laterality Date   BRONCHIAL BIOPSY  01/09/2022    Procedure: BRONCHIAL BIOPSIES;  Surgeon: Gladis Leonor HERO, MD;  Location: [facility] ENDOSCOPY;  Service: Pulmonary;;   BRONCHIAL BRUSHINGS  01/09/2022   Procedure: BRONCHIAL BRUSHINGS;  Surgeon: Gladis Leonor HERO, MD;  Location: [facility] ENDOSCOPY;  Service: Pulmonary;;   BRONCHIAL NEEDLE ASPIRATION BIOPSY  01/09/2022   Procedure: BRONCHIAL NEEDLE ASPIRATION BIOPSIES;  Surgeon: Gladis Leonor HERO, MD;  Location: [facility] ENDOSCOPY;  Service: Pulmonary;;   CYSTOSCOPY  05/10/2022   Procedure: CYSTOSCOPY WITH STENT REMOVAL;  Surgeon: Alvaro Hummer, MD;  Location: [facility] ORS;  Service: Urology;;   PHYLLIS W/ RETROGRADES Bilateral 12/13/2012   Procedure: CYSTOSCOPY WITH RETROGRADE PYELOGRAM;  Surgeon: Mark C Ottelin, MD;  Location: [facility];  Service: Urology;  Laterality: Bilateral;   CYSTOSCOPY W/ RETROGRADES Right 04/27/2023   Procedure: CYSTOSCOPY WITH RIGHT RETROGRADE PYELOGRAM;  Surgeon: Alvaro Hummer KATHEE Mickey., MD;  Location: [facility];  Service: Urology;  Laterality: Right;  60 MINUTES NEEDED FOR CASE   CYSTOSCOPY WITH LITHOLAPAXY N/A 04/04/2013   Procedure: CYSTOSCOPY ;  Surgeon: Oneil JAYSON Rafter, MD;  Location: [facility];  Service: Urology;  Laterality: N/A;   CYSTOSCOPY WITH RETROGRADE PYELOGRAM, URETEROSCOPY AND STENT PLACEMENT Bilateral 02/28/2022   Procedure: CYSTOSCOPY WITH RETROGRADE PYELOGRAM, URETEROSCOPY, LEFT STENT PLACEMENT;  Surgeon: Rosalind Zachary KATHEE, MD;  Location: [facility];  Service: Urology;  Laterality: Bilateral;  1 HR   LUMBAR DISC SURGERY  09/20/2005   L2 -- L5   LUMBAR LAMINECTOMY/DECOMPRESSION MICRODISCECTOMY Left 01/09/2013   Procedure: Left  lumbar four-five microdiskectomy;  Surgeon: Rockey LITTIE Peru, MD;  Location: [facility];  Service: Neurosurgery;  Laterality: Left;   LUMBAR WOUND DEBRIDEMENT N/A 01/13/2013   Procedure: LUMBAR WOUND DEBRIDEMENT;  Surgeon: Victory DELENA Gunnels, MD;  Location: [facility];  Service: Neurosurgery;  Laterality: N/A;  Lumbar Wound  Exploration,  Attempted Placement of Cerebrospinal Drain, and Repair of CSF Leak   ROBOT ASSITED LAPAROSCOPIC NEPHROURETERECTOMY Left 05/10/2022   Procedure: XI ROBOT ASSITED LAPAROSCOPIC NEPHROURETERECTOMY;  Surgeon: Alvaro Hummer, MD;  Location: [facility];  Service: Urology;  Laterality: Left;   TOOTH EXTRACTION     TRANSURETHRAL RESECTION OF BLADDER TUMOR  07-05-2009;   11-22-2009   bladder cancer   TRANSURETHRAL RESECTION OF BLADDER TUMOR N/A 12/13/2012   Procedure: TRANSURETHRAL RESECTION OF BLADDER TUMOR (TURBT) AND INSTILLATION OF MYTOMYCIN;  Surgeon: Mark C Ottelin, MD;  Location: [facility];  Service: Urology;  Laterality: N/A;   TRANSURETHRAL RESECTION OF BLADDER TUMOR N/A 04/27/2023   Procedure: TRANSURETHRAL RESECTION OF BLADDER TUMOR (TURBT);  Surgeon: Alvaro Hummer KATHEE Mickey., MD;  Location: [facility];  Service: Urology;  Laterality: N/A;   TRANSURETHRAL RESECTION OF BLADDER TUMOR WITH GYRUS (TURBT-GYRUS) N/A 04/04/2013   Procedure: TRANSURETHRAL RESECTION OF BLADDER TUMOR WITH GYRUS (TURBT-GYRUS);  Surgeon: Mark C Ottelin, MD;  Location: [facility];  Service: Urology;  Laterality: N/A;     Social History:   reports that he has been smoking cigarettes. He started smoking about 55 years ago. He has a 55.4 pack-year smoking history. He has never used smokeless tobacco. He reports that he does not drink alcohol and does not use drugs.   Family History:  His family history includes COPD in his father; Diabetes in his brother; Heart disease in his  father; Heart failure in his father; Hyperlipidemia in his mother.   Allergies Allergies  Allergen Reactions   Crestor [Rosuvastatin] Other (See Comments)    Urinary retention.   Sulfa Antibiotics Rash     Home Medications  Prior to Admission medications   Medication Sig Start Date End Date Taking? Authorizing Provider  albuterol  (VENTOLIN  HFA) 108 (90 Base) MCG/ACT inhaler Inhale 1-2 puffs into the lungs  every 6 (six) hours as needed for shortness of breath or wheezing. 12/13/21   [provider]  amLODipine  (NORVASC ) 5 MG tablet TAKE 1 TABLET BY MOUTH EVERY DAY Patient taking differently: Take 2.5 mg by mouth daily. 06/28/20   Patwardhan, Newman PARAS, MD  BREO ELLIPTA  200-25 MCG/ACT AEPB Inhale 1 puff into the lungs daily. 04/14/22   [provider]  Calcium-Magnesium -Zinc (CAL-MAG-ZINC PO) Take 1 tablet by mouth daily.    [provider]  Cholecalciferol (VITAMIN D3) 25 MCG (1000 UT) CAPS Take 1,000 Units by mouth daily with supper.    [provider]  Coenzyme Q10 (CO Q 10 PO) Take 200 mg by mouth daily.    [provider]  ezetimibe (ZETIA) 10 MG tablet Take 10 mg by mouth daily. 06/18/18   [provider]  Homeopathic Products (RESTFUL LEGS SL) Place 1 tablet under the tongue at bedtime as needed (restless legs).    [provider]  HYDROcodone -acetaminophen  (NORCO) 10-325 MG tablet Take 1 tablet by mouth 2 (two) times daily as needed for moderate pain (pain score 4-6).    [provider]  levothyroxine  (SYNTHROID ) 75 MCG tablet Take 75 mcg by mouth daily before breakfast.    [provider]  Omega-3 Fatty Acids (FISH OIL) 500 MG CAPS Take 500 mg by mouth daily.    [provider]  simvastatin (ZOCOR) 5 MG tablet Take 5 mg by mouth once a week. 03/15/22   [provider]     Critical care time: 43 min

## 2023-11-22 NOTE — Progress Notes (Signed)
 Transported pt from [facility] ED to [facility] ICU uneventful.

## 2023-11-23 ENCOUNTER — Ambulatory Visit: Admission: RE | Admit: 2023-11-23 | Source: Ambulatory Visit | Admitting: Urology

## 2023-11-23 ENCOUNTER — Encounter: Admission: RE | Payer: Self-pay | Source: Ambulatory Visit

## 2023-11-23 ENCOUNTER — Inpatient Hospital Stay (HOSPITAL_COMMUNITY)

## 2023-11-23 ENCOUNTER — Encounter (HOSPITAL_COMMUNITY): Payer: Self-pay | Admitting: Physician Assistant

## 2023-11-23 DIAGNOSIS — A419 Sepsis, unspecified organism: Secondary | ICD-10-CM | POA: Diagnosis not present

## 2023-11-23 DIAGNOSIS — J9601 Acute respiratory failure with hypoxia: Secondary | ICD-10-CM | POA: Diagnosis not present

## 2023-11-23 DIAGNOSIS — J9602 Acute respiratory failure with hypercapnia: Secondary | ICD-10-CM | POA: Diagnosis not present

## 2023-11-23 DIAGNOSIS — J441 Chronic obstructive pulmonary disease with (acute) exacerbation: Secondary | ICD-10-CM | POA: Diagnosis not present

## 2023-11-23 DIAGNOSIS — R6521 Severe sepsis with septic shock: Secondary | ICD-10-CM

## 2023-11-23 LAB — BLOOD GAS, ARTERIAL
Acid-base deficit: 1.9 mmol/L (ref 0.0–2.0)
Bicarbonate: 24.3 mmol/L (ref 20.0–28.0)
Drawn by: 732101
FIO2: 50 %
MECHVT: 560 mL
O2 Saturation: 99.3 %
PEEP: 5 cmH2O
Patient temperature: 36.2
RATE: 20 {breaths}/min
pCO2 arterial: 44 mmHg (ref 32–48)
pH, Arterial: 7.34 — ABNORMAL LOW (ref 7.35–7.45)
pO2, Arterial: 129 mmHg — ABNORMAL HIGH (ref 83–108)

## 2023-11-23 LAB — URINALYSIS, ROUTINE W REFLEX MICROSCOPIC
Bacteria, UA: NONE SEEN
Bilirubin Urine: NEGATIVE
Glucose, UA: NEGATIVE mg/dL
Ketones, ur: NEGATIVE mg/dL
Leukocytes,Ua: NEGATIVE
Nitrite: NEGATIVE
Protein, ur: 100 mg/dL — AB
RBC / HPF: >50 RBC/hpf (ref 0–5)
Specific Gravity, Urine: <1.005 — ABNORMAL LOW (ref 1.005–1.030)
pH: 5 (ref 5.0–8.0)

## 2023-11-23 LAB — RESPIRATORY PANEL BY PCR

## 2023-11-23 LAB — STREP PNEUMONIAE URINARY ANTIGEN: Strep Pneumo Urinary Antigen: NEGATIVE

## 2023-11-23 LAB — CBC
HCT: 38.8 % — ABNORMAL LOW (ref 39.0–52.0)
Hemoglobin: 12.9 g/dL — ABNORMAL LOW (ref 13.0–17.0)
MCH: 32.2 pg (ref 26.0–34.0)
MCHC: 33.2 g/dL (ref 30.0–36.0)
MCV: 96.8 fL (ref 80.0–100.0)
Platelets: 194 K/uL (ref 150–400)
RBC: 4.01 MIL/uL — ABNORMAL LOW (ref 4.22–5.81)
RDW: 14.1 % (ref 11.5–15.5)
WBC: 17.9 K/uL — ABNORMAL HIGH (ref 4.0–10.5)
nRBC: 0 % (ref 0.0–0.2)

## 2023-11-23 LAB — PHOSPHORUS
Phosphorus: <1 mg/dL — CL (ref 2.5–4.6)
Phosphorus: 1.7 mg/dL — ABNORMAL LOW (ref 2.5–4.6)
Phosphorus: 4 mg/dL (ref 2.5–4.6)

## 2023-11-23 LAB — MAGNESIUM
Magnesium: 2.4 mg/dL (ref 1.7–2.4)
Magnesium: 2.3 mg/dL (ref 1.7–2.4)
Magnesium: 2.2 mg/dL (ref 1.7–2.4)

## 2023-11-23 LAB — LACTIC ACID, PLASMA: Lactic Acid, Venous: 1.4 mmol/L (ref 0.5–1.9)

## 2023-11-23 LAB — PROCALCITONIN: Procalcitonin: 6.7 ng/mL

## 2023-11-23 LAB — HEMOGLOBIN A1C
Hgb A1c MFr Bld: 5.3 % (ref 4.8–5.6)
Mean Plasma Glucose: 105 mg/dL

## 2023-11-23 LAB — COMPREHENSIVE METABOLIC PANEL WITH GFR
ALT: 15 U/L (ref 0–44)
AST: 19 U/L (ref 15–41)
Albumin: 3.5 g/dL (ref 3.5–5.0)
Alkaline Phosphatase: 84 U/L (ref 38–126)
Anion gap: 9 (ref 5–15)
BUN: 31 mg/dL — ABNORMAL HIGH (ref 8–23)
CO2: 25 mmol/L (ref 22–32)
Calcium: 9.1 mg/dL (ref 8.9–10.3)
Chloride: 101 mmol/L (ref 98–111)
Creatinine, Ser: 1.66 mg/dL — ABNORMAL HIGH (ref 0.61–1.24)
GFR, Estimated: 44 mL/min — ABNORMAL LOW (ref >60)
Glucose, Bld: 260 mg/dL — ABNORMAL HIGH (ref 70–99)
Potassium: 3.4 mmol/L — ABNORMAL LOW (ref 3.5–5.1)
Sodium: 135 mmol/L (ref 135–145)
Total Bilirubin: 0.9 mg/dL (ref 0.0–1.2)
Total Protein: 6.5 g/dL (ref 6.5–8.1)

## 2023-11-23 LAB — HIV ANTIBODY (ROUTINE TESTING W REFLEX): HIV Screen 4th Generation wRfx: NONREACTIVE

## 2023-11-23 LAB — GLUCOSE, CAPILLARY
Glucose-Capillary: 168 mg/dL — ABNORMAL HIGH (ref 70–99)
Glucose-Capillary: 167 mg/dL — ABNORMAL HIGH (ref 70–99)

## 2023-11-23 LAB — TROPONIN I (HIGH SENSITIVITY): Troponin I (High Sensitivity): 20 ng/L — ABNORMAL HIGH (ref <18)

## 2023-11-23 LAB — HEPARIN LEVEL (UNFRACTIONATED): Heparin Unfractionated: 0.79 [IU]/mL — ABNORMAL HIGH (ref 0.30–0.70)

## 2023-11-23 SURGERY — TURBT (TRANSURETHRAL RESECTION OF BLADDER TUMOR)
Anesthesia: General | Laterality: Right

## 2023-11-23 MED ORDER — THROMBI-PAD 3"X3" EX PADS: 1.0000 | MEDICATED_PAD | Freq: Once | CUTANEOUS | Status: DC

## 2023-11-23 MED ORDER — REVEFENACIN 175 MCG/3ML IN SOLN
175.0000 ug | Freq: Every day | RESPIRATORY_TRACT | Status: DC
  Administered 2023-11-23 – 2023-11-27 (×5): 175 ug via RESPIRATORY_TRACT
  Filled 2023-11-23 (×5): qty 3

## 2023-11-23 MED ORDER — VITAL AF 1.2 CAL PO LIQD
1000.0000 mL | ORAL | Status: DC
  Administered 2023-11-23 – 2023-11-24 (×2): 1000 mL

## 2023-11-23 MED ORDER — INSULIN ASPART 100 UNIT/ML IJ SOLN
0.0000 [IU] | INTRAMUSCULAR | Status: DC
  Administered 2023-11-23 – 2023-11-24 (×9): 2 [IU] via SUBCUTANEOUS
  Administered 2023-11-25 (×2): 1 [IU] via SUBCUTANEOUS
  Administered 2023-11-25: 2 [IU] via SUBCUTANEOUS
  Administered 2023-11-26 (×2): 1 [IU] via SUBCUTANEOUS
  Administered 2023-11-26: 2 [IU] via SUBCUTANEOUS
  Administered 2023-11-26 – 2023-11-27 (×3): 1 [IU] via SUBCUTANEOUS
  Administered 2023-11-27 – 2023-11-28 (×2): 2 [IU] via SUBCUTANEOUS
  Administered 2023-11-28: 9 [IU] via SUBCUTANEOUS

## 2023-11-23 MED ORDER — THIAMINE MONONITRATE 100 MG PO TABS
100.0000 mg | ORAL_TABLET | Freq: Every day | ORAL | Status: DC
  Administered 2023-11-23 – 2023-11-25 (×2): 100 mg
  Filled 2023-11-23 (×2): qty 1

## 2023-11-23 MED ORDER — ORAL CARE MOUTH RINSE
15.0000 mL | OROMUCOSAL | Status: DC
  Administered 2023-11-23 – 2023-11-24 (×17): 15 mL via OROMUCOSAL

## 2023-11-23 MED ORDER — ORAL CARE MOUTH RINSE: 15.0000 mL | OROMUCOSAL | Status: DC | PRN

## 2023-11-23 MED ORDER — PROSOURCE TF20 ENFIT COMPATIBL EN LIQD
60.0000 mL | Freq: Every day | ENTERAL | Status: DC
  Administered 2023-11-23: 60 mL
  Filled 2023-11-23: qty 60

## 2023-11-23 MED ORDER — VITAL HP 1.0 CAL PO LIQD: 1000.0000 mL | ORAL | Status: DC

## 2023-11-23 MED ORDER — OXIDIZED CELLULOSE EX PADS
1.0000 | MEDICATED_PAD | Freq: Once | CUTANEOUS | Status: DC
  Filled 2023-11-23: qty 1

## 2023-11-23 MED ORDER — POTASSIUM & SODIUM PHOSPHATES 280-160-250 MG PO PACK
2.0000 | PACK | Freq: Once | ORAL | Status: AC
  Administered 2023-11-23: 2
  Filled 2023-11-23: qty 2

## 2023-11-23 MED ORDER — HEPARIN SODIUM (PORCINE) 5000 UNIT/ML IJ SOLN
5000.0000 [IU] | Freq: Three times a day (TID) | INTRAMUSCULAR | Status: DC
  Administered 2023-11-23 – 2023-11-28 (×16): 5000 [IU] via SUBCUTANEOUS
  Filled 2023-11-23 (×16): qty 1

## 2023-11-23 MED ORDER — ARFORMOTEROL TARTRATE 15 MCG/2ML IN NEBU
15.0000 ug | INHALATION_SOLUTION | Freq: Two times a day (BID) | RESPIRATORY_TRACT | Status: DC
  Administered 2023-11-23 – 2023-11-27 (×9): 15 ug via RESPIRATORY_TRACT
  Filled 2023-11-23 (×9): qty 2

## 2023-11-23 MED ORDER — POTASSIUM PHOSPHATES 15 MMOLE/5ML IV SOLN
30.0000 mmol | Freq: Once | INTRAVENOUS | Status: AC
  Administered 2023-11-23: 30 mmol via INTRAVENOUS
  Filled 2023-11-23: qty 10

## 2023-11-23 NOTE — Progress Notes (Signed)
 eLink Physician-Brief Progress Note Patient Name: Harry Young DOB: 1956-01-30 MRN: [id number]   Date of Service  11/23/2023  HPI/Events of Note  K+ 3.4, Phos < 1.0, Cr 1.66  eICU Interventions  K+Phos 30 mmol iv x 1 ordered. KUB ordered to confirm OG tube position.        Harry Young U [name] 11/23/2023, 6:38 AM

## 2023-11-23 NOTE — Plan of Care (Signed)
  Problem: Education: Goal: Knowledge of disease or condition will improve Outcome: Progressing   Problem: Activity: Goal: Ability to tolerate increased activity will improve Outcome: Progressing   Problem: Respiratory: Goal: Ability to maintain a clear airway will improve Outcome: Progressing Goal: Levels of oxygenation will improve Outcome: Progressing Goal: Ability to maintain adequate ventilation will improve Outcome: Progressing   

## 2023-11-23 NOTE — Progress Notes (Signed)
 Initial Nutrition Assessment  DOCUMENTATION CODES:   Severe malnutrition in context of chronic illness  INTERVENTION:  - Initiate tube feeding via OGT: Vital 1.2 at 60 ml/h (1440 ml per day) *Start at 71mL/hr and advance by 10mL Q24H Provides 1728 kcal, 108 gm protein, 1168 ml free water  daily  - Monitor magnesium , potassium, and phosphorus BID for at least 3 days, MD to replete as needed, as pt is at risk for refeeding syndrome given severe malnutrition.  - Add 100mg  thiamine  x5 days.  - FWF per CCM.   - Monitor weight trends.   NUTRITION DIAGNOSIS:   Severe Malnutrition related to chronic illness (COPD) as evidenced by severe fat depletion, severe muscle depletion.  GOAL:   Patient will meet greater than or equal to 90% of their needs  MONITOR:   Vent status, Labs, Weight trends, TF tolerance  REASON FOR ASSESSMENT:   Consult Enteral/tube feeding initiation and management  ASSESSMENT:   68 y.o. male with PMH significant of anxiety, osteoarthritis, COPD, bilateral carotid stenosis, constipation, angina, essential hypertension, transitional cell bladder cancer, urothelial carcinoma with left kidney nephrectomy, hypothyroidism, spinal stenosis of lumbar spine, nonrheumatic aortic valve stenosis, vitamin D deficiency who presented with dyspnea and hypoxia. Admitted for acute COPD exacerbation.  7/10 Admit; Intubated  Patient is currently intubated on ventilator support MV: 11.4 L/min Temp (24hrs), Avg:97.1 F (36.2 C), Min:95.7 F (35.4 C), Max:99 F (37.2 C)  No family at bedside. Patient awake on vent, shook head no when asked if he had been eating well PTA.   Per chart review, weight appears stable since at least December.  OGT in place, xray verified in the stomach. Per discussion with CCM, can initiate tube feeds and begin advancing towards goal.  Suspect patient at high risk for refeeding syndrome given severe muscle/fat wasting and low electrolytes. Will  plan to advance slowly, Q24H, and add thiamine . Discussed plan with RN.   Medications reviewed and include: Colace, Miralax  Precedex  Fentanyl  Levophed  @ 9 mcg/min  Labs reviewed:   Latest Reference Range & Units 11/23/23 04:51 11/23/23 08:23  Potassium 3.5 - 5.1 mmol/L 3.4 (L)   Creatinine 0.61 - 1.24 mg/dL 8.33 (H)   Phosphorus 2.5 - 4.6 mg/dL <8.9 (LL) 1.7 (L)  Magnesium  1.7 - 2.4 mg/dL 2.4 2.3  (LL): Data is critically low (L): Data is abnormally low (H): Data is abnormally high   NUTRITION - FOCUSED PHYSICAL EXAM:  Flowsheet Row Most Recent Value  Orbital Region Moderate depletion  Upper Arm Region Severe depletion  Thoracic and Lumbar Region Severe depletion  Buccal Region Severe depletion  Temple Region Severe depletion  Clavicle Bone Region Severe depletion  Clavicle and Acromion Bone Region Severe depletion  Scapular Bone Region Unable to assess  Dorsal Hand Severe depletion  Patellar Region Severe depletion  Anterior Thigh Region Severe depletion  Posterior Calf Region Severe depletion  Edema (RD Assessment) None  Hair Reviewed  Eyes Reviewed  Mouth Unable to assess  Skin Reviewed  Nails Reviewed    Diet Order:   Diet Order             Diet NPO time specified  Diet effective now                   EDUCATION NEEDS:  Education needs have been addressed  Skin:  Skin Assessment: Reviewed RN Assessment  Last BM:  PTA  Height:  Ht Readings from Last 1 Encounters:  11/23/23 5' 9 (1.753 m)  Weight:  Wt Readings from Last 1 Encounters:  11/23/23 56.3 kg   Ideal Body Weight:  72.73 kg  BMI:  Body mass index is 18.33 kg/m.  Estimated Nutritional Needs:  Kcal:  1600-1800 kcals Protein:  85-115 grams Fluid:  >/= 1.6L    Trude Ned RD, LDN Contact via Secure Chat.

## 2023-11-23 NOTE — TOC Initial Note (Signed)
 Transition of Care [facility]) - Initial/Assessment Note    Patient Details  Name: Harry Young MRN: [id number] Date of Birth: Dec 31, 1955  [facility] CM/SW Contact:    Bascom Service, RN Phone Number: 11/23/2023, 2:03 PM  Clinical Narrative:  d/c plan TBD. Intubated. DNR. ordered for TF. Continue to monitor.                 Expected Discharge Plan:  (TBD) Barriers to Discharge: Continued Medical Work up   Patient Goals and CMS Choice   CMS Medicare.gov Compare Post Acute Care list provided to:: Patient Represenative (must comment) (spouse) Choice offered to / list presented to : Spouse Bettles ownership interest in [facility].provided to:: Spouse    Expected Discharge Plan and Services                                              Prior Living Arrangements/Services                       Activities of Daily Living      Permission Sought/Granted                  Emotional Assessment              Admission diagnosis:  Respiratory failure (HCC) [J96.90] COPD exacerbation (HCC) [J44.1] COPD with acute exacerbation (HCC) [J44.1] Patient Active Problem List   Diagnosis Date Noted   COPD with acute exacerbation (HCC) 11/22/2023   Respiratory failure (HCC) 11/22/2023   Acute respiratory failure with hypoxia and hypercapnia (HCC) 11/22/2023   Urothelial carcinoma of kidney, left (HCC) 05/10/2022   Pulmonary nodule 01/09/2022   Abnormal electrocardiogram 11/28/2021   Anxiety disorder, unspecified 11/28/2021   Bladder stones 11/28/2021   COPD (chronic obstructive pulmonary disease) (HCC) 11/28/2021   History of kidney stones 11/28/2021   Hypothyroidism, unspecified 11/28/2021   Low back pain radiating to left leg 11/28/2021   Nocturia 11/28/2021   Other chronic pain 11/28/2021   Productive cough 11/28/2021   Smokers' cough (HCC) 11/28/2021   Transitional cell carcinoma of bladder (HCC) 11/28/2021   Urgency of  urination 11/28/2021   Vitamin D deficiency 11/28/2021   Mixed hyperlipidemia 07/28/2019   Essential (primary) hypertension 04/03/2019   Coronary artery disease involving native coronary artery of native heart without angina pectoris 04/03/2019   Exertional dyspnea 02/22/2019   Nonrheumatic aortic valve stenosis 02/22/2019   Nonrheumatic aortic valve insufficiency 02/22/2019   Asymmetrical hearing loss of left ear 01/03/2018   Smokes 01/03/2018   Golfer's elbow, right 03/30/2016   Bilateral carotid artery stenosis 08/07/2015   Constipation 08/07/2015   Increased urinary frequency 08/07/2015   Joint swelling 08/07/2015   Leg weakness 08/07/2015   Screen for colon cancer 08/07/2015   Neoplasm of bladder 08/07/2015   Hyperlipidemia 08/07/2015   Hypothyroidism due to Hashimoto's thyroiditis 07/15/2015   Spinal stenosis of lumbar region 05/19/2015   Lumbosacral neuritis 06/24/2014   Lumbar radiculopathy 10/21/2013   Displacement of lumbar intervertebral disc without myelopathy 04/14/2013   Lumbosacral radiculitis 04/14/2013   Bladder tumor 04/04/2013   PCP:  Corlis Pagan, NP Pharmacy:   [facility] [phone] [address],  - [address] AT [facility] [address] [place] [address] Phone: [phone] Fax: [phone]  Social Drivers of Health (SDOH) Social History: SDOH Screenings   Food Insecurity: Patient Unable To Answer (11/23/2023)  Housing: Patient Unable To Answer (11/23/2023)  Transportation Needs: Patient Unable To Answer (11/23/2023)  Utilities: Patient Unable To Answer (11/23/2023)  Social Connections: Patient Unable To Answer (11/23/2023)  Tobacco Use: High Risk (11/22/2023)   SDOH Interventions:     Readmission Risk Interventions     No data to display

## 2023-11-24 ENCOUNTER — Inpatient Hospital Stay (HOSPITAL_COMMUNITY)

## 2023-11-24 DIAGNOSIS — E43 Unspecified severe protein-calorie malnutrition: Secondary | ICD-10-CM | POA: Insufficient documentation

## 2023-11-24 DIAGNOSIS — J9601 Acute respiratory failure with hypoxia: Secondary | ICD-10-CM | POA: Diagnosis not present

## 2023-11-24 DIAGNOSIS — N1831 Chronic kidney disease, stage 3a: Secondary | ICD-10-CM

## 2023-11-24 DIAGNOSIS — J441 Chronic obstructive pulmonary disease with (acute) exacerbation: Secondary | ICD-10-CM | POA: Diagnosis not present

## 2023-11-24 DIAGNOSIS — J9602 Acute respiratory failure with hypercapnia: Secondary | ICD-10-CM | POA: Diagnosis not present

## 2023-11-24 DIAGNOSIS — R918 Other nonspecific abnormal finding of lung field: Secondary | ICD-10-CM

## 2023-11-24 LAB — CBC
HCT: 34.7 % — ABNORMAL LOW (ref 39.0–52.0)
Hemoglobin: 11.5 g/dL — ABNORMAL LOW (ref 13.0–17.0)
MCH: 32.2 pg (ref 26.0–34.0)
MCHC: 33.1 g/dL (ref 30.0–36.0)
MCV: 97.2 fL (ref 80.0–100.0)
Platelets: 150 K/uL (ref 150–400)
RBC: 3.57 MIL/uL — ABNORMAL LOW (ref 4.22–5.81)
RDW: 14.6 % (ref 11.5–15.5)
WBC: 19.4 K/uL — ABNORMAL HIGH (ref 4.0–10.5)
nRBC: 0 % (ref 0.0–0.2)

## 2023-11-24 LAB — PHOSPHORUS
Phosphorus: 4.1 mg/dL (ref 2.5–4.6)
Phosphorus: 4.9 mg/dL — ABNORMAL HIGH (ref 2.5–4.6)

## 2023-11-24 LAB — LEGIONELLA PNEUMOPHILA SEROGP 1 UR AG: L. pneumophila Serogp 1 Ur Ag: NEGATIVE

## 2023-11-24 LAB — MAGNESIUM
Magnesium: 2.2 mg/dL (ref 1.7–2.4)
Magnesium: 2.3 mg/dL (ref 1.7–2.4)

## 2023-11-24 LAB — PROCALCITONIN: Procalcitonin: 4.87 ng/mL

## 2023-11-24 MED ORDER — ORAL CARE MOUTH RINSE
15.0000 mL | OROMUCOSAL | Status: DC
  Administered 2023-11-25 – 2023-11-26 (×8): 15 mL via OROMUCOSAL

## 2023-11-24 MED ORDER — ORAL CARE MOUTH RINSE: 15.0000 mL | OROMUCOSAL | Status: DC | PRN

## 2023-11-24 NOTE — Progress Notes (Signed)
 NAME:  Harry Young, MRN:  [id number], DOB:  04/03/1956, LOS: 2 ADMISSION DATE:  11/22/2023, CONSULTATION DATE: 7/10 REFERRING MD: Harry Young, CHIEF COMPLAINT: Respiratory failure  History of Present Illness:  68 year old male patient followed previously in our office, with known history of COPD and active tobacco abuse.  He is currently being treated by urology with plan for bladder surgery initially scheduled for 7/11 for his known bladder cancer.  Presented to presurgical testing on 7/10 with plan for TURBT to be done by Dr. Christopher on the 11th.  Noting 3 to 4 days of worsening cough with associated shortness of breath he appeared pale on presentation, tachycardic, room air saturations were 86% and he was subsequently referred to the emergency room for further evaluation.  On arrival from preanesthesia to the ER pulse oximetry is in the 70s to 80s breathing in a tripod position placed on 15 L with improvement. He was subsequently started on NIPPV.  Initial evaluation in the emergency revealed the following: Venous blood gas with pH of 7.13 pCO2 of 86 pO2 of 51 HCO3 29 serum creatinine 1.32 this is around his baseline white blood cell count 22.3.  Portable chest x-ray was clear without focal infiltrates, was hyper distended  Critical care asked to evaluate.   Pertinent  Medical History  Tobacco abuse, emphysema/COPD, bladder cancer, left upper lobe spiculated lung mass, hearing loss, bilateral carotid artery stenosis, coronary artery disease, hypertension, hypothyroidism, mixed hyperlipidemia  Significant Hospital Events: Including procedures, antibiotic start and stop dates in addition to other pertinent events   7/10: pre-op>distress>ER, put on NIPPV. CXR no infiltrate> admit ICU for AECOPD/  Interim History / Subjective:  Harry Young denies complaints. He is frustrated that he cannot talk.  Objective    Blood pressure (!) 109/40, pulse (!) 48, temperature 99.5 F (37.5 C), resp. rate 20, height  5' 9 (1.753 m), weight 62.6 kg, SpO2 100%. CVP:  [2 mmHg-15 mmHg] 11 mmHg  Vent Mode: PRVC FiO2 (%):  [40 %] 40 % Set Rate:  [20 bmp] 20 bmp Vt Set:  [560 mL] 560 mL PEEP:  [5 cmH20] 5 cmH20 Plateau Pressure:  [16 cmH20-18 cmH20] 17 cmH20   Intake/Output Summary (Last 24 hours) at 11/24/2023 0730 Last data filed at 11/24/2023 0655 Gross per 24 hour  Intake 1736.12 ml  Output 300 ml  Net 1436.12 ml   Filed Weights   11/22/23 1138 11/23/23 0100 11/24/23 0457  Weight: 54.4 kg 56.3 kg 62.6 kg    Examination: General: chronically ill appearing man lying in bed in NAD  HENT: Harry Young/AT, eyes anicteric Lungs: breathing comfortably on PS/CPAP. Apneic on SBT.  Cardiovascular: S1S2, RRR Abdomen: soft, NT Extremities: minimal muscle mass Neuro: awake, alert, writing to answer quesions GU:  foley with amber urine  WBC 19.4 H/H 11.5/34.7 Platelets 150 Trach aspirate> abundant PMN, no organisms Blood cultures NGTD  Resolved problem list   Assessment and Plan   Acute hypoxic and hypercarbic respiratory failure 2/2 AECOPD COPD  Multiple solid subcentimeter peripheral pulmonary nodules; likely lung mets -con't ceftriaxone  and azithromycin  -follow trach aspirate culture -steroids -yupelri , brovana  -LTVV -VAP prevention protocol -PAD protocol for sedation -daily SAT & SBT as appropriate> very sensitive to residual fentanyl  causing respiratory suppression  Septic shock 2/2 CAP -vasopressors as required to maintain MAP >65 -con't ceftriaxone  and azithromycin   Acute metabolic encephalopathy due to hypercapnia -corrected hypercapnia; likely will need bipap post-extubation -avoid sedating meds -avoid deliriogenic meds  Hypophosphatemia ; resolved -monitor  CKD 3a -  strict I/O -renally dose meds, avoid nephrotoxic meds  Bladder cancer, 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 He was pending TURBT, but surgery will have to be delayed -needs more evaluation or  follow up for pulmonary nodules that are likely metastatic disease> suspect he needs a PET as OP   Severe protein calorie malnutrition due to malignancy Hyperglycemia  -SSI PRN; but no BG recorded -goal BG 140-180   Best Practice (right click and Reselect all SmartList Selections daily)   Diet/type: tubefeeds and NPO DVT prophylaxis prophylactic heparin   GI prophylaxis: H2B Lines: Central line and yes and it is still needed Foley:  Yes, and it is still needed Code Status:  limited Last date of multidisciplinary goals of care discussion [see IPAL]  Labs   CBC: Recent Labs  Lab 11/22/23 1148 11/23/23 0451 11/24/23 0455  WBC 22.3* 17.9* 19.4*  HGB 15.4 12.9* 11.5*  HCT 47.4 38.8* 34.7*  MCV 98.3 96.8 97.2  PLT 221 194 150    Basic Metabolic Panel: Recent Labs  Lab 11/22/23 1148 11/22/23 1640 11/23/23 0451 11/23/23 0823 11/23/23 1640 11/24/23 0455  NA 139  --  135  --   --   --   K 4.3  --  3.4*  --   --   --   CL 101  --  101  --   --   --   CO2 26  --  25  --   --   --   GLUCOSE 152*  --  260*  --   --   --   BUN 18  --  31*  --   --   --   CREATININE 1.32*  --  1.66*  --   --   --   CALCIUM 9.6  --  9.1  --   --   --   MG  --   --  2.4 2.3 2.2 2.2  PHOS  --  3.1 <1.0* 1.7* 4.0 4.1   GFR: Estimated Creatinine Clearance: 37.2 mL/min (A) (by C-G formula based on SCr of 1.66 mg/dL (H)). Recent Labs  Lab 11/22/23 1148 11/22/23 1640 11/22/23 1838 11/23/23 0451 11/23/23 0823 11/24/23 0455  PROCALCITON  --  2.63  --  6.70  --  4.87  WBC 22.3*  --   --  17.9*  --  19.4*  LATICACIDVEN  --  5.5* 4.5*  --  1.4  --     Critical care time:      This patient is critically ill with multiple organ system failure which requires frequent high complexity decision making, assessment, support, evaluation, and titration of therapies. This was completed through the application of advanced monitoring technologies and extensive interpretation of multiple databases.  During this encounter critical care time was devoted to patient care services described in this note for 38 minutes.  Harry SHAUNNA Gaskins, DO 11/24/23 10:21 AM [facility]  For contact information, see Amion. If no response to pager, please call PCCM consult pager. After hours, 7PM- 7AM, please call Elink.

## 2023-11-24 NOTE — Progress Notes (Signed)
   11/24/23 2257  BiPAP/CPAP/SIPAP  Reason BIPAP/CPAP not in use Other(comment) (PATIENT STATED HE IS NOT SLEEPING AT THE MOMENT AND HE WANTS TO DO WITHOUT IT. THE NURSE STATED THAT SHE WOULD LET ME KNOW IF HE NEEDED IT.)  BiPAP/CPAP /SiPAP Vitals  Temp 99.3 F (37.4 C)  Pulse Rate 95  Resp (!) 21  SpO2 100 %  MEWS Score/Color  MEWS Score 1  MEWS Score Color Green

## 2023-11-24 NOTE — Procedures (Signed)
 Extubation Procedure Note  Patient Details:   Name: Harry Young DOB: 05-02-1956 MRN: [id number]   Airway Documentation:    Vent end date: 11/24/23 Vent end time: 1038   Evaluation  O2 sats: stable throughout Complications: No apparent complications Patient did tolerate procedure well. Bilateral Breath Sounds: Clear, Diminished   Yes  [name] 11/24/2023, 10:52 AM

## 2023-11-24 NOTE — Progress Notes (Signed)
 CORTRAK TUBE INSERTION   A 10 F Cortrak tube has been placed. The tube was secured at the left nare at 64 cm by a member of the Cortrak tube team. The tube tip is in the stomach (in the stomach vs post pyloric).  .   X-ray is required, abdominal x-ray has been ordered. Please confirm tube placement before using the Cortrak tube.    If the tube becomes dislodged please keep the tube and contact the Cortrak team at [email or web address] (password [email or web address]) for replacement.  If after hours and replacement cannot be delayed, place a NG tube and confirm placement with an abdominal x-ray.  Norleen JAYSON Fila, RN 11/24/2023 6:47 PM

## 2023-11-25 DIAGNOSIS — G2581 Restless legs syndrome: Secondary | ICD-10-CM

## 2023-11-25 DIAGNOSIS — J441 Chronic obstructive pulmonary disease with (acute) exacerbation: Secondary | ICD-10-CM | POA: Diagnosis not present

## 2023-11-25 DIAGNOSIS — N1831 Chronic kidney disease, stage 3a: Secondary | ICD-10-CM | POA: Diagnosis not present

## 2023-11-25 DIAGNOSIS — J9601 Acute respiratory failure with hypoxia: Secondary | ICD-10-CM

## 2023-11-25 DIAGNOSIS — E039 Hypothyroidism, unspecified: Secondary | ICD-10-CM | POA: Diagnosis not present

## 2023-11-25 DIAGNOSIS — J9602 Acute respiratory failure with hypercapnia: Secondary | ICD-10-CM | POA: Diagnosis not present

## 2023-11-25 DIAGNOSIS — E43 Unspecified severe protein-calorie malnutrition: Secondary | ICD-10-CM | POA: Diagnosis not present

## 2023-11-25 LAB — CBC
HCT: 36.2 % — ABNORMAL LOW (ref 39.0–52.0)
Hemoglobin: 11.8 g/dL — ABNORMAL LOW (ref 13.0–17.0)
MCH: 32.3 pg (ref 26.0–34.0)
MCHC: 32.6 g/dL (ref 30.0–36.0)
MCV: 99.2 fL (ref 80.0–100.0)
Platelets: 170 K/uL (ref 150–400)
RBC: 3.65 MIL/uL — ABNORMAL LOW (ref 4.22–5.81)
RDW: 14.8 % (ref 11.5–15.5)
WBC: 18.1 K/uL — ABNORMAL HIGH (ref 4.0–10.5)
nRBC: 0 % (ref 0.0–0.2)

## 2023-11-25 LAB — CULTURE, RESPIRATORY W GRAM STAIN: Culture: NO GROWTH

## 2023-11-25 LAB — PHOSPHORUS: Phosphorus: 4.1 mg/dL (ref 2.5–4.6)

## 2023-11-25 LAB — BASIC METABOLIC PANEL WITH GFR
Anion gap: 13 (ref 5–15)
BUN: 42 mg/dL — ABNORMAL HIGH (ref 8–23)
CO2: 23 mmol/L (ref 22–32)
Calcium: 9 mg/dL (ref 8.9–10.3)
Chloride: 104 mmol/L (ref 98–111)
Creatinine, Ser: 1.51 mg/dL — ABNORMAL HIGH (ref 0.61–1.24)
GFR, Estimated: 50 mL/min — ABNORMAL LOW (ref >60)
Glucose, Bld: 123 mg/dL — ABNORMAL HIGH (ref 70–99)
Potassium: 4.4 mmol/L (ref 3.5–5.1)
Sodium: 140 mmol/L (ref 135–145)

## 2023-11-25 LAB — PROCALCITONIN: Procalcitonin: 2.3 ng/mL

## 2023-11-25 LAB — MAGNESIUM: Magnesium: 2.5 mg/dL — ABNORMAL HIGH (ref 1.7–2.4)

## 2023-11-25 MED ORDER — HYDROCODONE-ACETAMINOPHEN 10-325 MG PO TABS
1.0000 | ORAL_TABLET | Freq: Three times a day (TID) | ORAL | Status: DC | PRN
  Administered 2023-11-26 – 2023-11-28 (×4): 1 via ORAL
  Filled 2023-11-25 (×4): qty 1

## 2023-11-25 MED ORDER — IPRATROPIUM-ALBUTEROL 0.5-2.5 (3) MG/3ML IN SOLN
3.0000 mL | Freq: Once | RESPIRATORY_TRACT | Status: DC
  Filled 2023-11-25: qty 3

## 2023-11-25 MED ORDER — LORAZEPAM 0.5 MG PO TABS
0.5000 mg | ORAL_TABLET | Freq: Four times a day (QID) | ORAL | Status: DC | PRN
  Administered 2023-11-25 – 2023-11-26 (×3): 0.5 mg via ORAL
  Filled 2023-11-25 (×3): qty 1

## 2023-11-25 MED ORDER — ROPINIROLE HCL 1 MG PO TABS
1.0000 mg | ORAL_TABLET | Freq: Three times a day (TID) | ORAL | Status: DC
  Administered 2023-11-25 – 2023-11-28 (×10): 1 mg via ORAL
  Filled 2023-11-25 (×10): qty 1

## 2023-11-25 MED ORDER — RESTFUL LEGS SL SUBL: SUBLINGUAL_TABLET | Freq: Every day | SUBLINGUAL | Status: DC

## 2023-11-25 MED ORDER — HYDROXYZINE HCL 25 MG PO TABS
25.0000 mg | ORAL_TABLET | Freq: Three times a day (TID) | ORAL | Status: DC | PRN
  Administered 2023-11-25: 25 mg via ORAL
  Filled 2023-11-25: qty 1

## 2023-11-25 MED ORDER — MORPHINE SULFATE (PF) 2 MG/ML IV SOLN
1.0000 mg | INTRAVENOUS | Status: DC | PRN
  Administered 2023-11-25 – 2023-11-26 (×3): 2 mg via INTRAVENOUS
  Filled 2023-11-25 (×3): qty 1

## 2023-11-25 MED ORDER — NICOTINE 21 MG/24HR TD PT24
21.0000 mg | MEDICATED_PATCH | Freq: Every day | TRANSDERMAL | Status: DC
  Administered 2023-11-25 – 2023-11-28 (×4): 21 mg via TRANSDERMAL
  Filled 2023-11-25 (×4): qty 1

## 2023-11-25 MED ORDER — OXYCODONE HCL 5 MG PO TABS
5.0000 mg | ORAL_TABLET | Freq: Four times a day (QID) | ORAL | Status: DC | PRN
  Administered 2023-11-25: 5 mg via ORAL
  Filled 2023-11-25: qty 1

## 2023-11-25 MED ORDER — METHOCARBAMOL 1000 MG/10ML IJ SOLN
500.0000 mg | Freq: Three times a day (TID) | INTRAMUSCULAR | Status: DC | PRN
  Administered 2023-11-26 (×2): 500 mg via INTRAVENOUS
  Filled 2023-11-25 (×2): qty 10

## 2023-11-25 NOTE — Evaluation (Signed)
 Physical Therapy Evaluation Patient Details Name: Harry Young MRN: [id number] DOB: January 23, 1956 Today's Date: 11/25/2023  History of Present Illness  Pt admitted from home with respiratory failure 2* COPD exacerbation - intubated 11/22/23 and extubated 11/24/23.  Pt with hx of COPD, spinal stenosis, back surgeries, bladder CA, urothelial CA s/p L nephrectomy, aortic valve insufficiency, and HOH L ear.  Clinical Impression  Pt admitted as above and presenting with functional mobility limitations 2* generalized weakness, mild ambulatory balance deficits and decreased activity tolerance.  Pt very motivated and should progress well to dc home with family assist.        If plan is discharge home, recommend the following: A little help with walking and/or transfers;A little help with bathing/dressing/bathroom;Assistance with cooking/housework;Assist for transportation;Help with stairs or ramp for entrance   Can travel by private vehicle        Equipment Recommendations None recommended by PT  Recommendations for Other Services       Functional Status Assessment Patient has had a recent decline in their functional status and demonstrates the ability to make significant improvements in function in a reasonable and predictable amount of time.     Precautions / Restrictions Precautions Precautions: Fall Recall of Precautions/Restrictions: Intact Precaution/Restrictions Comments: Pt very impulsive Restrictions Weight Bearing Restrictions Per Provider Order: No      Mobility  Bed Mobility Overal bed mobility: Needs Assistance Bed Mobility: Supine to Sit     Supine to sit: Contact guard     General bed mobility comments: for safety    Transfers Overall transfer level: Needs assistance Equipment used: Rolling walker (2 wheels) Transfers: Sit to/from Stand Sit to Stand: Contact guard assist           General transfer comment: Steady assist and cues to slow down for safety     Ambulation/Gait Ambulation/Gait assistance: Contact guard assist Gait Distance (Feet): 150 Feet Assistive device: Rolling walker (2 wheels) Gait Pattern/deviations: Step-through pattern, Shuffle       General Gait Details: cues for posture, position from RW and to slow pace for safety  Stairs            Wheelchair Mobility     Tilt Bed    Modified Rankin (Stroke Patients Only)       Balance Overall balance assessment: Mild deficits observed, not formally tested                                           Pertinent Vitals/Pain Pain Assessment Pain Assessment: No/denies pain    Home Living Family/patient expects to be discharged to:: Private residence Living Arrangements: Spouse/significant other Available Help at Discharge: Family;Available 24 hours/day Type of Home: House Home Access: Stairs to enter Entrance Stairs-Rails: Right Entrance Stairs-Number of Steps: 3   Home Layout: One level Home Equipment: None      Prior Function Prior Level of Function : Independent/Modified Independent             Mobility Comments: goes camping       Extremity/Trunk Assessment   Upper Extremity Assessment Upper Extremity Assessment: Generalized weakness    Lower Extremity Assessment Lower Extremity Assessment: Generalized weakness    Cervical / Trunk Assessment Cervical / Trunk Assessment: Normal  Communication   Communication Communication: No apparent difficulties Factors Affecting Communication: Hearing impaired (L ear)    Cognition Arousal: Alert Behavior During Therapy: [facility]  for tasks assessed/performed, Impulsive   PT - Cognitive impairments: No apparent impairments                         Following commands: Intact       Cueing Cueing Techniques: Verbal cues, Gestural cues     General Comments      Exercises     Assessment/Plan    PT Assessment Patient needs continued PT services  PT Problem List  Decreased strength;Decreased range of motion;Decreased activity tolerance;Decreased balance;Decreased mobility;Decreased knowledge of use of DME;Decreased safety awareness       PT Treatment Interventions DME instruction;Gait training;Stair training;Functional mobility training;Therapeutic activities;Therapeutic exercise;Balance training;Patient/family education    PT Goals (Current goals can be found in the Care Plan section)  Acute Rehab PT Goals Patient Stated Goal: Regain IND PT Goal Formulation: With patient Time For Goal Achievement: 12/09/23 Potential to Achieve Goals: Good    Frequency 7X/week     Co-evaluation PT/OT/SLP Co-Evaluation/Treatment: Yes Reason for Co-Treatment: For patient/therapist safety;To address functional/ADL transfers PT goals addressed during session: Mobility/safety with mobility OT goals addressed during session: ADL's and self-care       AM-PAC PT 6 Clicks Mobility  Outcome Measure Help needed turning from your back to your side while in a flat bed without using bedrails?: A Little Help needed moving from lying on your back to sitting on the side of a flat bed without using bedrails?: A Little Help needed moving to and from a bed to a chair (including a wheelchair)?: A Little Help needed standing up from a chair using your arms (e.g., wheelchair or bedside chair)?: A Little Help needed to walk in hospital room?: A Little Help needed climbing 3-5 steps with a railing? : A Lot 6 Click Score: 17    End of Session Equipment Utilized During Treatment: Gait belt;Oxygen Activity Tolerance: Patient tolerated treatment well Patient left: in chair;with call bell/phone within reach;with chair alarm set;with family/visitor present Nurse Communication: Mobility status PT Visit Diagnosis: Difficulty in walking, not elsewhere classified (R26.2)    Time: 8864-8789 PT Time Calculation (min) (ACUTE ONLY): 35 min   Charges:   PT Evaluation $PT Eval Low  Complexity: 1 Low PT Treatments $Gait Training: 8-22 mins PT General Charges $$ ACUTE PT VISIT: 1 Visit         [facility] PT Acute Rehabilitation Services Office [phone]   Harry Young 11/25/2023, 3:49 PM

## 2023-11-25 NOTE — Progress Notes (Signed)
   11/25/23 0324  BiPAP/CPAP/SIPAP  BiPAP/CPAP/SIPAP Pt Type Adult (PATIENT WAS DESATTING AND GOING THROUGH SOME DISTRESS, BIPAP WAS PLACED ON PATIENT, AND HE IS DOING A LOT BETTER AT THIS TIME.)  BiPAP/CPAP/SIPAP SERVO  Mask Type Full face mask  Dentures removed? Not applicable  Mask Size Medium  Set Rate 15 breaths/min  Respiratory Rate 18 breaths/min  IPAP 10 cmH20  EPAP 5 cmH2O  Pressure Support 5 cmH20  PEEP 5 cmH20  FiO2 (%) 30 %  Minute Ventilation 14.2  Leak 49  Peak Inspiratory Pressure (PIP) 9  Tidal Volume (Vt) 875  Patient Home Machine No  Patient Home Mask No  Patient Home Tubing No  Auto Titrate No  Press High Alarm 25 cmH2O  Device Plugged into RED Power Outlet Yes  Oxygen Percent 30 %  BiPAP/CPAP /SiPAP Vitals  Temp 98.2 F (36.8 C)  Pulse Rate 95  Resp 17  SpO2 100 %  MEWS Score/Color  MEWS Score 0  MEWS Score Color Landy

## 2023-11-25 NOTE — Progress Notes (Signed)
 [facility]                                                                               Harry Young, is a 68 y.o. male, DOB - 1956-04-25, FMW:[id number] Admit date - 11/22/2023    Outpatient Primary MD for the patient is Corlis Pagan, NP  LOS - 3  days    Brief summary    68 year old male patient followed previously in our office, with known history of COPD and active tobacco abuse.  He is currently being treated by urology with plan for bladder surgery initially scheduled for 7/11 for his known bladder cancer.   Presented to presurgical testing on 7/10 with plan for TURBT to be done by Dr. Christopher on the 11th.  Noting 3 to 4 days of worsening cough with associated shortness of breath he appeared pale on presentation, tachycardic, room air saturations were 86% and he was subsequently referred to the emergency room for further evaluation.  On arrival from preanesthesia to the ER pulse oximetry is in the 70s to 80s breathing in a tripod position placed on 15 L with improvement. He was subsequently started on NIPPV.  Initial evaluation in the emergency revealed the following: Venous blood gas with pH of 7.13 pCO2 of 86 pO2 of 51 HCO3 29 serum creatinine 1.32 this is around his baseline white blood cell count 22.3.  He was admitted by PCCM and intubated for AECOPD and pneumonia. He was subsequently extubated on 7/12 and transferred to [facility] on 7/13 Today he started having restless legs, became tachycardic and anxious. As per the wife, he takes hydrocodone  10 mg every 8 hours at home. He is requesting medications for RLS and pain.   Assessment & Plan    Assessment and Plan:   Acute hypoxic and hypercarbic respiratory failure secondary to COPD Continue with IV antibiotics to complete the course and IV Solu-Medrol . Continue with long-acting bronchodilators and BiPAP as needed.  He is currently on 2 L of nasal cannula oxygen    Opioid withdrawals and RLS Restarted home meds   Added morphine  and robaxin .     Septic shock sec to CAP Off pressors and d/c central line.     Acute metabolic encephalopathy due to hypercapnia Resolved   3A CKD Continue to monitor   Severe protein calorie malnutrition due to malignancy Nutritional consult will be consulted    Multifocal urothelial carcinoma Recommend outpatient follow-up with urology   Dysphagia Patient currently on regular diet    RN Pressure Injury Documentation:    Malnutrition Type:  Nutrition Problem: Severe Malnutrition Etiology: chronic illness (COPD)   Malnutrition Characteristics:  Signs/Symptoms: severe fat depletion, severe muscle depletion   Nutrition Interventions:  Interventions: Refer to RD note for recommendations, Tube feeding  Estimated body mass index is 19.5 kg/m as calculated from the following:   Height as of this encounter: 5' 9 (1.753 m).   Weight as of this encounter: 59.9 kg.  Code Status: DNR  DVT Prophylaxis:  heparin  injection 5,000 Units Start: 11/23/23 0915 SCDs Start: 11/22/23 1508   Level of Care: Level of care: Stepdown Family Communication: family at bedside.   Disposition Plan:  Remains inpatient appropriate:  pending clinical improvement.   Procedures:  S/p extubation on 7/12   Consultants:   PCCM.   Antimicrobials:   Anti-infectives (From admission, onward)    Start     Dose/Rate Route Frequency Ordered Stop   11/22/23 1600  azithromycin  (ZITHROMAX ) 500 mg in sodium chloride  0.9 % 250 mL IVPB  Status:  Discontinued        500 mg 250 mL/hr over 60 Minutes Intravenous Every 24 hours 11/22/23 1538 11/25/23 0848   11/22/23 1530  cefTRIAXone  (ROCEPHIN ) 2 g in sodium chloride  0.9 % 100 mL IVPB        2 g 200 mL/hr over 30 Minutes Intravenous Every 24 hours 11/22/23 1539          Medications  Scheduled Meds:  arformoterol   15 mcg Nebulization BID   Chlorhexidine  Gluconate Cloth  6 each Topical Daily   docusate  100 mg  Per Tube BID   heparin  injection (subcutaneous)  5,000 Units Subcutaneous Q8H   insulin  aspart  0-9 Units Subcutaneous Q4H   ipratropium-albuterol   3 mL Nebulization QID   ipratropium-albuterol   3 mL Nebulization Once   methylPREDNISolone  (SOLU-MEDROL ) injection  40 mg Intravenous Q12H   nicotine   21 mg Transdermal Daily   mouth rinse  15 mL Mouth Rinse 4 times per day   oxidized cellulose  1 each Topical Once   polyethylene glycol  17 g Per Tube Daily   Restful Legs   Sublingual QHS   revefenacin   175 mcg Nebulization Daily   rOPINIRole   1 mg Oral TID   sodium chloride  flush  10-40 mL Intracatheter Q12H   thiamine   100 mg Per Tube Daily   Continuous Infusions:  cefTRIAXone  (ROCEPHIN )  IV Stopped (11/25/23 1513)   feeding supplement (VITAL AF 1.2 CAL) 15 mL/hr at 11/25/23 1611   PRN Meds:.acetaminophen  **OR** acetaminophen , docusate sodium , HYDROcodone -acetaminophen , LORazepam , morphine  injection, ondansetron  **OR** ondansetron  (ZOFRAN ) IV, mouth rinse, polyethylene glycol, sodium chloride  flush    Subjective:   Harry Young was seen and examined today.  Restless, irritated.   Objective:   Vitals:   11/25/23 1610 11/25/23 1611 11/25/23 1612 11/25/23 1613  BP:    (!) 151/110  Pulse: (!) 130 (!) 136 (!) 138 (!) 130  Resp: (!) 30 (!) 25 (!) 23 (!) 26  Temp: 99.1 F (37.3 C) 99.1 F (37.3 C) 99.1 F (37.3 C) 99.1 F (37.3 C)  TempSrc:    Bladder  SpO2: 95% 97% 99% 96%  Weight:      Height:        Intake/Output Summary (Last 24 hours) at 11/25/2023 1724 Last data filed at 11/25/2023 1611 Gross per 24 hour  Intake 667.75 ml  Output 975 ml  Net -307.25 ml   Filed Weights   11/23/23 0100 11/24/23 0457 11/25/23 0500  Weight: 56.3 kg 62.6 kg 59.9 kg     Exam General exam: ill appearing elderly man, in distress from RLS.  Respiratory system: Clear to auscultation. Respiratory effort normal. Cardiovascular system: S1 & S2 heard, tachycardia. No JVD,  Gastrointestinal  system: Abdomen is nondistended, soft and nontender.  Central nervous system: Alert and oriented but anxious.  Extremities: no pedal edema or cyanosis.  Skin: No rashes,  Psychiatry: Anxious.     Data Reviewed:  I have personally reviewed following labs and imaging studies   CBC Lab Results  Component Value Date   WBC 18.1 (H) 11/25/2023   RBC 3.65 (L) 11/25/2023   HGB  11.8 (L) 11/25/2023   HCT 36.2 (L) 11/25/2023   MCV 99.2 11/25/2023   MCH 32.3 11/25/2023   PLT 170 11/25/2023   MCHC 32.6 11/25/2023   RDW 14.8 11/25/2023   LYMPHSABS 2.3 01/12/2013   MONOABS 1.1 (H) 01/12/2013   EOSABS 0.0 01/12/2013   BASOSABS 0.0 01/12/2013     Last metabolic panel Lab Results  Component Value Date   NA 140 11/25/2023   K 4.4 11/25/2023   CL 104 11/25/2023   CO2 23 11/25/2023   BUN 42 (H) 11/25/2023   CREATININE 1.51 (H) 11/25/2023   GLUCOSE 123 (H) 11/25/2023   GFRNONAA 50 (L) 11/25/2023   GFRAA 86 (L) 01/17/2013   CALCIUM 9.0 11/25/2023   PHOS 4.1 11/25/2023   PROT 6.5 11/23/2023   ALBUMIN 3.5 11/23/2023   BILITOT 0.9 11/23/2023   ALKPHOS 84 11/23/2023   AST 19 11/23/2023   ALT 15 11/23/2023   ANIONGAP 13 11/25/2023    CBG (last 3)  Recent Labs    11/23/23 0734 11/23/23 1122  GLUCAP 168* 167*      Coagulation Profile: No results for input(s): INR, PROTIME in the last 168 hours.   Radiology Studies: DG Abd 1 View Result Date: 11/24/2023 CLINICAL DATA:  Nasogastric tube placement EXAM: ABDOMEN - 1 VIEW COMPARISON:  11/23/2023 FINDINGS: Tip of the weighted enteric tube below the diaphragm just to the left of midline in the region of the mid stomach. The previous non weighted enteric tube is no longer seen. No evidence of bowel obstruction in the included upper abdomen. IMPRESSION: Tip of the weighted enteric tube below the diaphragm just to the left of midline in the region of the mid stomach. Electronically Signed   By: Andrea Gasman M.D.   On: 11/24/2023  19:45       Elgie Butter M.D. [facility] 11/25/2023, 5:24 PM  Available via Epic secure chat 7am-7pm After 7 pm, please refer to night coverage provider listed on amion.

## 2023-11-25 NOTE — Plan of Care (Signed)
  Problem: Respiratory: Goal: Ability to maintain a clear airway will improve Outcome: Progressing Goal: Levels of oxygenation will improve Outcome: Progressing Goal: Ability to maintain adequate ventilation will improve Outcome: Progressing   Problem: Clinical Measurements: Goal: Ability to maintain clinical measurements within normal limits will improve Outcome: Progressing Goal: Will remain free from infection Outcome: Progressing Goal: Diagnostic test results will improve Outcome: Progressing Goal: Respiratory complications will improve Outcome: Progressing

## 2023-11-25 NOTE — Progress Notes (Signed)
 NAME:  Harry Young, MRN:  [id number], DOB:  January 26, 1956, LOS: 3 ADMISSION DATE:  11/22/2023, CONSULTATION DATE: 7/10 REFERRING MD: Jonell, CHIEF COMPLAINT: Respiratory failure  History of Present Illness:  68 year old male patient followed previously in our office, with known history of COPD and active tobacco abuse.  He is currently being treated by urology with plan for bladder surgery initially scheduled for 7/11 for his known bladder cancer.  Presented to presurgical testing on 7/10 with plan for TURBT to be done by Dr. Christopher on the 11th.  Noting 3 to 4 days of worsening cough with associated shortness of breath he appeared pale on presentation, tachycardic, room air saturations were 86% and he was subsequently referred to the emergency room for further evaluation.  On arrival from preanesthesia to the ER pulse oximetry is in the 70s to 80s breathing in a tripod position placed on 15 L with improvement. He was subsequently started on NIPPV.  Initial evaluation in the emergency revealed the following: Venous blood gas with pH of 7.13 pCO2 of 86 pO2 of 51 HCO3 29 serum creatinine 1.32 this is around his baseline white blood cell count 22.3.  Portable chest x-ray was clear without focal infiltrates, was hyper distended  Critical care asked to evaluate.   Pertinent  Medical History  Tobacco abuse, emphysema/COPD, bladder cancer, left upper lobe spiculated lung mass, hearing loss, bilateral carotid artery stenosis, coronary artery disease, hypertension, hypothyroidism, mixed hyperlipidemia  Significant Hospital Events: Including procedures, antibiotic start and stop dates in addition to other pertinent events   7/10: pre-op>distress>ER, put on NIPPV. CXR no infiltrate> admit ICU for AECOPD, pneumonia 7/12 extubated to bipap; failed swallow eval. Cortrak placed.   Interim History / Subjective:  Respiratory distress overnight, back on bipap.  Objective    Blood pressure (!) 138/55, pulse 73,  temperature 98.6 F (37 C), resp. rate 18, height 5' 9 (1.753 m), weight 59.9 kg, SpO2 99%. CVP:  [2 mmHg-19 mmHg] 12 mmHg  Vent Mode: CPAP;PSV FiO2 (%):  [30 %-40 %] 30 % Set Rate:  [20 bmp] 20 bmp Vt Set:  [560 mL] 560 mL PEEP:  [5 cmH20] 5 cmH20 Pressure Support:  [5 cmH20] 5 cmH20 Plateau Pressure:  [20 cmH20] 20 cmH20   Intake/Output Summary (Last 24 hours) at 11/25/2023 0724 Last data filed at 11/25/2023 0600 Gross per 24 hour  Intake 95.51 ml  Output 825 ml  Net -729.49 ml   Filed Weights   11/23/23 0100 11/24/23 0457 11/25/23 0500  Weight: 56.3 kg 62.6 kg 59.9 kg    Examination: General: Chronically ill appearing man sitting up in bed in no acute distress HENT: [place]/AT, eyes anicteric, cortrak in place Lungs: Breathing comfortably on nasal cannula, no wheezing or accessory muscle use. Cardiovascular: S1-S2, regular rate and rhythm Abdomen: Soft, nontender Extremities: Minimal muscle mass Neuro: Awake, alert.  Not talking much but answering questions when prompted.  WBC 18.1 H/H 11.8/36.2 Platelets 170 Trach aspirate> abundant PMN, no organisms> NGTD Blood cultures NGTD  Resolved problem list   Hypophosphatemia ; resolved  Assessment and Plan   Acute hypoxic and hypercarbic respiratory failure 2/2 AECOPD COPD  Multiple solid subcentimeter peripheral pulmonary nodules; likely lung mets - Completed 3 days of azithromycin , complete course of ceftriaxone  - Continue to follow sputum cultures - Continue steroids - Continue long-acting bronchodilators-Yupelri  and Brovana . - BiPAP as needed.   Septic shock 2/2 CAP; shock resolved -Continue ceftriaxone  - Off pressors -d/c central line today  Acute metabolic encephalopathy  due to hypercapnia-resolved -Avoid sedating meds -OOB mobility, PT and OT  CKD 3a - Recheck BMP today - Dissection - Renally dose meds and avoid nephrotoxic meds  Bladder cancer, long history of recurrent multifocal urethral  carcinoma He was pending TURBT scheduled for 7/11, but surgery has been delayed -needs more evaluation or follow up for pulmonary nodules that are likely metastatic disease> suspect he needs a PET as OP  -d/c foley today  Severe protein calorie malnutrition due to malignancy Hyperglycemia  -SSI PRN - Still has no blood glucoses recorded - Goal blood glucose 140-180  Concern for dysphagia, may be due to dyspnea from COPD -MBSS tomorrow, but SLP recommending regular diet and thin liquids -plan to con't TF for now due to wife's report of minimal PO intake at home and significant malnutrition chronically   Best Practice (right click and Reselect all SmartList Selections daily)   Diet/type: tubefeeds and Regular consistency (see orders) DVT prophylaxis prophylactic heparin   GI prophylaxis: H2B Lines: No longer needed.  Order written to d/c  Foley:  removal ordered  Code Status:  limited Last date of multidisciplinary goals of care discussion [see IPAL]  Labs   CBC: Recent Labs  Lab 11/22/23 1148 11/23/23 0451 11/24/23 0455 11/25/23 0330  WBC 22.3* 17.9* 19.4* 18.1*  HGB 15.4 12.9* 11.5* 11.8*  HCT 47.4 38.8* 34.7* 36.2*  MCV 98.3 96.8 97.2 99.2  PLT 221 194 150 170    Basic Metabolic Panel: Recent Labs  Lab 11/22/23 1148 11/22/23 1640 11/23/23 0451 11/23/23 0823 11/23/23 1640 11/24/23 0455 11/24/23 1653 11/25/23 0330  NA 139  --  135  --   --   --   --   --   K 4.3  --  3.4*  --   --   --   --   --   CL 101  --  101  --   --   --   --   --   CO2 26  --  25  --   --   --   --   --   GLUCOSE 152*  --  260*  --   --   --   --   --   BUN 18  --  31*  --   --   --   --   --   CREATININE 1.32*  --  1.66*  --   --   --   --   --   CALCIUM 9.6  --  9.1  --   --   --   --   --   MG  --    < > 2.4 2.3 2.2 2.2 2.3 2.5*  PHOS  --    < > <1.0* 1.7* 4.0 4.1 4.9* 4.1   < > = values in this interval not displayed.   GFR: Estimated Creatinine Clearance: 35.6 mL/min (A)  (by C-G formula based on SCr of 1.66 mg/dL (H)). Recent Labs  Lab 11/22/23 1148 11/22/23 1640 11/22/23 1838 11/23/23 0451 11/23/23 0823 11/24/23 0455 11/25/23 0330  PROCALCITON  --  2.63  --  6.70  --  4.87 2.30  WBC 22.3*  --   --  17.9*  --  19.4* 18.1*  LATICACIDVEN  --  5.5* 4.5*  --  1.4  --   --     Critical care time:       Leita SHAUNNA Gaskins, DO 11/25/23 8:56 AM [facility]  For  contact information, see Amion. If no response to pager, please call PCCM consult pager. After hours, 7PM- 7AM, please call Elink.

## 2023-11-25 NOTE — Evaluation (Signed)
 Occupational Therapy Evaluation Patient Details Name: Harry Young MRN: [id number] DOB: [birth year] Today's Date: 11/25/2023   History of Present Illness   Pt admitted from home with respiratory failure 2* COPD exacerbation - intubated 11/22/23 and extubated 11/24/23.  Pt with hx of COPD, spinal stenosis, back surgeries, bladder CA, urothelial CA s/p L nephrectomy, aortic valve insufficiency, and HOH L ear.     Clinical Impressions Pt admitted with the above. Pt currently with functional limitations due to the deficits listed below (see OT Problem List).  Pt will benefit from acute skilled OT to increase their safety and independence with ADL and functional mobility for ADL to facilitate discharge.  Pt very motivated!     If plan is discharge home, recommend the following:   A little help with walking and/or transfers;A little help with bathing/dressing/bathroom     Functional Status Assessment   Patient has had a recent decline in their functional status and demonstrates the ability to make significant improvements in function in a reasonable and predictable amount of time.       Precautions/Restrictions   Precautions Precautions: Fall Recall of Precautions/Restrictions: Intact Precaution/Restrictions Comments: Pt very impulsive Restrictions Weight Bearing Restrictions Per Provider Order: No     Mobility Bed Mobility Overal bed mobility: Needs Assistance Bed Mobility: Supine to Sit     Supine to sit: Contact guard     General bed mobility comments: for safety    Transfers Overall transfer level: Needs assistance Equipment used: Rolling walker (2 wheels) Transfers: Sit to/from Stand Sit to Stand: Contact guard assist           General transfer comment: Steady assist and cues to slow down for safety      Balance Overall balance assessment: Mild deficits observed, not formally tested                                         ADL either  performed or assessed with clinical judgement   ADL Overall ADL's : Needs assistance/impaired Eating/Feeding: Set up;Sitting   Grooming: Set up;Sitting   Upper Body Bathing: Set up;Sitting   Lower Body Bathing: Moderate assistance;Sit to/from stand;Cueing for safety;+2 for physical assistance   Upper Body Dressing : Set up;Sitting   Lower Body Dressing: Minimal assistance   Toilet Transfer: Minimal assistance   Toileting- Clothing Manipulation and Hygiene: Minimal assistance         General ADL Comments: VC for safety and slowing down with actiivty     Vision Patient Visual Report: No change from baseline              Pertinent Vitals/Pain Pain Assessment Pain Assessment: No/denies pain     Extremity/Trunk Assessment Upper Extremity Assessment Upper Extremity Assessment: Generalized weakness   Lower Extremity Assessment Lower Extremity Assessment: Generalized weakness   Cervical / Trunk Assessment Cervical / Trunk Assessment: Normal   Communication Communication Communication: No apparent difficulties Factors Affecting Communication: Hearing impaired (L ear)   Cognition Arousal: Alert Behavior During Therapy: WFL for tasks assessed/performed Cognition: No apparent impairments             OT - Cognition Comments: pt did appear to need extra VC at times to not fidget with lines and gown.                 Following commands: Intact  Home Living Family/patient expects to be discharged to:: Private residence Living Arrangements: Spouse/significant other Available Help at Discharge: Family;Available 24 hours/day Type of Home: House Home Access: Stairs to enter [employer] of Steps: 3 Entrance Stairs-Rails: Right Home Layout: One level     Bathroom Shower/Tub: Producer, television/film/video: Standard     Home Equipment: None          Prior Functioning/Environment Prior Level of Function :  Independent/Modified Independent             Mobility Comments: goes camping      OT Problem List: Decreased strength;Decreased activity tolerance   OT Treatment/Interventions: Self-care/ADL training;Patient/family education      OT Goals(Current goals can be found in the care plan section)   Acute Rehab OT Goals Patient Stated Goal: get well OT Goal Formulation: With patient Time For Goal Achievement: 12/09/23 Potential to Achieve Goals: Good ADL Goals Pt Will Perform Grooming: with supervision;standing Pt Will Perform Lower Body Dressing: with supervision;sit to/from stand Pt Will Transfer to Toilet: with supervision;regular height toilet Pt Will Perform Toileting - Clothing Manipulation and hygiene: with supervision   OT Frequency:  Min 2X/week    Co-evaluation   Reason for Co-Treatment: For patient/therapist safety;To address functional/ADL transfers PT goals addressed during session: Mobility/safety with mobility OT goals addressed during session: ADL's and self-care      AM-PAC OT 6 Clicks Daily Activity     Outcome Measure Help from another person eating meals?: None Help from another person taking care of personal grooming?: A Little Help from another person toileting, which includes using toliet, bedpan, or urinal?: A Little Help from another person bathing (including washing, rinsing, drying)?: A Little Help from another person to put on and taking off regular upper body clothing?: A Little Help from another person to put on and taking off regular lower body clothing?: A Little 6 Click Score: 19   End of Session Equipment Utilized During Treatment: Rolling walker (2 wheels);Oxygen Nurse Communication: Mobility status  Activity Tolerance: Patient limited by fatigue Patient left: in chair;with call bell/phone within reach;with family/visitor present  OT Visit Diagnosis: Unsteadiness on feet (R26.81)                Time: 8867-8782 OT Time  Calculation (min): 45 min Charges:  OT General Charges $OT Visit: 1 Visit OT Evaluation $OT Eval Moderate Complexity: 1 Mod    Harry Young, Harry Young 11/25/2023, 4:13 PM

## 2023-11-25 NOTE — Progress Notes (Signed)
   11/25/23 0324  BiPAP/CPAP/SIPAP  BiPAP/CPAP/SIPAP Pt Type Adult  BiPAP/CPAP/SIPAP SERVO  Mask Type Full face mask  Dentures removed? Not applicable  Mask Size Medium  Set Rate 15 breaths/min  Respiratory Rate 18 breaths/min  IPAP 10 cmH20  EPAP 5 cmH2O  Pressure Support 5 cmH20  PEEP 5 cmH20  FiO2 (%) 30 %  Minute Ventilation 14.2  Leak 49  Peak Inspiratory Pressure (PIP) 9  Tidal Volume (Vt) 875  Patient Home Machine No  Patient Home Mask No  Patient Home Tubing No  Auto Titrate No  Press High Alarm 25 cmH2O  Device Plugged into RED Power Outlet Yes  Oxygen Percent 30 %  BiPAP/CPAP /SiPAP Vitals  Temp 98.2 F (36.8 C)  Pulse Rate 95  Resp 17  SpO2 100 %  MEWS Score/Color  MEWS Score 0  MEWS Score Color [place]

## 2023-11-25 NOTE — Evaluation (Signed)
 Clinical/Bedside Swallow Evaluation Patient Details  Name: Harry Young MRN: [id number] Date of Birth: 1956/03/26  Today's Date: 11/25/2023 Time: SLP Start Time (ACUTE ONLY): 0700 SLP Stop Time (ACUTE ONLY): 0715 SLP Time Calculation (min) (ACUTE ONLY): 15 min  Past Medical History:  Past Medical History:  Diagnosis Date   Anxiety disorder, unspecified    Arthritis    Asthma    as a child   Asymmetrical hearing loss of left ear 01/03/2018   Bilateral carotid artery stenosis 08/07/2015   Dr. Dorn Lesches- 04/01/15- Heterogenous plaque- 1-39% RICA stenosis.  Homogenous plaque- 40-59% LICA stenosis.  >50% LECA stenosis.  Normal subclavian arteries bilaterally.  Patent vertebral arteries with antegrade flow.   Complication of anesthesia    slow to wake up   Constipation 08/07/2015   COPD (chronic obstructive pulmonary disease) (HCC)    Coronary artery disease involving native coronary artery of native heart without angina pectoris 04/03/2019   Displacement of lumbar intervertebral disc without myelopathy 04/14/2013   Dyspnea    Essential (primary) hypertension 04/03/2019   Golfer's elbow, right 03/30/2016   Heart murmur    History of kidney removal    left kidney . due to cancer   History of kidney stones    Hyperlipidemia 08/07/2015   Hypothyroidism due to Hashimoto's thyroiditis 07/15/2015   Leg weakness 08/07/2015   Lumbar radiculopathy 10/21/2013   Nocturia    Nonrheumatic aortic valve insufficiency 02/22/2019   Nonrheumatic aortic valve stenosis 02/22/2019   Spinal stenosis of lumbar region 05/19/2015   Transitional cell carcinoma of bladder (HCC) UROLOGIST--  DR CEIL   S/P TURBT X3--  HX BCG TX'S   Vitamin D deficiency    Past Surgical History:  Past Surgical History:  Procedure Laterality Date   BRONCHIAL BIOPSY  01/09/2022   Procedure: BRONCHIAL BIOPSIES;  Surgeon: Gladis Leonor HERO, MD;  Location: [facility] ENDOSCOPY;  Service: Pulmonary;;   BRONCHIAL BRUSHINGS   01/09/2022   Procedure: BRONCHIAL BRUSHINGS;  Surgeon: Gladis Leonor HERO, MD;  Location: [facility] ENDOSCOPY;  Service: Pulmonary;;   BRONCHIAL NEEDLE ASPIRATION BIOPSY  01/09/2022   Procedure: BRONCHIAL NEEDLE ASPIRATION BIOPSIES;  Surgeon: Gladis Leonor HERO, MD;  Location: [facility] ENDOSCOPY;  Service: Pulmonary;;   CYSTOSCOPY  05/10/2022   Procedure: CYSTOSCOPY WITH STENT REMOVAL;  Surgeon: Alvaro Hummer, MD;  Location: [facility];  Service: Urology;;   PHYLLIS W/ RETROGRADES Bilateral 12/13/2012   Procedure: CYSTOSCOPY WITH RETROGRADE PYELOGRAM;  Surgeon: Mark C Ottelin, MD;  Location: [facility];  Service: Urology;  Laterality: Bilateral;   CYSTOSCOPY W/ RETROGRADES Right 04/27/2023   Procedure: CYSTOSCOPY WITH RIGHT RETROGRADE PYELOGRAM;  Surgeon: Alvaro Hummer KATHEE Mickey., MD;  Location: [facility];  Service: Urology;  Laterality: Right;  60 MINUTES NEEDED FOR CASE   CYSTOSCOPY WITH LITHOLAPAXY N/A 04/04/2013   Procedure: CYSTOSCOPY ;  Surgeon: Oneil JAYSON CEIL, MD;  Location: [facility];  Service: Urology;  Laterality: N/A;   CYSTOSCOPY WITH RETROGRADE PYELOGRAM, URETEROSCOPY AND STENT PLACEMENT Bilateral 02/28/2022   Procedure: CYSTOSCOPY WITH RETROGRADE PYELOGRAM, URETEROSCOPY, LEFT STENT PLACEMENT;  Surgeon: Rosalind Zachary KATHEE, MD;  Location: [facility];  Service: Urology;  Laterality: Bilateral;  1 HR   LUMBAR DISC SURGERY  09/20/2005   L2 -- L5   LUMBAR LAMINECTOMY/DECOMPRESSION MICRODISCECTOMY Left 01/09/2013   Procedure: Left  lumbar four-five microdiskectomy;  Surgeon: Rockey LITTIE Peru, MD;  Location: [facility];  Service: Neurosurgery;  Laterality: Left;   LUMBAR WOUND DEBRIDEMENT N/A 01/13/2013   Procedure: LUMBAR  WOUND DEBRIDEMENT;  Surgeon: Victory DELENA Gunnels, MD;  Location: [facility];  Service: Neurosurgery;  Laterality: N/A;  Lumbar Wound Exploration,  Attempted Placement of Cerebrospinal Drain, and Repair of CSF Leak   ROBOT ASSITED LAPAROSCOPIC NEPHROURETERECTOMY Left  05/10/2022   Procedure: XI ROBOT ASSITED LAPAROSCOPIC NEPHROURETERECTOMY;  Surgeon: Alvaro Hummer, MD;  Location: [facility];  Service: Urology;  Laterality: Left;   TOOTH EXTRACTION     TRANSURETHRAL RESECTION OF BLADDER TUMOR  07-05-2009;   11-22-2009   bladder cancer   TRANSURETHRAL RESECTION OF BLADDER TUMOR N/A 12/13/2012   Procedure: TRANSURETHRAL RESECTION OF BLADDER TUMOR (TURBT) AND INSTILLATION OF MYTOMYCIN;  Surgeon: Mark C Ottelin, MD;  Location: [facility];  Service: Urology;  Laterality: N/A;   TRANSURETHRAL RESECTION OF BLADDER TUMOR N/A 04/27/2023   Procedure: TRANSURETHRAL RESECTION OF BLADDER TUMOR (TURBT);  Surgeon: Alvaro Hummer KATHEE Mickey., MD;  Location: [facility];  Service: Urology;  Laterality: N/A;   TRANSURETHRAL RESECTION OF BLADDER TUMOR WITH GYRUS (TURBT-GYRUS) N/A 04/04/2013   Procedure: TRANSURETHRAL RESECTION OF BLADDER TUMOR WITH GYRUS (TURBT-GYRUS);  Surgeon: Mark C Ottelin, MD;  Location: [facility];  Service: Urology;  Laterality: N/A;   HPI:  Harry Young is a 68 y.o. male with medical history significant of anxiety, osteoarthritis, COPD, asthma as a child, bilateral carotid stenosis, constipation, angina, essential hypertension, right golfers elbow, heart murmur, nephrolithiasis, transitional cell bladder cancer, urothelial carcinoma with left kidney nephrectomy, hypothyroidism, spinal stenosis of lumbar spine, lumbar radiculopathy, nonrheumatic aortic valve insufficiency, nonrheumatic aortic valve stenosis, vitamin D deficiency who presented to the emergency department with dyspnea and hypoxia of 70%.   According to his wife he has been coughing, wheezing and progressively more dyspneic in the past 3 days.  He was getting presurgical evaluation when he got severely dyspneic and had to come to the emergency department.  Most recent CTA of the chest was showing:  Fluid level layering in the right main bronchus concerning for  aspiration.  5.  Diffuse bronchial thickening with segmental and scattered subsegmental bronchial impactions in the right lower lobe. No focal pneumonia is evident.  6. Multiple new bilateral pulmonary nodules highly worrisome for pulmonary metastases. 7. Emphysema.    Assessment / Plan / Recommendation  Clinical Impression  Clinical swallowing evaluation was completed in setting of prolonged intubation.  Patient currently with Cortrak in place for hydration/nutrition/medication.  RN approved patient for PO intake.  The patient does not endorse any trouble swallowing prior to admission.  Cranial nerve exam was completed and unremarkable.  Lingual, labial, facial and jaw range of motion and strength appeared to be adequate.  Facial sensation appeared to be intact and he did not endorse a difference in sensation between the right and left side of his face.  He was presented with thin liquids via spoon, cup and straw, pureed material and dry solids. He was able to self feed.  Obvious s/s of oral and pharyngeal dysphagia were not seen.  Mastication of dry solids appeared adequate with good oral clearance.  Swallow trigger was appreciated to palpation and overt s/s of aspiration were not seen.  Only issues noted was inability to continuously drink 3 ounces of water  to completion.  Suggest beginning a regular diet with thin liquids.  MBS to be completed tomorrow to fully assess swallowing physiology and safety.  Patient was agreeable for procedure. RN notified to make patient NPO if he has any decline in his respiratory status. SLP Visit Diagnosis: Dysphagia,  unspecified (R13.10)    Aspiration Risk  Mild aspiration risk    Diet Recommendation Regular;Thin liquid    Liquid Administration via: Cup;Straw Medication Administration: Whole meds with liquid Supervision: Patient able to self feed Postural Changes: Seated upright at 90 degrees    Other  Recommendations Oral Care Recommendations: Oral care BID        Functional  Status Assessment Patient has had a recent decline in their functional status and demonstrates the ability to make significant improvements in function in a reasonable and predictable amount of time.         Prognosis Prognosis for improved oropharyngeal function: Good      Swallow Study   General Date of Onset: 11/22/23 HPI: Harry Young is a 68 y.o. male with medical history significant of anxiety, osteoarthritis, COPD, asthma as a child, bilateral carotid stenosis, constipation, angina, essential hypertension, right golfers elbow, heart murmur, nephrolithiasis, transitional cell bladder cancer, urothelial carcinoma with left kidney nephrectomy, hypothyroidism, spinal stenosis of lumbar spine, lumbar radiculopathy, nonrheumatic aortic valve insufficiency, nonrheumatic aortic valve stenosis, vitamin D deficiency who presented to the emergency department with dyspnea and hypoxia of 70%.   According to his wife he has been coughing, wheezing and progressively more dyspneic in the past 3 days.  He was getting presurgical evaluation when he got severely dyspneic and had to come to the emergency department.  Most recent CTA of the chest was showing:  Fluid level layering in the right main bronchus concerning for  aspiration.  5. Diffuse bronchial thickening with segmental and scattered subsegmental bronchial impactions in the right lower lobe. No focal pneumonia is evident.  6. Multiple new bilateral pulmonary nodules highly worrisome for pulmonary metastases. 7. Emphysema. Type of Study: Bedside Swallow Evaluation Previous Swallow Assessment: None noted at [facility] or in care everwhere. Diet Prior to this Study: Cortrak/Small bore NG tube Temperature Spikes Noted: No Respiratory Status: Nasal cannula History of Recent Intubation: Yes Total duration of intubation (days): 2 days Date extubated: 11/24/23 Behavior/Cognition: Alert;Cooperative Oral Cavity Assessment: Within Functional Limits Oral Care  Completed by SLP: No Oral Cavity - Dentition: Dentures, top;Dentures, bottom Vision: Functional for self-feeding Self-Feeding Abilities: Able to feed self Patient Positioning: Upright in bed Baseline Vocal Quality: Low vocal intensity Volitional Swallow: Able to elicit    Oral/Motor/Sensory Function Overall Oral Motor/Sensory Function: Within functional limits   Ice Chips Ice chips: Not tested   Thin Liquid Thin Liquid: Within functional limits Presentation: Cup;Spoon;Straw;Self Fed    Nectar Thick Nectar Thick Liquid: Not tested   Honey Thick Honey Thick Liquid: Not tested   Puree Puree: Within functional limits Presentation: Spoon   Solid     Solid: Within functional limits Presentation: Self Fed      Eleanor Eagles, MA, CCC-SLP Acute Rehab SLP [phone]  Eleanor LOISE Eagles 11/25/2023,7:41 AM

## 2023-11-26 ENCOUNTER — Inpatient Hospital Stay (HOSPITAL_COMMUNITY)

## 2023-11-26 DIAGNOSIS — R1310 Dysphagia, unspecified: Secondary | ICD-10-CM

## 2023-11-26 DIAGNOSIS — R918 Other nonspecific abnormal finding of lung field: Secondary | ICD-10-CM | POA: Diagnosis not present

## 2023-11-26 DIAGNOSIS — J9601 Acute respiratory failure with hypoxia: Secondary | ICD-10-CM | POA: Diagnosis not present

## 2023-11-26 DIAGNOSIS — J441 Chronic obstructive pulmonary disease with (acute) exacerbation: Secondary | ICD-10-CM | POA: Diagnosis not present

## 2023-11-26 LAB — PHOSPHORUS: Phosphorus: 2.7 mg/dL (ref 2.5–4.6)

## 2023-11-26 LAB — GLUCOSE, CAPILLARY
Glucose-Capillary: 100 mg/dL — ABNORMAL HIGH (ref 70–99)
Glucose-Capillary: 103 mg/dL — ABNORMAL HIGH (ref 70–99)
Glucose-Capillary: 108 mg/dL — ABNORMAL HIGH (ref 70–99)
Glucose-Capillary: 110 mg/dL — ABNORMAL HIGH (ref 70–99)
Glucose-Capillary: 111 mg/dL — ABNORMAL HIGH (ref 70–99)
Glucose-Capillary: 111 mg/dL — ABNORMAL HIGH (ref 70–99)
Glucose-Capillary: 114 mg/dL — ABNORMAL HIGH (ref 70–99)
Glucose-Capillary: 119 mg/dL — ABNORMAL HIGH (ref 70–99)
Glucose-Capillary: 125 mg/dL — ABNORMAL HIGH (ref 70–99)
Glucose-Capillary: 125 mg/dL — ABNORMAL HIGH (ref 70–99)
Glucose-Capillary: 130 mg/dL — ABNORMAL HIGH (ref 70–99)
Glucose-Capillary: 137 mg/dL — ABNORMAL HIGH (ref 70–99)
Glucose-Capillary: 138 mg/dL — ABNORMAL HIGH (ref 70–99)
Glucose-Capillary: 144 mg/dL — ABNORMAL HIGH (ref 70–99)
Glucose-Capillary: 147 mg/dL — ABNORMAL HIGH (ref 70–99)
Glucose-Capillary: 153 mg/dL — ABNORMAL HIGH (ref 70–99)
Glucose-Capillary: 90 mg/dL (ref 70–99)
Glucose-Capillary: 183 mg/dL — ABNORMAL HIGH (ref 70–99)

## 2023-11-26 LAB — BASIC METABOLIC PANEL WITH GFR
Anion gap: 10 (ref 5–15)
BUN: 45 mg/dL — ABNORMAL HIGH (ref 8–23)
CO2: 24 mmol/L (ref 22–32)
Calcium: 9 mg/dL (ref 8.9–10.3)
Chloride: 109 mmol/L (ref 98–111)
Creatinine, Ser: 1.19 mg/dL (ref 0.61–1.24)
GFR, Estimated: >60 mL/min (ref >60)
Glucose, Bld: 137 mg/dL — ABNORMAL HIGH (ref 70–99)
Potassium: 4.7 mmol/L (ref 3.5–5.1)
Sodium: 143 mmol/L (ref 135–145)

## 2023-11-26 LAB — CBC
HCT: 36.3 % — ABNORMAL LOW (ref 39.0–52.0)
Hemoglobin: 11.5 g/dL — ABNORMAL LOW (ref 13.0–17.0)
MCH: 32.6 pg (ref 26.0–34.0)
MCHC: 31.7 g/dL (ref 30.0–36.0)
MCV: 102.8 fL — ABNORMAL HIGH (ref 80.0–100.0)
Platelets: 148 K/uL — ABNORMAL LOW (ref 150–400)
RBC: 3.53 MIL/uL — ABNORMAL LOW (ref 4.22–5.81)
RDW: 14.7 % (ref 11.5–15.5)
WBC: 13.9 K/uL — ABNORMAL HIGH (ref 4.0–10.5)
nRBC: 0 % (ref 0.0–0.2)

## 2023-11-26 LAB — MAGNESIUM: Magnesium: 2.6 mg/dL — ABNORMAL HIGH (ref 1.7–2.4)

## 2023-11-26 MED ORDER — IPRATROPIUM-ALBUTEROL 0.5-2.5 (3) MG/3ML IN SOLN
3.0000 mL | RESPIRATORY_TRACT | Status: DC | PRN
  Administered 2023-11-28: 3 mL via RESPIRATORY_TRACT
  Filled 2023-11-26: qty 3

## 2023-11-26 MED ORDER — DOCUSATE SODIUM 50 MG/5ML PO LIQD
100.0000 mg | Freq: Two times a day (BID) | ORAL | Status: DC
  Administered 2023-11-26 – 2023-11-27 (×2): 100 mg via ORAL
  Filled 2023-11-26 (×3): qty 10

## 2023-11-26 MED ORDER — ORAL CARE MOUTH RINSE: 15.0000 mL | OROMUCOSAL | Status: DC | PRN

## 2023-11-26 MED ORDER — PREDNISONE 20 MG PO TABS
40.0000 mg | ORAL_TABLET | Freq: Every day | ORAL | Status: DC
  Administered 2023-11-27: 40 mg via ORAL
  Filled 2023-11-26: qty 2

## 2023-11-26 MED ORDER — THIAMINE MONONITRATE 100 MG PO TABS
100.0000 mg | ORAL_TABLET | Freq: Every day | ORAL | Status: AC
  Administered 2023-11-27: 100 mg via ORAL
  Filled 2023-11-26: qty 1

## 2023-11-26 MED ORDER — ENSURE PLUS HIGH PROTEIN PO LIQD
237.0000 mL | Freq: Two times a day (BID) | ORAL | Status: DC
  Administered 2023-11-26 – 2023-11-28 (×4): 237 mL via ORAL

## 2023-11-26 MED ORDER — METHYLPREDNISOLONE SODIUM SUCC 40 MG IJ SOLR: 40.0000 mg | Freq: Every day | INTRAMUSCULAR | Status: DC

## 2023-11-26 MED ORDER — LEVOTHYROXINE SODIUM 50 MCG PO TABS
75.0000 ug | ORAL_TABLET | Freq: Every day | ORAL | Status: DC
  Administered 2023-11-27 – 2023-11-28 (×2): 75 ug via ORAL
  Filled 2023-11-26 (×2): qty 1

## 2023-11-26 MED ORDER — BUDESONIDE 0.5 MG/2ML IN SUSP
0.5000 mg | Freq: Two times a day (BID) | RESPIRATORY_TRACT | Status: DC
  Administered 2023-11-26 – 2023-11-27 (×3): 0.5 mg via RESPIRATORY_TRACT
  Filled 2023-11-26 (×3): qty 2

## 2023-11-26 NOTE — Progress Notes (Signed)
   11/26/23 0006  BiPAP/CPAP/SIPAP  $ Non-Invasive Ventilator  Non-Invasive Vent Subsequent  BiPAP/CPAP/SIPAP Pt Type Adult  BiPAP/CPAP/SIPAP SERVO  Mask Type Full face mask  Dentures removed? Not applicable  Mask Size Medium  Set Rate 15 breaths/min  Respiratory Rate 17 breaths/min  IPAP 10 cmH20  EPAP 5 cmH2O  Pressure Support 5 cmH20  PEEP 5 cmH20  FiO2 (%) 30 %  Minute Ventilation 9.7  Leak 67  Peak Inspiratory Pressure (PIP) 9  Tidal Volume (Vt) 579  Patient Home Machine No  Patient Home Mask No  Patient Home Tubing No  Auto Titrate No  Press High Alarm 25 cmH2O  CPAP/SIPAP surface wiped down Yes  Device Plugged into RED Power Outlet Yes  Oxygen Percent 30 %  BiPAP/CPAP /SiPAP Vitals  Temp 98.6 F (37 C)  Pulse Rate 84  Resp 18  SpO2 100 %  MEWS Score/Color  MEWS Score 1  MEWS Score Color Green

## 2023-11-26 NOTE — Progress Notes (Signed)
 [facility]                                                                               Harry Young, is a 68 y.o. male, DOB - 23-Sep-1955, FMW:[id number] Admit date - 11/22/2023    Outpatient Primary MD for the patient is Corlis Pagan, NP  LOS - 4  days    Brief summary    68 year old male patient followed previously in our office, with known history of COPD and active tobacco abuse.  He is currently being treated by urology with plan for bladder surgery initially scheduled for 7/11 for his known bladder cancer.   Presented to presurgical testing on 7/10 with plan for TURBT to be done by Dr. Christopher on the 11th.  Noting 3 to 4 days of worsening cough with associated shortness of breath he appeared pale on presentation, tachycardic, room air saturations were 86% and he was subsequently referred to the emergency room for further evaluation.  On arrival from preanesthesia to the ER pulse oximetry is in the 70s to 80s breathing in a tripod position placed on 15 L with improvement. He was subsequently started on NIPPV.  Initial evaluation in the emergency revealed the following: Venous blood gas with pH of 7.13 pCO2 of 86 pO2 of 51 HCO3 29 serum creatinine 1.32 this is around his baseline white blood cell count 22.3.  He was admitted by PCCM and intubated for AECOPD and pneumonia. He was subsequently extubated on 7/12 and transferred to [facility] on 7/13 Today he started having restless legs, became tachycardic and anxious. As per the wife, he takes hydrocodone  10 mg every 8 hours at home. He is requesting medications for RLS and pain.   Assessment & Plan    Assessment and Plan:   Acute hypoxic and hypercarbic respiratory failure secondary to COPD Continue with IV antibiotics to complete the course and IV Solu-Medrol . Continue with long-acting bronchodilators and BiPAP as needed.  He is currently on 2 L of nasal cannula oxygen Continue with BIPAP.  He appears to be improving. Will  transition to oral prednisone  today.    Opioid withdrawals and RLS Restarted home meds  Added morphine  and robaxin .  Pain well controlled.     Septic shock sec to CAP Off pressors and d/c central line.     Acute metabolic encephalopathy due to hypercapnia Resolved, he is alert and oriented.  Therapy evaluations done.    Acute on 3A CKD Creatinine improved. Continue to monitor   Severe protein calorie malnutrition due to malignancy Nutritional consult will be consulted    Multifocal urothelial carcinoma Recommend outpatient follow-up with urology Hematuria persistent , will keep the foley in place.    Dysphagia Patient currently on dysphagia 3 diet.    RN Pressure Injury Documentation:    Malnutrition Type:  Nutrition Problem: Severe Malnutrition Etiology: chronic illness (COPD)   Malnutrition Characteristics:  Signs/Symptoms: severe fat depletion, severe muscle depletion   Nutrition Interventions:  Interventions: Refer to RD note for recommendations, Tube feeding  Estimated body mass index is 19.11 kg/m as calculated from the following:   Height as of this encounter: 5' 9 (1.753 m).   Weight  as of this encounter: 58.7 kg.  Code Status: DNR  DVT Prophylaxis:  heparin  injection 5,000 Units Start: 11/23/23 0915 SCDs Start: 11/22/23 1508   Level of Care: Level of care: Stepdown Family Communication: family at bedside.   Disposition Plan:     Remains inpatient appropriate:  pending clinical improvement.   Procedures:  S/p extubation on 7/12   Consultants:   PCCM.   Antimicrobials:   Anti-infectives (From admission, onward)    Start     Dose/Rate Route Frequency Ordered Stop   11/22/23 1600  azithromycin  (ZITHROMAX ) 500 mg in sodium chloride  0.9 % 250 mL IVPB  Status:  Discontinued        500 mg 250 mL/hr over 60 Minutes Intravenous Every 24 hours 11/22/23 1538 11/25/23 0848   11/22/23 1530  cefTRIAXone  (ROCEPHIN ) 2 g in sodium  chloride 0.9 % 100 mL IVPB        2 g 200 mL/hr over 30 Minutes Intravenous Every 24 hours 11/22/23 1539          Medications  Scheduled Meds:  arformoterol   15 mcg Nebulization BID   Chlorhexidine  Gluconate Cloth  6 each Topical Daily   docusate  100 mg Per Tube BID   heparin  injection (subcutaneous)  5,000 Units Subcutaneous Q8H   insulin  aspart  0-9 Units Subcutaneous Q4H   ipratropium-albuterol   3 mL Nebulization QID   ipratropium-albuterol   3 mL Nebulization Once   methylPREDNISolone  (SOLU-MEDROL ) injection  40 mg Intravenous Q12H   nicotine   21 mg Transdermal Daily   mouth rinse  15 mL Mouth Rinse 4 times per day   oxidized cellulose  1 each Topical Once   polyethylene glycol  17 g Per Tube Daily   revefenacin   175 mcg Nebulization Daily   rOPINIRole   1 mg Oral TID   sodium chloride  flush  10-40 mL Intracatheter Q12H   thiamine   100 mg Per Tube Daily   Continuous Infusions:  cefTRIAXone  (ROCEPHIN )  IV Stopped (11/25/23 1513)   feeding supplement (VITAL AF 1.2 CAL) 25 mL/hr at 11/26/23 0100   PRN Meds:.acetaminophen  **OR** acetaminophen , docusate sodium , HYDROcodone -acetaminophen , LORazepam , methocarbamol  (ROBAXIN ) injection, morphine  injection, ondansetron  **OR** ondansetron  (ZOFRAN ) IV, mouth rinse, polyethylene glycol, sodium chloride  flush    Subjective:   Harry Young was seen and examined today.  Comfortable.   Objective:   Vitals:   11/26/23 0311 11/26/23 0400 11/26/23 0700 11/26/23 0800  BP:  (!) 121/45 (!) 129/38 (!) 155/51  Pulse: (!) 58 72 (!) 50 69  Resp: 13 14 13 16   Temp: 98.6 F (37 C) 98.6 F (37 C) 98.6 F (37 C) 98.6 F (37 C)  TempSrc:  Bladder    SpO2: 100% 99% 100% 99%  Weight:      Height:        Intake/Output Summary (Last 24 hours) at 11/26/2023 0856 Last data filed at 11/26/2023 0800 Gross per 24 hour  Intake 644.25 ml  Output 1350 ml  Net -705.75 ml   Filed Weights   11/24/23 0457 11/25/23 0500 11/26/23 0141  Weight: 62.6  kg 59.9 kg 58.7 kg     Exam General exam: Appears calm and comfortable  Respiratory system: Clear to auscultation. Respiratory effort normal. On 2 lit of [place] oxygen. Cardiovascular system: S1 & S2 heard, RRR. No JVD,  Gastrointestinal system: Abdomen is nondistended, soft and nontender.  Central nervous system: Alert and oriented. Extremities: Symmetric 5 x 5 power. Skin: No rashes,  Psychiatry: Mood & affect appropriate.  Data Reviewed:  I have personally reviewed following labs and imaging studies   CBC Lab Results  Component Value Date   WBC 18.1 (H) 11/25/2023   RBC 3.65 (L) 11/25/2023   HGB 11.8 (L) 11/25/2023   HCT 36.2 (L) 11/25/2023   MCV 99.2 11/25/2023   MCH 32.3 11/25/2023   PLT 170 11/25/2023   MCHC 32.6 11/25/2023   RDW 14.8 11/25/2023   LYMPHSABS 2.3 01/12/2013   MONOABS 1.1 (H) 01/12/2013   EOSABS 0.0 01/12/2013   BASOSABS 0.0 01/12/2013     Last metabolic panel Lab Results  Component Value Date   NA 140 11/25/2023   K 4.4 11/25/2023   CL 104 11/25/2023   CO2 23 11/25/2023   BUN 42 (H) 11/25/2023   CREATININE 1.51 (H) 11/25/2023   GLUCOSE 123 (H) 11/25/2023   GFRNONAA 50 (L) 11/25/2023   GFRAA 86 (L) 01/17/2013   CALCIUM 9.0 11/25/2023   PHOS 4.1 11/25/2023   PROT 6.5 11/23/2023   ALBUMIN 3.5 11/23/2023   BILITOT 0.9 11/23/2023   ALKPHOS 84 11/23/2023   AST 19 11/23/2023   ALT 15 11/23/2023   ANIONGAP 13 11/25/2023    CBG (last 3)  Recent Labs    11/25/23 2310 11/26/23 0334 11/26/23 0735  GLUCAP 125* 114* 130*      Coagulation Profile: No results for input(s): INR, PROTIME in the last 168 hours.   Radiology Studies: DG Abd 1 View Result Date: 11/24/2023 CLINICAL DATA:  Nasogastric tube placement EXAM: ABDOMEN - 1 VIEW COMPARISON:  11/23/2023 FINDINGS: Tip of the weighted enteric tube below the diaphragm just to the left of midline in the region of the mid stomach. The previous non weighted enteric tube is no longer  seen. No evidence of bowel obstruction in the included upper abdomen. IMPRESSION: Tip of the weighted enteric tube below the diaphragm just to the left of midline in the region of the mid stomach. Electronically Signed   By: Andrea Gasman M.D.   On: 11/24/2023 19:45       Elgie Butter M.D. [facility] 11/26/2023, 8:56 AM  Available via Epic secure chat 7am-7pm After 7 pm, please refer to night coverage provider listed on amion.

## 2023-11-26 NOTE — Plan of Care (Signed)
  Problem: Activity: Goal: Will verbalize the importance of balancing activity with adequate rest periods Outcome: Progressing   Problem: Respiratory: Goal: Ability to maintain a clear airway will improve Outcome: Progressing Goal: Levels of oxygenation will improve Outcome: Progressing Goal: Ability to maintain adequate ventilation will improve Outcome: Progressing   Problem: Clinical Measurements: Goal: Will remain free from infection Outcome: Progressing Goal: Diagnostic test results will improve Outcome: Progressing Goal: Respiratory complications will improve Outcome: Progressing Goal: Cardiovascular complication will be avoided Outcome: Progressing

## 2023-11-26 NOTE — Progress Notes (Signed)
 Nutrition Follow-up  DOCUMENTATION CODES:   Severe malnutrition in context of chronic illness  INTERVENTION:  - Plan to stop tube feeds and remove Cortrak.  - DYS 3 diet per SLP.  - Ensure Plus High Protein po BID, each supplement provides 350 kcal and 20 grams of protein.   - Magic cup TID with meals, each supplement provides 290 kcal and 9 grams of protein.  - Monitor magnesium , potassium, and phosphorus daily for at least 3 days, MD to replete as needed. Patient likely remains at risk for refeeding.  - Monitor weight trends.    NUTRITION DIAGNOSIS:   Severe Malnutrition related to chronic illness (COPD) as evidenced by severe fat depletion, severe muscle depletion. *ongoing  GOAL:   Patient will meet greater than or equal to 90% of their needs *progressing  MONITOR:   Vent status, Labs, Weight trends, TF tolerance  REASON FOR ASSESSMENT:   Consult Enteral/tube feeding initiation and management  ASSESSMENT:   68 y.o. male with PMH significant of anxiety, osteoarthritis, COPD, bilateral carotid stenosis, constipation, angina, essential hypertension, transitional cell bladder cancer, urothelial carcinoma with left kidney nephrectomy, hypothyroidism, spinal stenosis of lumbar spine, nonrheumatic aortic valve stenosis, vitamin D deficiency who presented with dyspnea and hypoxia. Admitted for acute COPD exacerbation.  7/10 Admit; Intubated  7/11 TF initiated 7/12 Extubated; Cortrak placed  7/13 SLP eval -> Regular diet 7/14 MBS -> DYS 3 diet  Patient in bed at time of visit, TF infusing at 41mL/hr. Wife at bedside.   Patient was extubated 7/12 but failed bedside swallow so cortrak placed. TF's resumed but were never advanced to goal.  Wife reports the patient typically only eats 1 meal a day at home. She often cannot get him to drink any ONS.  She is relieved his diet is able to be advanced. Discussed plan to add Ensure and patient also agreeable to receive Magic  Cup. Encouraged patient to still order 3 meals a day and eat as much as able in addition to ONS.  Both are eager to have Cortrak removed.   Discussed patient with MD and RN. RN reports patient did well eating lunch and per [facility] has consumed 2 Ensures already today. MD on board to remove Cortrak and encourage oral intake.     Admit weight: 120# Current weight: 129# I&O's: +2.1L since admit  Medications reviewed and include: Colace, Miralax , 100mg  thiamine  (ends tomorrow)  Labs reviewed:  Magnesium  2.6  Diet Order:   Diet Order             DIET DYS 3 Room service appropriate? Yes; Fluid consistency: Thin  Diet effective now                   EDUCATION NEEDS:  Education needs have been addressed  Skin:  Skin Assessment: Reviewed RN Assessment  Last BM:  PTA  Height:  Ht Readings from Last 1 Encounters:  11/23/23 5' 9 (1.753 m)   Weight:  Wt Readings from Last 1 Encounters:  11/26/23 58.7 kg   Ideal Body Weight:  72.73 kg  BMI:  Body mass index is 19.11 kg/m.  Estimated Nutritional Needs:  Kcal:  1600-1800 kcals Protein:  85-115 grams Fluid:  >/= 1.6L    Trude Ned RD, LDN Contact via Secure Chat.

## 2023-11-26 NOTE — Procedures (Signed)
 Modified Barium Swallow Study  Patient Details  Name: Harry Young MRN: [id number] Date of Birth: 02-12-1956  Today's Date: 11/26/2023  Modified Barium Swallow completed.  Full report located under Chart Review in the Imaging Section.  History of Present Illness Harry Young is a 68 y.o. male with medical history significant of anxiety, osteoarthritis, COPD, asthma as a child, bilateral carotid stenosis, constipation, angina, essential hypertension, right golfers elbow, heart murmur, nephrolithiasis, transitional cell bladder cancer, urothelial carcinoma with left kidney nephrectomy, hypothyroidism, spinal stenosis of lumbar spine, lumbar radiculopathy, nonrheumatic aortic valve insufficiency, nonrheumatic aortic valve stenosis, vitamin D deficiency who presented to the emergency department with dyspnea and hypoxia of 70%.   According to his wife he has been coughing, wheezing and progressively more dyspneic in the past 3 days.  He was getting presurgical evaluation when he got severely dyspneic and had to come to the emergency department.  Most recent CTA of the chest was showing:  Fluid level layering in the right main bronchus concerning for  aspiration.  5. Diffuse bronchial thickening with segmental and scattered subsegmental bronchial impactions in the right lower lobe. No focal pneumonia is evident.  6. Multiple new bilateral pulmonary nodules highly worrisome for pulmonary metastases. 7. Emphysema.   Clinical Impression Patient presents with a primary pharyngoesophageal dyshpagia as per this modified barium swallow study. No penetration or aspiration observed with any of the liquid or solid consistencies that were tested. Anterior hyoid excursion was partial in completion but epiglottic inversion and laryngeal elevation were both complete. Patient exhibited pharyngeal residuals s/p initial swallow in pyriform sinus with liquids and solids. Subsequent dry swallows helped to clear residuals.  13mm barium tablet become lodged above PES when taken with thin liquids. Puree solids helped to transit tablet through esophagus and although transit remained slow, tablet did transit through distal esophagus. After MBS, patient did exhibit instances of hiccup which was mild in intensity and frequency. SLP recommending mechanical soft solids, thin liquids and medications be taken whole in puree/pudding. Factors that may increase risk of adverse event in presence of aspiration Noe [employer] Lianne 2021): Poor general health and/or compromised immunity;Presence of tubes (ETT, trach, NG, etc.);Frail or deconditioned  Swallow Evaluation Recommendations Recommendations: PO diet PO Diet Recommendation: Dysphagia 3 (Mechanical soft);Thin liquids (Level 0) Liquid Administration via: Cup;Straw Medication Administration: Whole meds with puree Supervision: Patient able to self-feed Swallowing strategies  : Slow rate;Small bites/sips;Multiple dry swallows after each bite/sip;Follow solids with liquids Postural changes: Position pt fully upright for meals;Stay upright 30-60 min after meals Oral care recommendations: Oral care BID (2x/day)      Norleen IVAR Blase, MA, CCC-SLP Speech Therapy

## 2023-11-26 NOTE — Progress Notes (Addendum)
 NAME:  Harry Young, MRN:  [id number], DOB:  02-Dec-1955, LOS: 4 ADMISSION DATE:  11/22/2023, CONSULTATION DATE: 7/10 REFERRING MD: Jonell, CHIEF COMPLAINT: Respiratory failure  History of Present Illness:  68 year old male patient followed previously in our office, with known history of COPD and active tobacco abuse.  He is currently being treated by urology with plan for bladder surgery initially scheduled for 7/11 for his known bladder cancer.  Presented to presurgical testing on 7/10 with plan for TURBT to be done by Dr. Christopher on the 11th.  Noting 3 to 4 days of worsening cough with associated shortness of breath he appeared pale on presentation, tachycardic, room air saturations were 86% and he was subsequently referred to the emergency room for further evaluation.  On arrival from preanesthesia to the ER pulse oximetry is in the 70s to 80s breathing in a tripod position placed on 15 L with improvement. He was subsequently started on NIPPV.  Initial evaluation in the emergency revealed the following: Venous blood gas with pH of 7.13 pCO2 of 86 pO2 of 51 HCO3 29 serum creatinine 1.32 this is around his baseline white blood cell count 22.3.  Portable chest x-ray was clear without focal infiltrates, was hyper distended.   Critical care asked to evaluate.   Pertinent  Medical History  Tobacco abuse, emphysema/COPD, bladder cancer, left upper lobe spiculated lung mass, hearing loss, bilateral carotid artery stenosis, coronary artery disease, hypertension, hypothyroidism, mixed hyperlipidemia  Significant Hospital Events: Including procedures, antibiotic start and stop dates in addition to other pertinent events   7/10: pre-op>distress>ER, put on NIPPV. CXR no infiltrate> admit ICU for AECOPD, pneumonia; required intubation. 7/12 extubated to bipap; failed swallow eval. Cortrak placed.   Interim History / Subjective:  Yesterday per patient walked around with PT in unit Wore bipap  overnight  Today doing well on 2L [place] sats 100%  Objective    Blood pressure (!) 155/51, pulse 69, temperature 98.6 F (37 C), resp. rate 16, height 5' 9 (1.753 m), weight 58.7 kg, SpO2 99%. CVP:  [0 mmHg-24 mmHg] 0 mmHg  FiO2 (%):  [30 %] 30 % PEEP:  [5 cmH20] 5 cmH20 Pressure Support:  [5 cmH20] 5 cmH20   Intake/Output Summary (Last 24 hours) at 11/26/2023 0905 Last data filed at 11/26/2023 0800 Gross per 24 hour  Intake 644.25 ml  Output 1350 ml  Net -705.75 ml   Filed Weights   11/24/23 0457 11/25/23 0500 11/26/23 0141  Weight: 62.6 kg 59.9 kg 58.7 kg    Examination: General: NAD HEENT: MM pink/moist; [place] in place Neuro: Aox3; MAE CV: s1s2, RRR, no m/r/g PULM:  dim clear BS bilaterally; [place] 2L GI: soft, bsx4 active  Extremities: warm/dry, no edema   Resolved problem list   Hypophosphatemia ; resolved Septic shock  Assessment and Plan   Acute hypoxic and hypercarbic respiratory failure 2/2 AECOPD COPD  Multiple solid subcentimeter peripheral pulmonary nodules; likely lung mets Possible CAP Plan: -cont [place] and wean for sats >92% -prn bipap -pulm toiletry -pt/ot -complete course of rocephin  today -follow cultures -cont yupelri  and brovana ; add pulmicort ; prn duoneb -currently on iv steroids; consider switching to po  Acute metabolic encephalopathy due to hypercapnia-resolvedT CKD 3a Bladder cancer, 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 Severe protein calorie malnutrition due to malignancy Hyperglycemia  Concern for dysphagia, may be due to dyspnea from COPD Plan: -per primary  Best Practice (right click and Reselect all SmartList Selections daily)   Per primary  Labs  CBC: Recent Labs  Lab 11/22/23 1148 11/23/23 0451 11/24/23 0455 11/25/23 0330  WBC 22.3* 17.9* 19.4* 18.1*  HGB 15.4 12.9* 11.5* 11.8*  HCT 47.4 38.8* 34.7* 36.2*  MCV 98.3 96.8 97.2 99.2  PLT 221 194 150 170    Basic Metabolic Panel: Recent Labs  Lab  11/22/23 1148 11/22/23 1640 11/23/23 0451 11/23/23 0823 11/23/23 1640 11/24/23 0455 11/24/23 1653 11/25/23 0330  NA 139  --  135  --   --   --   --  140  K 4.3  --  3.4*  --   --   --   --  4.4  CL 101  --  101  --   --   --   --  104  CO2 26  --  25  --   --   --   --  23  GLUCOSE 152*  --  260*  --   --   --   --  123*  BUN 18  --  31*  --   --   --   --  42*  CREATININE 1.32*  --  1.66*  --   --   --   --  1.51*  CALCIUM 9.6  --  9.1  --   --   --   --  9.0  MG  --    < > 2.4 2.3 2.2 2.2 2.3 2.5*  PHOS  --    < > <1.0* 1.7* 4.0 4.1 4.9* 4.1   < > = values in this interval not displayed.   GFR: Estimated Creatinine Clearance: 38.3 mL/min (A) (by C-G formula based on SCr of 1.51 mg/dL (H)). Recent Labs  Lab 11/22/23 1148 11/22/23 1640 11/22/23 1838 11/23/23 0451 11/23/23 0823 11/24/23 0455 11/25/23 0330  PROCALCITON  --  2.63  --  6.70  --  4.87 2.30  WBC 22.3*  --   --  17.9*  --  19.4* 18.1*  LATICACIDVEN  --  5.5* 4.5*  --  1.4  --   --     Critical care time: NA     JD Emilio DEVONNA Finn [facility] 11/26/2023, 9:14 AM  Please see [email or web address] for pager details.  From 7A-7P if no response, please call [phone]. After hours, please call ELink [phone].

## 2023-11-27 ENCOUNTER — Telehealth: Payer: Self-pay | Admitting: Physician Assistant

## 2023-11-27 DIAGNOSIS — R1310 Dysphagia, unspecified: Secondary | ICD-10-CM | POA: Diagnosis not present

## 2023-11-27 DIAGNOSIS — J441 Chronic obstructive pulmonary disease with (acute) exacerbation: Secondary | ICD-10-CM | POA: Diagnosis not present

## 2023-11-27 DIAGNOSIS — J9601 Acute respiratory failure with hypoxia: Secondary | ICD-10-CM | POA: Diagnosis not present

## 2023-11-27 DIAGNOSIS — R918 Other nonspecific abnormal finding of lung field: Secondary | ICD-10-CM | POA: Diagnosis not present

## 2023-11-27 LAB — GLUCOSE, CAPILLARY
Glucose-Capillary: 136 mg/dL — ABNORMAL HIGH (ref 70–99)
Glucose-Capillary: 140 mg/dL — ABNORMAL HIGH (ref 70–99)
Glucose-Capillary: 146 mg/dL — ABNORMAL HIGH (ref 70–99)
Glucose-Capillary: 95 mg/dL (ref 70–99)
Glucose-Capillary: 95 mg/dL (ref 70–99)
Glucose-Capillary: 112 mg/dL — ABNORMAL HIGH (ref 70–99)
Glucose-Capillary: 122 mg/dL — ABNORMAL HIGH (ref 70–99)

## 2023-11-27 LAB — BASIC METABOLIC PANEL WITH GFR
Anion gap: 7 (ref 5–15)
BUN: 49 mg/dL — ABNORMAL HIGH (ref 8–23)
CO2: 29 mmol/L (ref 22–32)
Calcium: 8.6 mg/dL — ABNORMAL LOW (ref 8.9–10.3)
Chloride: 104 mmol/L (ref 98–111)
Creatinine, Ser: 1.17 mg/dL (ref 0.61–1.24)
GFR, Estimated: >60 mL/min (ref >60)
Glucose, Bld: 99 mg/dL (ref 70–99)
Potassium: 4.4 mmol/L (ref 3.5–5.1)
Sodium: 140 mmol/L (ref 135–145)

## 2023-11-27 LAB — CBC
HCT: 33.1 % — ABNORMAL LOW (ref 39.0–52.0)
Hemoglobin: 10.7 g/dL — ABNORMAL LOW (ref 13.0–17.0)
MCH: 32.2 pg (ref 26.0–34.0)
MCHC: 32.3 g/dL (ref 30.0–36.0)
MCV: 99.7 fL (ref 80.0–100.0)
Platelets: 149 K/uL — ABNORMAL LOW (ref 150–400)
RBC: 3.32 MIL/uL — ABNORMAL LOW (ref 4.22–5.81)
RDW: 14.4 % (ref 11.5–15.5)
WBC: 13.9 K/uL — ABNORMAL HIGH (ref 4.0–10.5)
nRBC: 0 % (ref 0.0–0.2)

## 2023-11-27 MED ORDER — PREDNISONE 20 MG PO TABS: 20.0000 mg | ORAL_TABLET | Freq: Every day | ORAL | Status: DC

## 2023-11-27 MED ORDER — BUDESON-GLYCOPYRROL-FORMOTEROL 160-9-4.8 MCG/ACT IN AERO
2.0000 | INHALATION_SPRAY | Freq: Two times a day (BID) | RESPIRATORY_TRACT | Status: DC
  Administered 2023-11-27 – 2023-11-28 (×2): 2 via RESPIRATORY_TRACT
  Filled 2023-11-27 (×2): qty 5.9

## 2023-11-27 MED ORDER — GUAIFENESIN 100 MG/5ML PO LIQD
10.0000 mL | Freq: Three times a day (TID) | ORAL | Status: DC
  Administered 2023-11-27 – 2023-11-28 (×5): 10 mL via ORAL
  Filled 2023-11-27 (×5): qty 10

## 2023-11-27 MED ORDER — PREDNISONE 20 MG PO TABS: 30.0000 mg | ORAL_TABLET | Freq: Every day | ORAL | Status: DC

## 2023-11-27 MED ORDER — SODIUM CHLORIDE 3 % IN NEBU
4.0000 mL | INHALATION_SOLUTION | Freq: Every day | RESPIRATORY_TRACT | Status: DC
  Administered 2023-11-27 – 2023-11-28 (×2): 4 mL via RESPIRATORY_TRACT
  Filled 2023-11-27 (×2): qty 4

## 2023-11-27 MED ORDER — PREDNISONE 10 MG PO TABS: 10.0000 mg | ORAL_TABLET | Freq: Every day | ORAL | Status: DC

## 2023-11-27 MED ORDER — PREDNISONE 20 MG PO TABS
40.0000 mg | ORAL_TABLET | Freq: Every day | ORAL | Status: DC
  Administered 2023-11-28: 40 mg via ORAL
  Filled 2023-11-27: qty 2

## 2023-11-27 NOTE — Progress Notes (Signed)
 Occupational Therapy Treatment Patient Details Name: Harry Young MRN: [id number] DOB: 26-Sep-1955 Today's Date: 11/27/2023   History of present illness Pt admitted from home with respiratory failure 2* COPD exacerbation - intubated 11/22/23 and extubated 11/24/23.  Pt with hx of COPD, spinal stenosis, back surgeries, bladder CA, urothelial CA s/p L nephrectomy, aortic valve insufficiency, and HOH L ear.   OT comments  Patient seen for skilled OT session this am. Progressing well and open to all therapy presented. Able to amb with and some without RW in hallway with + VSR. OT educated on pacing, energy conservation and LB dressing training with Foley cath management. Patient requires continued Acute care hospital level OT services to progress safety and functional performance and allow for discharge. Recommending HHOT services with family assist and support upon discharge.        If plan is discharge home, recommend the following:  A little help with walking and/or transfers;A little help with bathing/dressing/bathroom   Equipment Recommendations  None recommended by OT       Precautions / Restrictions Precautions Precautions: Fall Recall of Precautions/Restrictions: Intact Precaution/Restrictions Comments: monitor sats Restrictions Weight Bearing Restrictions Per Provider Order: No       Mobility Bed Mobility Overal bed mobility: Needs Assistance Bed Mobility: Supine to Sit     Supine to sit: Contact guard     General bed mobility comments: for safety    Transfers Overall transfer level: Needs assistance Equipment used: Rolling walker (2 wheels) Transfers: Sit to/from Stand, Bed to chair/wheelchair/BSC Sit to Stand: Contact guard assist     Step pivot transfers: Contact guard assist     General transfer comment: min cues for hand placement     Balance Overall balance assessment: Mild deficits observed, not formally tested                                          ADL either performed or assessed with clinical judgement   ADL Overall ADL's : Needs assistance/impaired                     Lower Body Dressing: Contact guard assist;Sit to/from stand Lower Body Dressing Details (indicate cue type and reason): cues for dressing Foley bag into regulat pants and bridging bed level to pull up Toilet Transfer: Contact guard assist           Functional mobility during ADLs: Rolling walker (2 wheels);Contact guard assist General ADL Comments: LB dressing training with Foley bag    Extremity/Trunk Assessment Upper Extremity Assessment Upper Extremity Assessment: Generalized weakness   Lower Extremity Assessment Lower Extremity Assessment: Defer to PT evaluation                 Communication Communication Communication: No apparent difficulties Factors Affecting Communication: Hearing impaired   Cognition Arousal: Alert Behavior During Therapy: WFL for tasks assessed/performed, Impulsive Cognition: No apparent impairments             OT - Cognition Comments: flat affect, no impulsivity noted                 Following commands: Intact        Cueing   Cueing Techniques: Verbal cues, Gestural cues        General Comments no skin issues noted, HR 82 post amb and SpO2 99% on 3 ltrs O2 via [place]  Pertinent Vitals/ Pain       Pain Assessment Pain Assessment: No/denies pain   Frequency  Min 2X/week        Progress Toward Goals  OT Goals(current goals can now be found in the care plan section)  Progress towards OT goals: Progressing toward goals  Acute Rehab OT Goals Patient Stated Goal: to feel stronger OT Goal Formulation: With patient Time For Goal Achievement: 12/09/23 Potential to Achieve Goals: Good ADL Goals Pt Will Perform Grooming: with supervision;standing Pt Will Perform Lower Body Dressing: with supervision;sit to/from stand Pt Will Transfer to Toilet: with supervision;regular  height toilet Pt Will Perform Toileting - Clothing Manipulation and hygiene: with supervision  Plan      Co-evaluation    PT/OT/SLP Co-Evaluation/Treatment: Yes Reason for Co-Treatment: For patient/therapist safety;To address functional/ADL transfers PT goals addressed during session: Mobility/safety with mobility OT goals addressed during session: ADL's and self-care      AM-PAC OT 6 Clicks Daily Activity     Outcome Measure   Help from another person eating meals?: None Help from another person taking care of personal grooming?: A Little Help from another person toileting, which includes using toliet, bedpan, or urinal?: A Little Help from another person bathing (including washing, rinsing, drying)?: A Little Help from another person to put on and taking off regular upper body clothing?: A Little Help from another person to put on and taking off regular lower body clothing?: A Little 6 Click Score: 19    End of Session Equipment Utilized During Treatment: Rolling walker (2 wheels);Oxygen  OT Visit Diagnosis: Unsteadiness on feet (R26.81)   Activity Tolerance Patient limited by fatigue   Patient Left in chair;with call bell/phone within reach;with family/visitor present   Nurse Communication Mobility status        Time: 1105-1130 OT Time Calculation (min): 25 min  Charges: OT General Charges $OT Visit: 1 Visit OT Treatments $Self Care/Home Management : 8-22 mins  Zakery Normington OT/L Acute Rehabilitation Department  [phone]  11/27/2023, 1:32 PM

## 2023-11-27 NOTE — Progress Notes (Signed)
 NAME:  Harry Young, MRN:  [id number], DOB:  1955-05-20, LOS: 5 ADMISSION DATE:  11/22/2023, CONSULTATION DATE: 7/10 REFERRING MD: Jonell, CHIEF COMPLAINT: Respiratory failure  History of Present Illness:  68 year old male patient followed previously in our office, with known history of COPD and active tobacco abuse.  He is currently being treated by urology with plan for bladder surgery initially scheduled for 7/11 for his known bladder cancer.  Presented to presurgical testing on 7/10 with plan for TURBT to be done by Dr. Christopher on the 11th.  Noting 3 to 4 days of worsening cough with associated shortness of breath he appeared pale on presentation, tachycardic, room air saturations were 86% and he was subsequently referred to the emergency room for further evaluation.  On arrival from preanesthesia to the ER pulse oximetry is in the 70s to 80s breathing in a tripod position placed on 15 L with improvement. He was subsequently started on NIPPV.  Initial evaluation in the emergency revealed the following: Venous blood gas with pH of 7.13 pCO2 of 86 pO2 of 51 HCO3 29 serum creatinine 1.32 this is around his baseline white blood cell count 22.3.  Portable chest x-ray was clear without focal infiltrates, was hyper distended.   Critical care asked to evaluate.   Pertinent  Medical History  Tobacco abuse, emphysema/COPD, bladder cancer, left upper lobe spiculated lung mass, hearing loss, bilateral carotid artery stenosis, coronary artery disease, hypertension, hypothyroidism, mixed hyperlipidemia  Significant Hospital Events: Including procedures, antibiotic start and stop dates in addition to other pertinent events   7/10: pre-op>distress>ER, put on NIPPV. CXR no infiltrate> admit ICU for AECOPD, pneumonia; required intubation. 7/12 extubated to bipap; failed swallow eval. Cortrak placed.   Interim History / Subjective:  Cortrak removed and placed on dysphagia 3 diet Weaned to RA today Has not worn  bipap for 24 hours Congested cough  Objective    Blood pressure (!) 129/44, pulse (!) 48, temperature 98.6 F (37 C), resp. rate 16, height 5' 9 (1.753 m), weight 60 kg, SpO2 94%.        Intake/Output Summary (Last 24 hours) at 11/27/2023 0902 Last data filed at 11/27/2023 0000 Gross per 24 hour  Intake 135.42 ml  Output 950 ml  Net -814.58 ml   Filed Weights   11/25/23 0500 11/26/23 0141 11/27/23 0500  Weight: 59.9 kg 58.7 kg 60 kg    Examination: General: NAD HEENT: MM pink/moist; [place] in place Neuro: Aox3; MAE CV: s1s2, RRR, no m/r/g PULM:  dim clear BS bilaterally; [place] sats 92% GI: soft, bsx4 active  Extremities: warm/dry, no edema   Resolved problem list   Hypophosphatemia ; resolved Septic shock  Assessment and Plan   Acute hypoxic and hypercarbic respiratory failure 2/2 AECOPD COPD  Multiple solid subcentimeter peripheral pulmonary nodules; likely lung mets Possible CAP Plan: -sat goal >92%; currently on room air -pulm toiletry -pt/ot -complete course of abx on 7/14 -guaifenesin  -change triple therapy nebs to breztri  inhaler; prn duoneb -steroid taper -will need f/u w/ outpt pulmonary  Acute metabolic encephalopathy due to hypercapnia-resolvedT CKD 3a Bladder cancer, 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 Severe protein calorie malnutrition due to malignancy Hyperglycemia  Concern for dysphagia, may be due to dyspnea from COPD Plan: -per primary  Best Practice (right click and Reselect all SmartList Selections daily)   Per primary  Labs   CBC: Recent Labs  Lab 11/23/23 0451 11/24/23 0455 11/25/23 0330 11/26/23 1114 11/27/23 0308  WBC 17.9* 19.4* 18.1*  13.9* 13.9*  HGB 12.9* 11.5* 11.8* 11.5* 10.7*  HCT 38.8* 34.7* 36.2* 36.3* 33.1*  MCV 96.8 97.2 99.2 102.8* 99.7  PLT 194 150 170 148* 149*    Basic Metabolic Panel: Recent Labs  Lab 11/22/23 1148 11/22/23 1640 11/23/23 0451 11/23/23 0823 11/23/23 1640  11/24/23 0455 11/24/23 1653 11/25/23 0330 11/26/23 1114 11/27/23 0308  NA 139  --  135  --   --   --   --  140 143 140  K 4.3  --  3.4*  --   --   --   --  4.4 4.7 4.4  CL 101  --  101  --   --   --   --  104 109 104  CO2 26  --  25  --   --   --   --  23 24 29   GLUCOSE 152*  --  260*  --   --   --   --  123* 137* 99  BUN 18  --  31*  --   --   --   --  42* 45* 49*  CREATININE 1.32*  --  1.66*  --   --   --   --  1.51* 1.19 1.17  CALCIUM 9.6  --  9.1  --   --   --   --  9.0 9.0 8.6*  MG  --   --  2.4   < > 2.2 2.2 2.3 2.5* 2.6*  --   PHOS  --    < > <1.0*   < > 4.0 4.1 4.9* 4.1 2.7  --    < > = values in this interval not displayed.   GFR: Estimated Creatinine Clearance: 50.6 mL/min (by C-G formula based on SCr of 1.17 mg/dL). Recent Labs  Lab 11/22/23 1640 11/22/23 1838 11/23/23 0451 11/23/23 0823 11/24/23 0455 11/25/23 0330 11/26/23 1114 11/27/23 0308  PROCALCITON 2.63  --  6.70  --  4.87 2.30  --   --   WBC  --   --  17.9*  --  19.4* 18.1* 13.9* 13.9*  LATICACIDVEN 5.5* 4.5*  --  1.4  --   --   --   --     Critical care time: NA     JD Emilio DEVONNA Finn [facility] 11/27/2023, 9:02 AM  Please see [email or web address] for pager details.  From 7A-7P if no response, please call [phone]. After hours, please call ELink [phone].

## 2023-11-27 NOTE — Progress Notes (Signed)
 Physical Therapy Treatment Patient Details Name: Harry Young MRN: [id number] DOB: 1956-04-04 Today's Date: 11/27/2023   History of Present Illness Pt admitted from home with respiratory failure 2* COPD exacerbation - intubated 11/22/23 and extubated 11/24/23.  Pt with hx of COPD, spinal stenosis, back surgeries, bladder CA, urothelial CA s/p L nephrectomy, aortic valve insufficiency, and HOH L ear.    PT Comments  The patient ambulated x 150' using a Rw, 42' without RW, gait unsteady especially with turning and when turning head. Patient has a rollator at home and may benefit from  using it upon Dc. Recommend HHPT.  Patient  ambulated on 3 L, SPO2 100%, HR 80's.    If plan is discharge home, recommend the following: A little help with walking and/or transfers;A little help with bathing/dressing/bathroom;Assistance with cooking/housework;Assist for transportation;Help with stairs or ramp for entrance   Can travel by private vehicle        Equipment Recommendations  None recommended by PT    Recommendations for Other Services       Precautions / Restrictions Precautions Precautions: Fall Precaution/Restrictions Comments: monitor sats Restrictions Weight Bearing Restrictions Per Provider Order: No     Mobility  Bed Mobility   Bed Mobility: Supine to Sit     Supine to sit: Contact guard     General bed mobility comments: for safety    Transfers   Equipment used: Rolling walker (2 wheels) Transfers: Sit to/from Stand Sit to Stand: Contact guard assist           General transfer comment: cues  for hand placement but doid not abide    Ambulation/Gait Ambulation/Gait assistance: Contact guard assist Gait Distance (Feet): 220 Feet Assistive device: Rolling walker (2 wheels) Gait Pattern/deviations: Step-through pattern, Shuffle, Trunk flexed Gait velocity: decr     General Gait Details: cues for posture, position from RW , patient ambulated x ~ 70' without  RW   Stairs             Wheelchair Mobility     Tilt Bed    Modified Rankin (Stroke Patients Only)       Balance Overall balance assessment: Mild deficits observed, not formally tested                                          Communication Communication Communication: No apparent difficulties Factors Affecting Communication: Hearing impaired  Cognition Arousal: Alert Behavior During Therapy: WFL for tasks assessed/performed, Flat affect   PT - Cognitive impairments: No apparent impairments                         Following commands: Intact      Cueing Cueing Techniques: Verbal cues, Gestural cues  Exercises      General Comments        Pertinent Vitals/Pain Pain Assessment Pain Assessment: No/denies pain    Home Living                          Prior Function            PT Goals (current goals can now be found in the care plan section) Progress towards PT goals: Progressing toward goals    Frequency    Min 3X/week      PT Plan      Co-evaluation PT/OT/SLP  Co-Evaluation/Treatment: Yes Reason for Co-Treatment: For patient/therapist safety;To address functional/ADL transfers PT goals addressed during session: Mobility/safety with mobility OT goals addressed during session: ADL's and self-care      AM-PAC PT 6 Clicks Mobility   Outcome Measure  Help needed turning from your back to your side while in a flat bed without using bedrails?: None Help needed moving from lying on your back to sitting on the side of a flat bed without using bedrails?: None Help needed moving to and from a bed to a chair (including a wheelchair)?: None Help needed standing up from a chair using your arms (e.g., wheelchair or bedside chair)?: A Little Help needed to walk in hospital room?: A Little Help needed climbing 3-5 steps with a railing? : A Lot 6 Click Score: 20    End of Session Equipment Utilized During  Treatment: Gait belt;Oxygen Activity Tolerance: Patient tolerated treatment well Patient left: in chair;with call bell/phone within reach;with chair alarm set;with family/visitor present Nurse Communication: Mobility status PT Visit Diagnosis: Difficulty in walking, not elsewhere classified (R26.2)     Time: 8893-8867 PT Time Calculation (min) (ACUTE ONLY): 26 min  Charges:    $Gait Training: 8-22 mins PT General Charges $$ ACUTE PT VISIT: 1 Visit                     Darice Potters PT Acute Rehabilitation Services Office [phone]    Potters Darice Norris 11/27/2023, 12:48 PM

## 2023-11-27 NOTE — Progress Notes (Signed)
 [facility]                                                                               Harry Young, is a 68 y.o. male, DOB - 12/23/1955, FMW:[id number] Admit date - 11/22/2023    Outpatient Primary MD for the patient is Corlis Pagan, NP  LOS - 5  days    Brief summary    68 year old male patient followed previously in our office, with known history of COPD and active tobacco abuse.  He is currently being treated by urology with plan for bladder surgery initially scheduled for 7/11 for his known bladder cancer.   Presented to presurgical testing on 7/10 with plan for TURBT to be done by Dr. Christopher on the 11th.  Noting 3 to 4 days of worsening cough with associated shortness of breath he appeared pale on presentation, tachycardic, room air saturations were 86% and he was subsequently referred to the emergency room for further evaluation.  On arrival from preanesthesia to the ER pulse oximetry is in the 70s to 80s breathing in a tripod position placed on 15 L with improvement. He was subsequently started on NIPPV.  Initial evaluation in the emergency revealed the following: Venous blood gas with pH of 7.13 pCO2 of 86 pO2 of 51 HCO3 29 serum creatinine 1.32 this is around his baseline white blood cell count 22.3.  He was admitted by PCCM and intubated for AECOPD and pneumonia. He was subsequently extubated on 7/12 and transferred to [facility] on 7/13 Today he started having restless legs, became tachycardic and anxious. As per the wife, he takes hydrocodone  10 mg every 8 hours at home. He is requesting medications for RLS and pain.   Assessment & Plan    Assessment and Plan:   Acute hypoxic and hypercarbic respiratory failure secondary to COPD Slowly improving.  Continue with long-acting bronchodilators and BiPAP as needed.  Continue with BIPAP at night as needed.  He appears to be improving.  Continue with prednisone  taper,  Wean off oxygen as tolerated.    Opioid withdrawals  and RLS Restarted home meds  Added morphine  and robaxin .  Pain well controlled.     Septic shock sec to CAP Off pressors and d/c central line.  Complete course of antibiotics.     Acute metabolic encephalopathy due to hypercapnia Resolved, he is alert and oriented.  Therapy evaluations done.    Acute on 3A CKD Creatinine improved. Continue to monitor   Severe protein calorie malnutrition due to malignancy Nutritional consult will be consulted    Multifocal urothelial carcinoma Recommend outpatient follow-up with urology Voiding trial when hematuria resolves.    Dysphagia Patient currently on dysphagia 3 diet.    RN Pressure Injury Documentation:    Malnutrition Type:  Nutrition Problem: Severe Malnutrition Etiology: chronic illness (COPD)   Malnutrition Characteristics:  Signs/Symptoms: severe fat depletion, severe muscle depletion   Nutrition Interventions:  Interventions: Refer to RD note for recommendations, Tube feeding  Estimated body mass index is 19.53 kg/m as calculated from the following:   Height as of this encounter: 5' 9 (1.753 m).   Weight as of this encounter: 60 kg.  Code  Status: DNR  DVT Prophylaxis:  heparin  injection 5,000 Units Start: 11/23/23 0915 SCDs Start: 11/22/23 1508   Level of Care: Level of care: Progressive Family Communication: family at bedside.   Disposition Plan:     Remains inpatient appropriate:  pending clinical improvement.   Procedures:  S/p extubation on 7/12   Consultants:   PCCM.   Antimicrobials:   Anti-infectives (From admission, onward)    Start     Dose/Rate Route Frequency Ordered Stop   11/22/23 1600  azithromycin  (ZITHROMAX ) 500 mg in sodium chloride  0.9 % 250 mL IVPB  Status:  Discontinued        500 mg 250 mL/hr over 60 Minutes Intravenous Every 24 hours 11/22/23 1538 11/25/23 0848   11/22/23 1530  cefTRIAXone  (ROCEPHIN ) 2 g in sodium chloride  0.9 % 100 mL IVPB        2 g 200  mL/hr over 30 Minutes Intravenous Every 24 hours 11/22/23 1539 11/26/23 1725        Medications  Scheduled Meds:  arformoterol   15 mcg Nebulization BID   budesonide  (PULMICORT ) nebulizer solution  0.5 mg Nebulization BID   Chlorhexidine  Gluconate Cloth  6 each Topical Daily   docusate  100 mg Oral BID   feeding supplement  237 mL Oral BID BM   heparin  injection (subcutaneous)  5,000 Units Subcutaneous Q8H   insulin  aspart  0-9 Units Subcutaneous Q4H   ipratropium-albuterol   3 mL Nebulization Once   levothyroxine   75 mcg Oral QAC breakfast   nicotine   21 mg Transdermal Daily   oxidized cellulose  1 each Topical Once   predniSONE   40 mg Oral QAC breakfast   revefenacin   175 mcg Nebulization Daily   rOPINIRole   1 mg Oral TID   sodium chloride  flush  10-40 mL Intracatheter Q12H   thiamine   100 mg Oral Daily   Continuous Infusions:   PRN Meds:.acetaminophen  **OR** acetaminophen , docusate sodium , HYDROcodone -acetaminophen , ipratropium-albuterol , LORazepam , methocarbamol  (ROBAXIN ) injection, morphine  injection, ondansetron  **OR** ondansetron  (ZOFRAN ) IV, mouth rinse, polyethylene glycol, sodium chloride  flush    Subjective:   Harry Young was seen and examined today.  No chest pain or sob, no nausea or vomiting.  Objective:   Vitals:   11/27/23 0500 11/27/23 0600 11/27/23 0700 11/27/23 0820  BP: (!) 130/44 (!) 129/46 (!) 129/44   Pulse: (!) 49 (!) 55 (!) 48   Resp: 15 14 16    Temp: 98.6 F (37 C) 98.8 F (37.1 C) 98.6 F (37 C)   TempSrc:      SpO2: (!) 87% (!) 88% 90% 94%  Weight: 60 kg     Height:        Intake/Output Summary (Last 24 hours) at 11/27/2023 0902 Last data filed at 11/27/2023 0000 Gross per 24 hour  Intake 135.42 ml  Output 950 ml  Net -814.58 ml   Filed Weights   11/25/23 0500 11/26/23 0141 11/27/23 0500  Weight: 59.9 kg 58.7 kg 60 kg     Exam General exam: Appears calm and comfortable  Respiratory system: Clear to auscultation. Respiratory  effort normal. Cardiovascular system: S1 & S2 heard, RRR. No JVD,  Gastrointestinal system: Abdomen is nondistended, soft and nontender.  Central nervous system: Alert and oriented. No focal neurological deficits. Extremities: Symmetric 5 x 5 power. Skin: No rashes, Psychiatry: Mood & affect appropriate.       Data Reviewed:  I have personally reviewed following labs and imaging studies   CBC Lab Results  Component Value Date  WBC 13.9 (H) 11/27/2023   RBC 3.32 (L) 11/27/2023   HGB 10.7 (L) 11/27/2023   HCT 33.1 (L) 11/27/2023   MCV 99.7 11/27/2023   MCH 32.2 11/27/2023   PLT 149 (L) 11/27/2023   MCHC 32.3 11/27/2023   RDW 14.4 11/27/2023   LYMPHSABS 2.3 01/12/2013   MONOABS 1.1 (H) 01/12/2013   EOSABS 0.0 01/12/2013   BASOSABS 0.0 01/12/2013     Last metabolic panel Lab Results  Component Value Date   NA 140 11/27/2023   K 4.4 11/27/2023   CL 104 11/27/2023   CO2 29 11/27/2023   BUN 49 (H) 11/27/2023   CREATININE 1.17 11/27/2023   GLUCOSE 99 11/27/2023   GFRNONAA >60 11/27/2023   GFRAA 86 (L) 01/17/2013   CALCIUM 8.6 (L) 11/27/2023   PHOS 2.7 11/26/2023   PROT 6.5 11/23/2023   ALBUMIN 3.5 11/23/2023   BILITOT 0.9 11/23/2023   ALKPHOS 84 11/23/2023   AST 19 11/23/2023   ALT 15 11/23/2023   ANIONGAP 7 11/27/2023    CBG (last 3)  Recent Labs    11/26/23 2342 11/27/23 0330 11/27/23 0759  GLUCAP 140* 95 95      Coagulation Profile: No results for input(s): INR, PROTIME in the last 168 hours.   Radiology Studies: DG Swallowing Func-Speech Pathology Result Date: 11/26/2023 Table formatting from the original result was not included. Modified Barium Swallow Study Patient Details Name: Harry Young MRN: [id number] Date of Birth: Dec 26, 1955 Today's Date: 11/26/2023 HPI/PMH: HPI: Harry Young is a 68 y.o. male with medical history significant of anxiety, osteoarthritis, COPD, asthma as a child, bilateral carotid stenosis, constipation, angina,  essential hypertension, right golfers elbow, heart murmur, nephrolithiasis, transitional cell bladder cancer, urothelial carcinoma with left kidney nephrectomy, hypothyroidism, spinal stenosis of lumbar spine, lumbar radiculopathy, nonrheumatic aortic valve insufficiency, nonrheumatic aortic valve stenosis, vitamin D deficiency who presented to the emergency department with dyspnea and hypoxia of 70%.   According to his wife he has been coughing, wheezing and progressively more dyspneic in the past 3 days.  He was getting presurgical evaluation when he got severely dyspneic and had to come to the emergency department.  Most recent CTA of the chest was showing:  Fluid level layering in the right main bronchus concerning for  aspiration.  5. Diffuse bronchial thickening with segmental and scattered subsegmental bronchial impactions in the right lower lobe. No focal pneumonia is evident.  6. Multiple new bilateral pulmonary nodules highly worrisome for pulmonary metastases. 7. Emphysema. Clinical Impression: Clinical Impression: Patient presents with a primary pharyngoesophageal dyshpagia as per this modified barium swallow study. No penetration or aspiration observed with any of the liquid or solid consistencies that were tested. Anterior hyoid excursion was partial in completion but epiglottic inversion and laryngeal elevation were both complete. Patient exhibited pharyngeal residuals s/p initial swallow in pyriform sinus with liquids and solids. Subsequent dry swallows helped to clear residuals. 13mm barium tablet become lodged above PES when taken with thin liquids. Puree solids helped to transit tablet through esophagus and although transit remained slow, tablet did transit through distal esophagus. After MBS, patient did exhibit instances of hiccup which was mild in intensity and frequency. SLP recommending mechanical soft solids, thin liquids and medications be taken whole in puree/pudding. Factors that may  increase risk of adverse event in presence of aspiration Noe & Lianne 2021): Factors that may increase risk of adverse event in presence of aspiration Noe & Lianne 2021): Poor general health and/or compromised immunity; Presence  of tubes (ETT, trach, NG, etc.); Frail or deconditioned Recommendations/Plan: Swallowing Evaluation Recommendations Swallowing Evaluation Recommendations Recommendations: PO diet PO Diet Recommendation: Dysphagia 3 (Mechanical soft); Thin liquids (Level 0) Liquid Administration via: Cup; Straw Medication Administration: Whole meds with puree Supervision: Patient able to self-feed Swallowing strategies  : Slow rate; Small bites/sips; Multiple dry swallows after each bite/sip; Follow solids with liquids Postural changes: Position pt fully upright for meals; Stay upright 30-60 min after meals Oral care recommendations: Oral care BID (2x/day) Treatment Plan Treatment Plan Treatment recommendations: Therapy as outlined in treatment plan below Follow-up recommendations: No SLP follow up Functional status assessment: Patient has had a recent decline in their functional status and demonstrates the ability to make significant improvements in function in a reasonable and predictable amount of time. Treatment frequency: Min 1x/week Treatment duration: 1 week Interventions: Diet toleration management by SLP; Patient/family education; Trials of upgraded texture/liquids Recommendations Recommendations for follow up therapy are one component of a multi-disciplinary discharge planning process, led by the attending physician.  Recommendations may be updated based on patient status, additional functional criteria and insurance authorization. Assessment: Orofacial Exam: Orofacial Exam Oral Cavity: Oral Hygiene: WFL Oral Cavity - Dentition: Dentures, top; Dentures, bottom Orofacial Anatomy: WFL Oral Motor/Sensory Function: WFL Anatomy: Anatomy: Suspected cervical osteophytes Boluses Administered:  Boluses Administered Boluses Administered: Thin liquids (Level 0); Mildly thick liquids (Level 2, nectar thick); Moderately thick liquids (Level 3, honey thick); Puree; Solid  Oral Impairment Domain: Oral Impairment Domain Lip Closure: No labial escape Tongue control during bolus hold: Cohesive bolus between tongue to palatal seal Bolus preparation/mastication: Slow prolonged chewing/mashing with complete recollection Bolus transport/lingual motion: Brisk tongue motion Oral residue: Complete oral clearance Location of oral residue : N/A Initiation of pharyngeal swallow : Valleculae  Pharyngeal Impairment Domain: Pharyngeal Impairment Domain Soft palate elevation: No bolus between soft palate (SP)/pharyngeal wall (PW) Laryngeal elevation: Complete superior movement of thyroid  cartilage with complete approximation of arytenoids to epiglottic petiole Anterior hyoid excursion: Partial anterior movement Epiglottic movement: Complete inversion Laryngeal vestibule closure: Complete, no air/contrast in laryngeal vestibule Pharyngeal stripping wave : Present - complete Pharyngeal contraction (A/P view only): N/A Pharyngoesophageal segment opening: Partial distention/partial duration, partial obstruction of flow Tongue base retraction: No contrast between tongue base and posterior pharyngeal wall (PPW) Pharyngeal residue: Collection of residue within or on pharyngeal structures Location of pharyngeal residue: Pyriform sinuses  Esophageal Impairment Domain: Esophageal Impairment Domain Esophageal clearance upright position: Esophageal retention Pill: Pill Consistency administered: Thin liquids (Level 0) Thin liquids (Level 0): Impaired (see clinical impressions) Penetration/Aspiration Scale Score: Penetration/Aspiration Scale Score 1.  Material does not enter airway: Thin liquids (Level 0); Mildly thick liquids (Level 2, nectar thick); Moderately thick liquids (Level 3, honey thick); Puree; Solid; Pill Compensatory Strategies:  Compensatory Strategies Compensatory strategies: Yes Multiple swallows: Effective Effective Multiple Swallows: Mildly thick liquid (Level 2, nectar thick); Moderately thick liquid (Level 3, honey thick); Puree; Thin liquid (Level 0); Solid   General Information: No data recorded Diet Prior to this Study: Cortrak/Small bore NG tube   Temperature : Normal   Respiratory Status: WFL   Supplemental O2: Nasal cannula   History of Recent Intubation: Yes  Behavior/Cognition: Alert; Cooperative; Pleasant mood Self-Feeding Abilities: Able to self-feed Baseline vocal quality/speech: Normal Volitional Cough: Able to elicit Volitional Swallow: Able to elicit Exam Limitations: No limitations Goal Planning: Prognosis for improved oropharyngeal function: Good No data recorded No data recorded Patient/Family Stated Goal: None stated Consulted and agree with results and recommendations: Patient Pain:  Pain Assessment Pain Assessment: No/denies pain Pain Score: 0 Facial Expression: 0 Body Movements: 0 Muscle Tension: 0 Compliance with ventilator (intubated pts.): N/A Vocalization (extubated pts.): 0 CPOT Total: 0 End of Session: Start Time:SLP Start Time (ACUTE ONLY): 9147 Stop Time: SLP Stop Time (ACUTE ONLY): 0915 Time Calculation:SLP Time Calculation (min) (ACUTE ONLY): 23 min Charges: SLP Evaluations $ SLP Speech Visit: 1 Visit SLP Evaluations $BSS Swallow: 1 Procedure $MBS Swallow: 1 Procedure SLP visit diagnosis: SLP Visit Diagnosis: Dysphagia, pharyngoesophageal phase (R13.14) Past Medical History: Past Medical History: Diagnosis Date  Anxiety disorder, unspecified   Arthritis   Asthma   as a child  Asymmetrical hearing loss of left ear 01/03/2018  Bilateral carotid artery stenosis 08/07/2015  Dr. Dorn Lesches- 04/01/15- Heterogenous plaque- 1-39% RICA stenosis.  Homogenous plaque- 40-59% LICA stenosis.  >50% LECA stenosis.  Normal subclavian arteries bilaterally.  Patent vertebral arteries with antegrade flow.  Complication  of anesthesia   slow to wake up  Constipation 08/07/2015  COPD (chronic obstructive pulmonary disease) (HCC)   Coronary artery disease involving native coronary artery of native heart without angina pectoris 04/03/2019  Displacement of lumbar intervertebral disc without myelopathy 04/14/2013  Dyspnea   Essential (primary) hypertension 04/03/2019  Golfer's elbow, right 03/30/2016  Heart murmur   History of kidney removal   left kidney . due to cancer  History of kidney stones   Hyperlipidemia 08/07/2015  Hypothyroidism due to Hashimoto's thyroiditis 07/15/2015  Leg weakness 08/07/2015  Lumbar radiculopathy 10/21/2013  Nocturia   Nonrheumatic aortic valve insufficiency 02/22/2019  Nonrheumatic aortic valve stenosis 02/22/2019  Spinal stenosis of lumbar region 05/19/2015  Transitional cell carcinoma of bladder (HCC) UROLOGIST--  DR CEIL  S/P TURBT X3--  HX BCG TX'S  Vitamin D deficiency  Past Surgical History: Past Surgical History: Procedure Laterality Date  BRONCHIAL BIOPSY  01/09/2022  Procedure: BRONCHIAL BIOPSIES;  Surgeon: Gladis Leonor HERO, MD;  Location: [facility] ENDOSCOPY;  Service: Pulmonary;;  BRONCHIAL BRUSHINGS  01/09/2022  Procedure: BRONCHIAL BRUSHINGS;  Surgeon: Gladis Leonor HERO, MD;  Location: [facility] ENDOSCOPY;  Service: Pulmonary;;  BRONCHIAL NEEDLE ASPIRATION BIOPSY  01/09/2022  Procedure: BRONCHIAL NEEDLE ASPIRATION BIOPSIES;  Surgeon: Gladis Leonor HERO, MD;  Location: [facility] ENDOSCOPY;  Service: Pulmonary;;  CYSTOSCOPY  05/10/2022  Procedure: CYSTOSCOPY WITH STENT REMOVAL;  Surgeon: Alvaro Hummer, MD;  Location: [facility];  Service: Urology;;  PHYLLIS W/ RETROGRADES Bilateral 12/13/2012  Procedure: CYSTOSCOPY WITH RETROGRADE PYELOGRAM;  Surgeon: Mark C Ottelin, MD;  Location: [facility];  Service: Urology;  Laterality: Bilateral;  CYSTOSCOPY W/ RETROGRADES Right 04/27/2023  Procedure: CYSTOSCOPY WITH RIGHT RETROGRADE PYELOGRAM;  Surgeon: Alvaro Hummer KATHEE Mickey., MD;  Location: [facility];   Service: Urology;  Laterality: Right;  60 MINUTES NEEDED FOR CASE  CYSTOSCOPY WITH LITHOLAPAXY N/A 04/04/2013  Procedure: CYSTOSCOPY ;  Surgeon: Oneil JAYSON CEIL, MD;  Location: [facility];  Service: Urology;  Laterality: N/A;  CYSTOSCOPY WITH RETROGRADE PYELOGRAM, URETEROSCOPY AND STENT PLACEMENT Bilateral 02/28/2022  Procedure: CYSTOSCOPY WITH RETROGRADE PYELOGRAM, URETEROSCOPY, LEFT STENT PLACEMENT;  Surgeon: Rosalind Zachary KATHEE, MD;  Location: [facility];  Service: Urology;  Laterality: Bilateral;  1 HR  LUMBAR DISC SURGERY  09/20/2005  L2 -- L5  LUMBAR LAMINECTOMY/DECOMPRESSION MICRODISCECTOMY Left 01/09/2013  Procedure: Left  lumbar four-five microdiskectomy;  Surgeon: Rockey LITTIE Peru, MD;  Location: [facility];  Service: Neurosurgery;  Laterality: Left;  LUMBAR WOUND DEBRIDEMENT N/A 01/13/2013  Procedure: LUMBAR WOUND DEBRIDEMENT;  Surgeon: Victory DELENA Gunnels, MD;  Location: [facility];  Service: Neurosurgery;  Laterality: N/A;  Lumbar Wound Exploration,  Attempted Placement of Cerebrospinal Drain, and Repair of CSF Leak  ROBOT ASSITED LAPAROSCOPIC NEPHROURETERECTOMY Left 05/10/2022  Procedure: XI ROBOT ASSITED LAPAROSCOPIC NEPHROURETERECTOMY;  Surgeon: Alvaro Hummer, MD;  Location: [facility];  Service: Urology;  Laterality: Left;  TOOTH EXTRACTION    TRANSURETHRAL RESECTION OF BLADDER TUMOR  07-05-2009;   11-22-2009  bladder cancer  TRANSURETHRAL RESECTION OF BLADDER TUMOR N/A 12/13/2012  Procedure: TRANSURETHRAL RESECTION OF BLADDER TUMOR (TURBT) AND INSTILLATION OF MYTOMYCIN;  Surgeon: Mark C Ottelin, MD;  Location[facility];  Service: Urology;  Laterality: N/A;  TRANSURETHRAL RESECTION OF BLADDER TUMOR N/A 04/27/2023  Procedure: TRANSURETHRAL RESECTION OF BLADDER TUMOR (TURBT);  Surgeon: Alvaro Hummer KATHEE Mickey., MD;  Location: [facility];  Service: Urology;  Laterality: N/A;  TRANSURETHRAL RESECTION OF BLADDER TUMOR WITH GYRUS (TURBT-GYRUS) N/A 04/04/2013  Procedure: TRANSURETHRAL  RESECTION OF BLADDER TUMOR WITH GYRUS (TURBT-GYRUS);  Surgeon: Mark C Ottelin, MD;  Location: [facility];  Service: Urology;  Laterality: N/A; Norleen IVAR Blase, MA, CCC-SLP Speech Therapy       Elgie Butter M.D. [facility] 11/27/2023, 9:02 AM  Available via Epic secure chat 7am-7pm After 7 pm, please refer to night coverage provider listed on amion.

## 2023-11-27 NOTE — TOC Progression Note (Signed)
 Transition of Care [facility]) - Progression Note    Patient Details  Name: Harry Young MRN: [id number] Date of Birth: 1955/11/15  Transition of Care [facility]) CM/SW Contact  Jon ONEIDA Anon, RN Phone Number: 11/27/2023, 1:47 PM  Clinical Narrative:    NCM spoke with pt with pt spouse at bedside about PT recommendation for HHPT. Pt and spouse in agreement with recommendation for HHPT. Asked pt if he had any preference and he states no. NCM spoke with Cindie with [facility] and she agrees to accept pt and set up HHPT services. Pt will need HH orders at discharge. TOC continuing to follow.     Expected Discharge Plan:  (TBD) Barriers to Discharge: Continued Medical Work up  Expected Discharge Plan and Services     Post Acute Care Choice: Home Health                             HH Arranged: PT [facility] Agency: [facility] Date [facility] Agency Contacted: 11/27/23 Time HH Agency Contacted: 1339 Representative spoke with at [facility] Agency: Cindie Sillmon   Social Determinants of Health (SDOH) Interventions SDOH Screenings   Food Insecurity: Patient Unable To Answer (11/23/2023)  Housing: Patient Unable To Answer (11/23/2023)  Transportation Needs: Patient Unable To Answer (11/23/2023)  Utilities: Patient Unable To Answer (11/23/2023)  Social Connections: Patient Unable To Answer (11/23/2023)  Tobacco Use: High Risk (11/22/2023)    Readmission Risk Interventions     No data to display

## 2023-11-27 NOTE — Telephone Encounter (Signed)
 Patient is now scheduled 7/30 w/ Dewald!

## 2023-11-27 NOTE — Plan of Care (Signed)
  Problem: Activity: Goal: Ability to tolerate increased activity will improve Outcome: Progressing   Problem: Respiratory: Goal: Ability to maintain a clear airway will improve Outcome: Progressing Goal: Levels of oxygenation will improve Outcome: Progressing Goal: Ability to maintain adequate ventilation will improve Outcome: Progressing   Problem: Clinical Measurements: Goal: Will remain free from infection Outcome: Progressing Goal: Diagnostic test results will improve Outcome: Progressing Goal: Respiratory complications will improve Outcome: Progressing Goal: Cardiovascular complication will be avoided Outcome: Progressing

## 2023-11-28 DIAGNOSIS — E785 Hyperlipidemia, unspecified: Secondary | ICD-10-CM

## 2023-11-28 DIAGNOSIS — I10 Essential (primary) hypertension: Secondary | ICD-10-CM

## 2023-11-28 LAB — GLUCOSE, CAPILLARY
Glucose-Capillary: 106 mg/dL — ABNORMAL HIGH (ref 70–99)
Glucose-Capillary: 124 mg/dL — ABNORMAL HIGH (ref 70–99)
Glucose-Capillary: 158 mg/dL — ABNORMAL HIGH (ref 70–99)
Glucose-Capillary: 162 mg/dL — ABNORMAL HIGH (ref 70–99)
Glucose-Capillary: 85 mg/dL (ref 70–99)

## 2023-11-28 LAB — CBC
HCT: 34.8 % — ABNORMAL LOW (ref 39.0–52.0)
Hemoglobin: 11.3 g/dL — ABNORMAL LOW (ref 13.0–17.0)
MCH: 31.8 pg (ref 26.0–34.0)
MCHC: 32.5 g/dL (ref 30.0–36.0)
MCV: 98 fL (ref 80.0–100.0)
Platelets: 153 K/uL (ref 150–400)
RBC: 3.55 MIL/uL — ABNORMAL LOW (ref 4.22–5.81)
RDW: 13.9 % (ref 11.5–15.5)
WBC: 12 K/uL — ABNORMAL HIGH (ref 4.0–10.5)
nRBC: 0 % (ref 0.0–0.2)

## 2023-11-28 LAB — CULTURE, BLOOD (ROUTINE X 2)
Culture: NO GROWTH
Culture: NO GROWTH
Special Requests: ADEQUATE
Special Requests: ADEQUATE

## 2023-11-28 LAB — BASIC METABOLIC PANEL WITH GFR
Anion gap: 8 (ref 5–15)
BUN: 38 mg/dL — ABNORMAL HIGH (ref 8–23)
CO2: 29 mmol/L (ref 22–32)
Calcium: 7.8 mg/dL — ABNORMAL LOW (ref 8.9–10.3)
Chloride: 102 mmol/L (ref 98–111)
Creatinine, Ser: 1.08 mg/dL (ref 0.61–1.24)
GFR, Estimated: >60 mL/min (ref >60)
Glucose, Bld: 124 mg/dL — ABNORMAL HIGH (ref 70–99)
Potassium: 4 mmol/L (ref 3.5–5.1)
Sodium: 139 mmol/L (ref 135–145)

## 2023-11-28 MED ORDER — GUAIFENESIN 100 MG/5ML PO LIQD: 10.0000 mL | Freq: Three times a day (TID) | ORAL | 0 refills | Status: DC

## 2023-11-28 MED ORDER — IPRATROPIUM-ALBUTEROL 0.5-2.5 (3) MG/3ML IN SOLN: 3.0000 mL | RESPIRATORY_TRACT | 2 refills | Status: DC | PRN

## 2023-11-28 MED ORDER — ENSURE PLUS HIGH PROTEIN PO LIQD: 237.0000 mL | Freq: Two times a day (BID) | ORAL | 2 refills | Status: AC

## 2023-11-28 MED ORDER — ROPINIROLE HCL 1 MG PO TABS
1.0000 mg | ORAL_TABLET | Freq: Three times a day (TID) | ORAL | 1 refills | Status: AC
Start: 2023-11-28 — End: ?

## 2023-11-28 MED ORDER — PREDNISONE 10 MG PO TABS: ORAL_TABLET | ORAL | 0 refills | Status: AC

## 2023-11-28 MED ORDER — NICOTINE 21 MG/24HR TD PT24: 21.0000 mg | MEDICATED_PATCH | Freq: Every day | TRANSDERMAL | 0 refills | Status: DC

## 2023-11-28 NOTE — Progress Notes (Signed)
   11/28/23 0731  Oxygen Therapy/Pulse Ox  SpO2 (!) (S)  87 % (Placed pt on 2 L [place] post neb given with oxygen.)

## 2023-11-28 NOTE — Progress Notes (Addendum)
 Mobility Specialist - Progress Note   11/28/23 1300  Mobility  Activity Ambulated with assistance in hallway  Level of Assistance Standby assist, set-up cues, supervision of patient - no hands on  Assistive Device None  Distance Ambulated (ft) 140 ft  Range of Motion/Exercises Active  Activity Response Tolerated well  Mobility Referral Yes  Mobility visit 1 Mobility  Mobility Specialist Start Time (ACUTE ONLY) 1315  Mobility Specialist Stop Time (ACUTE ONLY) 1344  Mobility Specialist Time Calculation (min) (ACUTE ONLY) 29 min   Received in bed and agreed to mobility.   Per nurse request, did walking O2 Saturation test.   On Room air resting, SpO2% - 93% On Room air ambulating, SpO2% - 87%  On 1L ambulating, SpO2% - 91%  Returned to bed where pt was left on supplemental O2. With all needs met, family in room.  Cyndee Ada Mobility Specialist

## 2023-11-28 NOTE — TOC Transition Note (Signed)
 Transition of Care [facility]) - Discharge Note  Patient Details  Name: DEMARQUEZ CIOLEK MRN: [id number] Date of Birth: 14-Jul-1955  Transition of Care [facility]) CM/SW Contact:  Duwaine GORMAN Aran, LCSW Phone Number: 11/28/2023, 2:51 PM  Clinical Narrative: HHPT/OT orders placed by hospitalist. CSW updated Cindie with [employer]. Patient will need home oxygen. CSW met with patient and spouse, Iktan Aikman, to discuss home oxygen needs. Patient and spouse do not have a DME company preference and are agreeable to [employer] delivering home oxygen. Patient requested to be evaluated for a POC. CSW made oxygen referral to [facility] with [employer], which was accepted. [employer] to deliver oxygen to room.  Final next level of care: Home w Home Health Services Barriers to Discharge: Barriers Resolved  Patient Goals and CMS Choice Patient states their goals for this hospitalization and ongoing recovery are:: Return home CMS Medicare.gov Compare Post Acute Care list provided to:: Patient Choice offered to / list presented to : Patient, Spouse [place] ownership interest in [facility] [place].provided to:: Spouse   Discharge Plan and Services Additional resources added to the After Visit Summary for   Post Acute Care Choice: Home Health          DME Arranged: Oxygen DME Agency: [employer] Date DME Agency Contacted: 11/28/23 Time DME Agency Contacted: 1423 Representative spoke with at DME Agency: Mitch HH Arranged: OT, PT HH Agency: [facility] Date [facility] Agency Contacted: 11/27/23 Time HH Agency Contacted: 1339 Representative spoke with at [facility] Agency: Cindie Sillmon  Social Drivers of Health (SDOH) Interventions SDOH Screenings   Food Insecurity: Patient Unable To Answer (11/23/2023)  Housing: Patient Unable To Answer (11/23/2023)  Transportation Needs: Patient Unable To Answer (11/23/2023)  Utilities: Patient Unable To Answer (11/23/2023)  Social Connections: Patient Unable To Answer (11/23/2023)  Tobacco Use: High  Risk (11/22/2023)   Readmission Risk Interventions     No data to display

## 2023-11-28 NOTE — Progress Notes (Signed)
 Pt refused morning labs.

## 2023-11-28 NOTE — Progress Notes (Signed)
 Speech Language Pathology Treatment: Dysphagia  Patient Details Name: Harry Young MRN: [id number] DOB: [birth year] Today's Date: 11/28/2023 Time: 8979-8952 SLP Time Calculation (min) (ACUTE ONLY): 27 min  Assessment / Plan / Recommendation Clinical Impression  Patient seen to assess po tolerance, review findings of MBS and compensation strategies.  Pt was being fed coffee, eggs and other breakfast items by wife.  He demonstrates overt cough x1 during intake but cough was not productive. He does endorse h/o feeling like he never had a big opening in his throat for swallowing.  Reports occasional issues with sensing pills and food sticking - pointing to pharynx.  Denies having to cough up food nor requiring heimlich maneuver.  Voice is mildly dysphonic - and pt reports it is not at baseline level at this time - only intubated approximately 2 to 2.5 days.    SLP reviewed MBS with pt and his wife - showing that his barium tablet lodged at [facility] and required puree to transit it into esophagus after it did not clear with extra liquid boluses. He admits to sensation to the tablet on MBS - and says its similar to sensation occurring when pills are problematic. Recommended he continue to take his pills with icecream or pudding to prevent this occurrence.    Advised he always start his intake with liquids - follow solids with liquids and chew very thoroughly. He has ill fitting dentures - and thus recommend he continue dys3 diet while in hospital.    Further advised they consider buying an anti=choking apparatus such as life vac due to his h/o dysphagia.  All education completed for dysphagia mitigation - no SLP follow up needed. Thanks for this consult. SABRA   HPI HPI: Harry Young is a 68 y.o. male with medical history significant of anxiety, osteoarthritis, COPD, asthma as a child, bilateral carotid stenosis, constipation, angina, essential hypertension, right golfers elbow, heart murmur, nephrolithiasis,  transitional cell bladder cancer, urothelial carcinoma with left kidney nephrectomy, hypothyroidism, spinal stenosis of lumbar spine, lumbar radiculopathy, nonrheumatic aortic valve insufficiency, nonrheumatic aortic valve stenosis, vitamin D deficiency who presented to the emergency department with dyspnea and hypoxia of 70%.   According to his wife he has been coughing, wheezing and progressively more dyspneic in the past 3 days.  He was getting presurgical evaluation when he got severely dyspneic and had to come to the emergency department.  Most recent CTA of the chest was showing:  Fluid level layering in the right main bronchus concerning for  aspiration.  5. Diffuse bronchial thickening with segmental and scattered subsegmental bronchial impactions in the right lower lobe. No focal pneumonia is evident.  6. Multiple new bilateral pulmonary nodules highly worrisome for pulmonary metastases. 7. Emphysema.      SLP Plan  All goals met          Recommendations  Diet recommendations: Dysphagia 3 (mechanical soft);Thin liquid Liquids provided via: Cup;Straw Medication Administration: Whole meds with puree (start and follow intake with liquids) Supervision: Patient able to self feed Compensations: Slow rate;Small sips/bites Postural Changes and/or Swallow Maneuvers: Seated upright 90 degrees;Upright 30-60 min after meal                      None Dysphagia, pharyngoesophageal phase (R13.14)     All goals met   Madelin POUR, MS [facility] SLP Acute Rehab Services Office [phone]   Nicolas Emmie Caldron  11/28/2023, 11:53 AM

## 2023-11-29 ENCOUNTER — Other Ambulatory Visit: Payer: Self-pay | Admitting: Urology

## 2023-11-29 ENCOUNTER — Telehealth: Payer: Self-pay

## 2023-11-29 NOTE — Transitions of Care (Post Inpatient/ED Visit) (Signed)
   11/29/2023  Name: Harry Young MRN: [id number] DOB: 02-May-1956  Today's TOC FU Call Status: Today's TOC FU Call Status:: Unsuccessful Call (1st Attempt) Unsuccessful Call (1st Attempt) Date: 11/29/23  Attempted to reach the patient regarding the most recent Inpatient/ED visit.  Follow Up Plan: Additional outreach attempts will be made to reach the patient to complete the Transitions of Care (Post Inpatient/ED visit) call.   Lawyer Washabaugh J. Sarah-Jane Nazario RN, MSN [facility], [facility] RN Care Manager Direct Dial: [phone]  Fax: [phone] Website: [email or web address]

## 2023-11-30 ENCOUNTER — Telehealth: Payer: Self-pay

## 2023-11-30 NOTE — Patient Instructions (Signed)
 Visit Information  Thank you for taking time to visit with me today. Please don't hesitate to contact me if I can be of assistance to you.   Reviewed:  Oxygen use- keeping oxygen levels above 80. Encouraged patient to pace self with activity. Reviewed importance of medication compliance. Reviewed when to call for physician for increased shortness of breath, increased sputum, fatigue that is not normal, cough, fever or sick contacts.      The patient verbalized understanding of instructions, educational materials, and care plan provided today and DECLINED offer to receive copy of patient instructions, educational materials, and care plan.   The patient has been provided with contact information for the care management team and has been advised to call with any health related questions or concerns.    Please call the Suicide and Crisis Lifeline: 988 if you are experiencing a Mental Health or Behavioral Health Crisis or need someone to talk to.  Yechezkel Fertig J. Brenton Joines RN, MSN [facility], [facility] RN Care Manager Direct Dial: [phone]  Fax: [phone] Website: [email or web address]

## 2023-12-01 NOTE — Discharge Summary (Signed)
 Physician Discharge Summary   Patient: Harry Young MRN: [id number] DOB: [birth year]  Admit date:     11/22/2023  Discharge date: 11/28/2023  Discharge Physician: Elgie Butter   PCP: Corlis Pagan, NP   Recommendations at discharge:  Please follow up with PCP in one week.  Please follow up with Urology in one week for voiding trial.  Please follow up with pulmonology for pulmonary function tests Please follow up with Dr Alvaro for cystectomy for urinary bladder malignancy.   Discharge Diagnoses: Principal Problem:   COPD with acute exacerbation (HCC) Active Problems:   Essential (primary) hypertension   Hypothyroidism, unspecified   Hyperlipidemia   Acute respiratory failure with hypoxia and hypercapnia (HCC)   Protein-calorie malnutrition, severe    Hospital Course: 68 year old male patient followed previously in our office, with known history of COPD and active tobacco abuse.  He is currently being treated by urology with plan for bladder surgery initially scheduled for 7/11 for his known bladder cancer.   Presented to presurgical testing on 7/10 with plan for TURBT to be done by Dr. Christopher on the 11th.  Noting 3 to 4 days of worsening cough with associated shortness of breath he appeared pale on presentation, tachycardic, room air saturations were 86% and he was subsequently referred to the emergency room for further evaluation.  On arrival from preanesthesia to the ER pulse oximetry is in the 70s to 80s breathing in a tripod position placed on 15 L with improvement. He was subsequently started on NIPPV.  Initial evaluation in the emergency revealed the following: Venous blood gas with pH of 7.13 pCO2 of 86 pO2 of 51 HCO3 29 serum creatinine 1.32 this is around his baseline white blood cell count 22.3.  He was admitted by PCCM and intubated for AECOPD and pneumonia. He was subsequently extubated on 7/12 and transferred to [facility] on 7/13 Today he started having restless legs, became  tachycardic and anxious. As per the wife, he takes hydrocodone  10 mg every 8 hours at home. He is requesting medications for RLS and pain.   Assessment and Plan:  Acute hypoxic and hypercarbic respiratory failure secondary to COPD Slowly improving.  Continue with long-acting bronchodilators and BiPAP as needed.  Continue with BIPAP at night as needed.  He appears to be improving.  Continue with prednisone  taper on discharge.  Wean off oxygen as tolerated.      Opioid withdrawals and RLS Restarted home meds  Added morphine  and robaxin .  Pain well controlled.        Septic shock sec to CAP Off pressors and d/c central line.  Completed course of antibiotics.        Acute metabolic encephalopathy due to hypercapnia Resolved, he is alert and oriented.  Therapy evaluations done.      Acute on 3A CKD Creatinine improved. Continue to monitor     Severe protein calorie malnutrition due to malignancy Nutritional consult will be consulted       Multifocal urothelial carcinoma Recommend outpatient follow-up with urology Plan was to do voiding trial when hematuria resolved.  Urine is clearing up, but patient wanted to go home and follow up with urology at the office for voiding trial.  Discussed with Urology, and updated .     Dysphagia Patient currently on dysphagia 3 diet.       RN Pressure Injury Documentation:   Malnutrition Type:   Nutrition Problem: Severe Malnutrition Etiology: chronic illness (COPD)     Malnutrition Characteristics:  Signs/Symptoms: severe fat depletion, severe muscle depletion     Nutrition Interventions:   Interventions: Refer to RD note for recommendations, Tube feeding   Estimated body mass index is 19.53 kg/m as calculated from the following:   Height as of this encounter: 5' 9 (1.753 m).   Weight as of this encounter: 60 kg.    Consultants: pccm, curbside with urology.  Procedures performed: none.   Disposition:  Home Diet recommendation:  Dysphagia type 3 thin  Liquid DISCHARGE MEDICATION: Allergies as of 11/28/2023       Reactions   Crestor [rosuvastatin] Other (See Comments)   Urinary retention.   Sulfa Antibiotics Rash        Medication List     STOP taking these medications    aspirin EC 81 MG tablet       TAKE these medications    albuterol  108 (90 Base) MCG/ACT inhaler Commonly known as: VENTOLIN  HFA Inhale 1-2 puffs into the lungs every 6 (six) hours as needed for shortness of breath or wheezing.   amLODipine  2.5 MG tablet Commonly known as: NORVASC  Take 2.5 mg by mouth daily.   Breo Ellipta  200-25 MCG/ACT Aepb Generic drug: fluticasone  furoate-vilanterol Inhale 1 puff into the lungs daily.   CAL-MAG-ZINC PO Take 1 tablet by mouth daily.   CO Q 10 PO Take 1 capsule by mouth daily.   ezetimibe 10 MG tablet Commonly known as: ZETIA Take 10 mg by mouth at bedtime.   feeding supplement Liqd Take 237 mLs by mouth 2 (two) times daily between meals.   Fish Oil 500 MG Caps Take 500 mg by mouth daily.   guaiFENesin  100 MG/5ML liquid Commonly known as: ROBITUSSIN Take 10 mLs by mouth 3 (three) times daily.   HYDROcodone -acetaminophen  10-325 MG tablet Commonly known as: NORCO Take 1 tablet by mouth 3 (three) times daily as needed for moderate pain (pain score 4-6).   ipratropium-albuterol  0.5-2.5 (3) MG/3ML Soln Commonly known as: DUONEB Take 3 mLs by nebulization every 4 (four) hours as needed.   levothyroxine  75 MCG tablet Commonly known as: SYNTHROID  Take 75 mcg by mouth daily before breakfast.   nicotine  21 mg/24hr patch Commonly known as: NICODERM CQ  - dosed in mg/24 hours Place 1 patch (21 mg total) onto the skin daily.   predniSONE  10 MG tablet Commonly known as: DELTASONE  Take 4 tablets (40 mg total) by mouth daily with breakfast for 2 days, THEN 3 tablets (30 mg total) daily with breakfast for 3 days, THEN 2 tablets (20 mg total) daily with  breakfast for 3 days, THEN 1 tablet (10 mg total) daily with breakfast for 3 days. Start taking on: November 29, 2023   RESTFUL LEGS SL Place 2 tablets under the tongue at bedtime.   rOPINIRole  1 MG tablet Commonly known as: REQUIP  Take 1 tablet (1 mg total) by mouth 3 (three) times daily.   simvastatin 5 MG tablet Commonly known as: ZOCOR Take 5 mg by mouth at bedtime.   Vitamin D3 25 MCG (1000 UT) Caps Take 1,000 Units by mouth daily.        Follow-up Information     [facility] Follow up.   Specialty: Home Health Services Why: Once you are discharged from the hospital, someone from [facility] will contact you to start your physical and occupational therapies. Contact information: [address] [place] [address] [phone]         [facility] Follow up.  Why: [facility] will provide home oxygen.               Discharge Exam: Filed Weights   11/26/23 0141 11/27/23 0500 11/28/23 0444  Weight: 58.7 kg 60 kg 57.6 kg   General exam: Appears calm and comfortable  Respiratory system: Clear to auscultation. Respiratory effort normal. Cardiovascular system: S1 & S2 heard, RRR. No JVD,  Gastrointestinal system: Abdomen is nondistended, soft and nontender.  Central nervous system: Alert and oriented. No focal neurological deficits. Extremities: Symmetric 5 x 5 power. Skin: No rashes,  Psychiatry: Mood & affect appropriate.    Condition at discharge: fair  The results of significant diagnostics from this hospitalization (including imaging, microbiology, ancillary and laboratory) are listed below for reference.   Imaging Studies: DG Swallowing Func-Speech Pathology Result Date: 11/26/2023 Table formatting from the original result was not included. Modified Barium Swallow Study Patient Details Name: MERION GRIMALDO MRN: [id number] Date of Birth: 1956/02/12 Today's Date: 11/26/2023 HPI/PMH: HPI: RENDELL THIVIERGE is a 68 y.o. male with  medical history significant of anxiety, osteoarthritis, COPD, asthma as a child, bilateral carotid stenosis, constipation, angina, essential hypertension, right golfers elbow, heart murmur, nephrolithiasis, transitional cell bladder cancer, urothelial carcinoma with left kidney nephrectomy, hypothyroidism, spinal stenosis of lumbar spine, lumbar radiculopathy, nonrheumatic aortic valve insufficiency, nonrheumatic aortic valve stenosis, vitamin D deficiency who presented to the emergency department with dyspnea and hypoxia of 70%.   According to his wife he has been coughing, wheezing and progressively more dyspneic in the past 3 days.  He was getting presurgical evaluation when he got severely dyspneic and had to come to the emergency department.  Most recent CTA of the chest was showing:  Fluid level layering in the right main bronchus concerning for  aspiration.  5. Diffuse bronchial thickening with segmental and scattered subsegmental bronchial impactions in the right lower lobe. No focal pneumonia is evident.  6. Multiple new bilateral pulmonary nodules highly worrisome for pulmonary metastases. 7. Emphysema. Clinical Impression: Clinical Impression: Patient presents with a primary pharyngoesophageal dyshpagia as per this modified barium swallow study. No penetration or aspiration observed with any of the liquid or solid consistencies that were tested. Anterior hyoid excursion was partial in completion but epiglottic inversion and laryngeal elevation were both complete. Patient exhibited pharyngeal residuals s/p initial swallow in pyriform sinus with liquids and solids. Subsequent dry swallows helped to clear residuals. 13mm barium tablet become lodged above PES when taken with thin liquids. Puree solids helped to transit tablet through esophagus and although transit remained slow, tablet did transit through distal esophagus. After MBS, patient did exhibit instances of hiccup which was mild in intensity and  frequency. SLP recommending mechanical soft solids, thin liquids and medications be taken whole in puree/pudding. Factors that may increase risk of adverse event in presence of aspiration Noe & Lianne 2021): Factors that may increase risk of adverse event in presence of aspiration Noe & Lianne 2021): Poor general health and/or compromised immunity; Presence of tubes (ETT, trach, NG, etc.); Frail or deconditioned Recommendations/Plan: Swallowing Evaluation Recommendations Swallowing Evaluation Recommendations Recommendations: PO diet PO Diet Recommendation: Dysphagia 3 (Mechanical soft); Thin liquids (Level 0) Liquid Administration via: Cup; Straw Medication Administration: Whole meds with puree Supervision: Patient able to self-feed Swallowing strategies  : Slow rate; Small bites/sips; Multiple dry swallows after each bite/sip; Follow solids with liquids Postural changes: Position pt fully upright for meals; Stay upright 30-60 min after meals Oral care recommendations: Oral care BID (2x/day) Treatment Plan Treatment  Plan Treatment recommendations: Therapy as outlined in treatment plan below Follow-up recommendations: No SLP follow up Functional status assessment: Patient has had a recent decline in their functional status and demonstrates the ability to make significant improvements in function in a reasonable and predictable amount of time. Treatment frequency: Min 1x/week Treatment duration: 1 week Interventions: Diet toleration management by SLP; Patient/family education; Trials of upgraded texture/liquids Recommendations Recommendations for follow up therapy are one component of a multi-disciplinary discharge planning process, led by the attending physician.  Recommendations may be updated based on patient status, additional functional criteria and insurance authorization. Assessment: Orofacial Exam: Orofacial Exam Oral Cavity: Oral Hygiene: WFL Oral Cavity - Dentition: Dentures, top; Dentures, bottom  Orofacial Anatomy: WFL Oral Motor/Sensory Function: WFL Anatomy: Anatomy: Suspected cervical osteophytes Boluses Administered: Boluses Administered Boluses Administered: Thin liquids (Level 0); Mildly thick liquids (Level 2, nectar thick); Moderately thick liquids (Level 3, honey thick); Puree; Solid  Oral Impairment Domain: Oral Impairment Domain Lip Closure: No labial escape Tongue control during bolus hold: Cohesive bolus between tongue to palatal seal Bolus preparation/mastication: Slow prolonged chewing/mashing with complete recollection Bolus transport/lingual motion: Brisk tongue motion Oral residue: Complete oral clearance Location of oral residue : N/A Initiation of pharyngeal swallow : Valleculae  Pharyngeal Impairment Domain: Pharyngeal Impairment Domain Soft palate elevation: No bolus between soft palate (SP)/pharyngeal wall (PW) Laryngeal elevation: Complete superior movement of thyroid  cartilage with complete approximation of arytenoids to epiglottic petiole Anterior hyoid excursion: Partial anterior movement Epiglottic movement: Complete inversion Laryngeal vestibule closure: Complete, no air/contrast in laryngeal vestibule Pharyngeal stripping wave : Present - complete Pharyngeal contraction (A/P view only): N/A Pharyngoesophageal segment opening: Partial distention/partial duration, partial obstruction of flow Tongue base retraction: No contrast between tongue base and posterior pharyngeal wall (PPW) Pharyngeal residue: Collection of residue within or on pharyngeal structures Location of pharyngeal residue: Pyriform sinuses  Esophageal Impairment Domain: Esophageal Impairment Domain Esophageal clearance upright position: Esophageal retention Pill: Pill Consistency administered: Thin liquids (Level 0) Thin liquids (Level 0): Impaired (see clinical impressions) Penetration/Aspiration Scale Score: Penetration/Aspiration Scale Score 1.  Material does not enter airway: Thin liquids (Level 0); Mildly  thick liquids (Level 2, nectar thick); Moderately thick liquids (Level 3, honey thick); Puree; Solid; Pill Compensatory Strategies: Compensatory Strategies Compensatory strategies: Yes Multiple swallows: Effective Effective Multiple Swallows: Mildly thick liquid (Level 2, nectar thick); Moderately thick liquid (Level 3, honey thick); Puree; Thin liquid (Level 0); Solid   General Information: No data recorded Diet Prior to this Study: Cortrak/Small bore NG tube   Temperature : Normal   Respiratory Status: WFL   Supplemental O2: Nasal cannula   History of Recent Intubation: Yes  Behavior/Cognition: Alert; Cooperative; Pleasant mood Self-Feeding Abilities: Able to self-feed Baseline vocal quality/speech: Normal Volitional Cough: Able to elicit Volitional Swallow: Able to elicit Exam Limitations: No limitations Goal Planning: Prognosis for improved oropharyngeal function: Good No data recorded No data recorded Patient/Family Stated Goal: None stated Consulted and agree with results and recommendations: Patient Pain: Pain Assessment Pain Assessment: No/denies pain Pain Score: 0 Facial Expression: 0 Body Movements: 0 Muscle Tension: 0 Compliance with ventilator (intubated pts.): N/A Vocalization (extubated pts.): 0 CPOT Total: 0 End of Session: Start Time:SLP Start Time (ACUTE ONLY): 9147 Stop Time: SLP Stop Time (ACUTE ONLY): 0915 Time Calculation:SLP Time Calculation (min) (ACUTE ONLY): 23 min Charges: SLP Evaluations $ SLP Speech Visit: 1 Visit SLP Evaluations $BSS Swallow: 1 Procedure $MBS Swallow: 1 Procedure SLP visit diagnosis: SLP Visit Diagnosis: Dysphagia, pharyngoesophageal phase (R13.14) Past  Medical History: Past Medical History: Diagnosis Date  Anxiety disorder, unspecified   Arthritis   Asthma   as a child  Asymmetrical hearing loss of left ear 01/03/2018  Bilateral carotid artery stenosis 08/07/2015  Dr. Dorn Lesches- 04/01/15- Heterogenous plaque- 1-39% RICA stenosis.  Homogenous plaque- 40-59% LICA  stenosis.  >50% LECA stenosis.  Normal subclavian arteries bilaterally.  Patent vertebral arteries with antegrade flow.  Complication of anesthesia   slow to wake up  Constipation 08/07/2015  COPD (chronic obstructive pulmonary disease) (HCC)   Coronary artery disease involving native coronary artery of native heart without angina pectoris 04/03/2019  Displacement of lumbar intervertebral disc without myelopathy 04/14/2013  Dyspnea   Essential (primary) hypertension 04/03/2019  Golfer's elbow, right 03/30/2016  Heart murmur   History of kidney removal   left kidney . due to cancer  History of kidney stones   Hyperlipidemia 08/07/2015  Hypothyroidism due to Hashimoto's thyroiditis 07/15/2015  Leg weakness 08/07/2015  Lumbar radiculopathy 10/21/2013  Nocturia   Nonrheumatic aortic valve insufficiency 02/22/2019  Nonrheumatic aortic valve stenosis 02/22/2019  Spinal stenosis of lumbar region 05/19/2015  Transitional cell carcinoma of bladder (HCC) UROLOGIST--  DR CEIL  S/P TURBT X3--  HX BCG TX'S  Vitamin D deficiency  Past Surgical History: Past Surgical History: Procedure Laterality Date  BRONCHIAL BIOPSY  01/09/2022  Procedure: BRONCHIAL BIOPSIES;  Surgeon: Gladis Leonor HERO, MD;  Location: [facility] ENDOSCOPY;  Service: Pulmonary;;  BRONCHIAL BRUSHINGS  01/09/2022  Procedure: BRONCHIAL BRUSHINGS;  Surgeon: Gladis Leonor HERO, MD;  Location: [facility] ENDOSCOPY;  Service: Pulmonary;;  BRONCHIAL NEEDLE ASPIRATION BIOPSY  01/09/2022  Procedure: BRONCHIAL NEEDLE ASPIRATION BIOPSIES;  Surgeon: Gladis Leonor HERO, MD;  Location: [facility] ENDOSCOPY;  Service: Pulmonary;;  CYSTOSCOPY  05/10/2022  Procedure: CYSTOSCOPY WITH STENT REMOVAL;  Surgeon: Alvaro Hummer, MD;  Location: [facility] ORS;  Service: Urology;;  PHYLLIS W/ RETROGRADES Bilateral 12/13/2012  Procedure: CYSTOSCOPY WITH RETROGRADE PYELOGRAM;  Surgeon: Mark C Ottelin, MD;  Location: [facility];  Service: Urology;  Laterality: Bilateral;  CYSTOSCOPY W/ RETROGRADES  Right 04/27/2023  Procedure: CYSTOSCOPY WITH RIGHT RETROGRADE PYELOGRAM;  Surgeon: Alvaro Hummer KATHEE Mickey., MD;  Location: [facility];  Service: Urology;  Laterality: Right;  60 MINUTES NEEDED FOR CASE  CYSTOSCOPY WITH LITHOLAPAXY N/A 04/04/2013  Procedure: CYSTOSCOPY ;  Surgeon: Oneil JAYSON CEIL, MD;  Location: [facility];  Service: Urology;  Laterality: N/A;  CYSTOSCOPY WITH RETROGRADE PYELOGRAM, URETEROSCOPY AND STENT PLACEMENT Bilateral 02/28/2022  Procedure: CYSTOSCOPY WITH RETROGRADE PYELOGRAM, URETEROSCOPY, LEFT STENT PLACEMENT;  Surgeon: Rosalind Zachary KATHEE, MD;  Location: [facility];  Service: Urology;  Laterality: Bilateral;  1 HR  LUMBAR DISC SURGERY  09/20/2005  L2 -- L5  LUMBAR LAMINECTOMY/DECOMPRESSION MICRODISCECTOMY Left 01/09/2013  Procedure: Left  lumbar four-five microdiskectomy;  Surgeon: Rockey LITTIE Peru, MD;  Location: [facility];  Service: Neurosurgery;  Laterality: Left;  LUMBAR WOUND DEBRIDEMENT N/A 01/13/2013  Procedure: LUMBAR WOUND DEBRIDEMENT;  Surgeon: Victory DELENA Gunnels, MD;  Location: [facility];  Service: Neurosurgery;  Laterality: N/A;  Lumbar Wound Exploration,  Attempted Placement of Cerebrospinal Drain, and Repair of CSF Leak  ROBOT ASSITED LAPAROSCOPIC NEPHROURETERECTOMY Left 05/10/2022  Procedure: XI ROBOT ASSITED LAPAROSCOPIC NEPHROURETERECTOMY;  Surgeon: Alvaro Hummer, MD;  Location: [facility];  Service: Urology;  Laterality: Left;  TOOTH EXTRACTION    TRANSURETHRAL RESECTION OF BLADDER TUMOR  07-05-2009;   11-22-2009  bladder cancer  TRANSURETHRAL RESECTION OF BLADDER TUMOR N/A 12/13/2012  Procedure: TRANSURETHRAL RESECTION OF BLADDER TUMOR (TURBT) AND INSTILLATION OF  MYTOMYCIN;  Surgeon: Mark C Ottelin, MD;  Location: [facility];  Service: Urology;  Laterality: N/A;  TRANSURETHRAL RESECTION OF BLADDER TUMOR N/A 04/27/2023  Procedure: TRANSURETHRAL RESECTION OF BLADDER TUMOR (TURBT);  Surgeon: Alvaro Ricardo KATHEE Mickey., MD;  Location: [facility];  Service: Urology;   Laterality: N/A;  TRANSURETHRAL RESECTION OF BLADDER TUMOR WITH GYRUS (TURBT-GYRUS) N/A 04/04/2013  Procedure: TRANSURETHRAL RESECTION OF BLADDER TUMOR WITH GYRUS (TURBT-GYRUS);  Surgeon: Mark C Ottelin, MD;  Location: [facility];  Service: Urology;  Laterality: N/A; Norleen IVAR Blase, MA, CCC-SLP Speech Therapy   DG Abd 1 View Result Date: 11/24/2023 CLINICAL DATA:  Nasogastric tube placement EXAM: ABDOMEN - 1 VIEW COMPARISON:  11/23/2023 FINDINGS: Tip of the weighted enteric tube below the diaphragm just to the left of midline in the region of the mid stomach. The previous non weighted enteric tube is no longer seen. No evidence of bowel obstruction in the included upper abdomen. IMPRESSION: Tip of the weighted enteric tube below the diaphragm just to the left of midline in the region of the mid stomach. Electronically Signed   By: Andrea Gasman M.D.   On: 11/24/2023 19:45   DG Abd 1 View Result Date: 11/23/2023 CLINICAL DATA:  [id number].  Confirm orogastric tube placement. EXAM: ABDOMEN - 1 VIEW COMPARISON:  CT without contrast 01/24/2022, CTA chest yesterday FINDINGS: The bowel gas pattern is nonobstructive. The image is centered over the upper abdomen and lower chest excluding the pelvis. NGT is partially coiled in the stomach. Kinking at the side-hole is no longer seen. There is no semi-erect evidence of free air. IMPRESSION: NGT is partially coiled in the stomach. Kinking at the side-hole is no longer seen. Electronically Signed   By: Francis Quam M.D.   On: 11/23/2023 07:19   CT Angio Chest Pulmonary Embolism (PE) W or WO Contrast Result Date: 11/23/2023 CLINICAL DATA:  Patient presented earlier today with hypoxia, suspected COPD exacerbation or pneumonia, subsequently went into respiratory failure with ventilator dependence. Pulmonary embolism is suspected. Patient with long smoking history, with benign biopsy of a left upper lobe nodule August 2023. There is a cancer history in the  bladder and left kidney with trans urethral bladder tumor removal 04/04/2013, left nephroureterectomy 05/10/2022, and transurethral bladder tumor resection 04/27/2023. EXAM: CT ANGIOGRAPHY CHEST WITH CONTRAST TECHNIQUE: Multidetector CT imaging of the chest was performed using the standard protocol during bolus administration of intravenous contrast. Multiplanar CT image reconstructions and MIPs were obtained to evaluate the vascular anatomy. RADIATION DOSE REDUCTION: This exam was performed according to the departmental dose-optimization program which includes automated exposure control, adjustment of the mA and/or kV according to patient size and/or use of iterative reconstruction technique. CONTRAST:  75mL OMNIPAQUE  IOHEXOL  350 MG/ML SOLN COMPARISON:  Two portable chest films from today most recently at 7:23 p.m., chest CT without contrast 03/29/2022, and PET-CT 12/07/2021. FINDINGS: Cardiovascular: The cardiac size is normal. There is no significant pericardial fluid. There scattered 2 vessel calcific plaques in right and LAD coronary arteries. There are mild patchy calcifications in the aorta and great vessels without aneurysm, stenosis or dissection. There are moderate calcifications and thickening of the aortic valve leaflets. Consider echocardiographic follow-up of valvular function when clinically feasible. The pulmonary arteries and veins are normal in caliber. Arterial opacification is diagnostic. No arterial embolus is seen. Mediastinum/Nodes: ETT in place with tip 4 cm from the carina. NGT is partially coiled in the stomach and partially kinked at the side-hole. There is a  mucoid septation in the trachea and a fluid level layering in the right main bronchus concerning for aspiration. There are secretions in the left main bronchus without layering. Esophageal wall is unremarkable. No thyroid  mass is seen. Axillary spaces appear clear. There is no intrathoracic adenopathy. Lungs/Pleura: There is chronic  biapical pleural-parenchymal scarring, biapical paraseptal emphysematous disease. The lungs are moderately emphysematous with centrilobular changes predominating. No pleural effusion. There is diffuse bronchial thickening. There are segmental and scattered subsegmental bronchial impactions in the right lower lobe No focal pneumonia is evident, but there are multiple bilateral new pulmonary nodules highly worrisome for pulmonary metastases in a patient with a cancer history. On the right there is a 5 mm upper lobe posterior segment nodule on 6:69, 3 mm right upper lobe nodule laterally on 6:72, 3 mm and 5 mm right middle lobe nodules on images 90 and 88, respectively, 6 mm pleural-based right lower lobe nodule on 6:94, 4 mm right lower lobe nodule on 6:127, 8 mm right lower lobe nodule medially on 6:120, additional scattered tiny nodules in the right middle lobe on images 130 and 137 and right lower lobe tiny nodule on 141. On the left, elliptical apical upper lobe nodule measuring 6 x 3 mm, with an adjacent fiducial marker, is smaller than previously with the previous measurements of 8 x 4 mm. There are new nodules with a 3 mm anterior upper lobe nodule on 6:58, 4 mm nodule on image 90 anteriorly, 5 mm pleural-based nodule anteriorly on 6:108, 8 mm medial lingular nodule on 6:120, and scattered additional nodules ranging 3-5 mm in the lower lobe on images 55, 62, 75, 94, and 138. Upper Abdomen: Abdominal aortic atherosclerosis. No acute abnormality. No mass is seen liver adrenal glands. Musculoskeletal: Degenerative change thoracic spine. Calcified central disc extrusion T6-7 which was seen previously. No regional bone metastasis suspected. Unremarkable chest wall. The Review of the MIP images confirms the above findings. IMPRESSION: 1. No evidence of arterial dilatation or embolus. 2. Aortic and coronary artery atherosclerosis. 3. Moderate calcifications and thickening of the aortic valve leaflets. Consider  echocardiographic follow-up of valvular function when clinically feasible. 4. Fluid level layering in the right main bronchus concerning for aspiration. 5. Diffuse bronchial thickening with segmental and scattered subsegmental bronchial impactions in the right lower lobe. No focal pneumonia is evident. 6. Multiple new bilateral pulmonary nodules highly worrisome for pulmonary metastases. 7. Emphysema. 8. ETT in place with tip 4 cm from the carina. NGT partially coiled in the stomach and partially kinked at the side-hole. Aortic Atherosclerosis (ICD10-I70.0) and Emphysema (ICD10-J43.9). Electronically Signed   By: Francis Quam M.D.   On: 11/23/2023 00:22   DG Chest Port 1 View Result Date: 11/22/2023 CLINICAL DATA:  Central line placement EXAM: PORTABLE CHEST 1 VIEW COMPARISON:  11/22/2023 FINDINGS: An endotracheal tube has been placed with tip measuring 5.2 cm above the carina. Enteric tube is present. Tip is off the field of view but below the left hemidiaphragm. Right central venous catheter placed with tip over the mid SVC region. No pneumothorax. Heart size and pulmonary vascularity are normal. Hyperinflation of the lungs suggesting emphysema. No airspace disease or consolidation. No pleural effusion or pneumothorax. Mediastinal contours appear intact. Calcification of the aorta. IMPRESSION: Appliances appear in satisfactory position. Emphysematous changes in the lungs. No focal consolidation. Electronically Signed   By: Elsie Gravely M.D.   On: 11/22/2023 19:31   ECHOCARDIOGRAM COMPLETE Result Date: 11/22/2023    ECHOCARDIOGRAM REPORT   Patient Name:  Malahki D Mcclatchy Date of Exam: 11/22/2023 Medical Rec #:  [id number]     Height:       69.0 in Accession #:    [id number]    Weight:       120.0 lb Date of Birth:  April 20, 1956     BSA:          1.663 m Patient Age:    69 years      BP:           126/63 mmHg Patient Gender: M             HR:           89 bpm. Exam Location:  Inpatient Procedure: 2D Echo,  Color Doppler, Cardiac Doppler and Intracardiac            Opacification Agent (Both Spectral and Color Flow Doppler were            utilized during procedure). STAT ECHO Indications:    I26.02 Pulmonary embolus  History:        Patient has prior history of Echocardiogram examinations, most                 recent 08/01/2020. COPD; Risk Factors:Hypertension and                 Dyslipidemia.  Sonographer:    Damien Senior RDCS Referring Phys: [id number] PETER E BABCOCK  Sonographer Comments: Technically difficult due to COPD IMPRESSIONS  1. Left ventricular ejection fraction, by estimation, is 65 to 70%. The left ventricle has normal function. The left ventricle has no regional wall motion abnormalities. Left ventricular diastolic parameters are consistent with Grade I diastolic dysfunction (impaired relaxation).  2. Right ventricular systolic function is normal. The right ventricular size is normal. Tricuspid regurgitation signal is inadequate for assessing PA pressure.  3. The mitral valve is normal in structure. Trivial mitral valve regurgitation.  4. The aortic valve is tricuspid. There is moderate calcification of the aortic valve. Aortic valve regurgitation is mild. Mild to moderate aortic valve stenosis. Aortic valve area, by VTI measures 1.10 cm. Aortic valve mean gradient measures 23.0 mmHg. Aortic valve Vmax measures 2.98 m/s.  5. The inferior vena cava is dilated in size with <50% respiratory variability, suggesting right atrial pressure of 15 mmHg. FINDINGS  Left Ventricle: Left ventricular ejection fraction, by estimation, is 65 to 70%. The left ventricle has normal function. The left ventricle has no regional wall motion abnormalities. Definity  contrast agent was given IV to delineate the left ventricular  endocardial borders. The left ventricular internal cavity size was normal in size. There is no left ventricular hypertrophy. Left ventricular diastolic parameters are consistent with Grade I diastolic  dysfunction (impaired relaxation). Right Ventricle: The right ventricular size is normal. No increase in right ventricular wall thickness. Right ventricular systolic function is normal. Tricuspid regurgitation signal is inadequate for assessing PA pressure. Left Atrium: Left atrial size was normal in size. Right Atrium: Right atrial size was normal in size. Pericardium: There is no evidence of pericardial effusion. Mitral Valve: The mitral valve is normal in structure. Trivial mitral valve regurgitation. Tricuspid Valve: The tricuspid valve is normal in structure. Tricuspid valve regurgitation is trivial. Aortic Valve: The aortic valve is tricuspid. There is moderate calcification of the aortic valve. Aortic valve regurgitation is mild. Mild to moderate aortic stenosis is present. Aortic valve mean gradient measures 23.0 mmHg. Aortic valve peak gradient measures 35.5 mmHg. Aortic valve area, by VTI measures  1.10 cm. Pulmonic Valve: The pulmonic valve was not well visualized. Pulmonic valve regurgitation is not visualized. Aorta: The aortic root is normal in size and structure and the ascending aorta was not well visualized. Venous: The inferior vena cava is dilated in size with less than 50% respiratory variability, suggesting right atrial pressure of 15 mmHg. IAS/Shunts: No atrial level shunt detected by color flow Doppler.  LEFT VENTRICLE PLAX 2D LVIDd:         4.00 cm   Diastology LVIDs:         3.10 cm   LV e' medial:    4.90 cm/s LV PW:         1.00 cm   LV E/e' medial:  13.5 LV IVS:        0.80 cm   LV e' lateral:   6.96 cm/s LVOT diam:     2.00 cm   LV E/e' lateral: 9.5 LV SV:         61 LV SV Index:   36 LVOT Area:     3.14 cm  RIGHT VENTRICLE RV S prime:     13.90 cm/s TAPSE (M-mode): 1.8 cm LEFT ATRIUM             Index        RIGHT ATRIUM          Index LA diam:        2.70 cm 1.62 cm/m   RA Area:     8.95 cm LA Vol (A2C):   18.4 ml 11.07 ml/m  RA Volume:   17.90 ml 10.76 ml/m LA Vol (A4C):   23.4  ml 14.07 ml/m LA Biplane Vol: 22.4 ml 13.47 ml/m  AORTIC VALVE AV Area (Vmax):    0.92 cm AV Area (Vmean):   0.85 cm AV Area (VTI):     1.10 cm AV Vmax:           298.00 cm/s AV Vmean:          231.000 cm/s AV VTI:            0.551 m AV Peak Grad:      35.5 mmHg AV Mean Grad:      23.0 mmHg LVOT Vmax:         87.50 cm/s LVOT Vmean:        62.400 cm/s LVOT VTI:          0.193 m LVOT/AV VTI ratio: 0.35  AORTA Ao Root diam: 2.80 cm MITRAL VALVE MV Area (PHT): 3.02 cm    SHUNTS MV Decel Time: 251 msec    Systemic VTI:  0.19 m MV E velocity: 66.00 cm/s  Systemic Diam: 2.00 cm MV A velocity: 85.70 cm/s MV E/A ratio:  0.77 Toribio Fuel MD Electronically signed by Toribio Fuel MD Signature Date/Time: 11/22/2023/6:47:01 PM    Final    DG Chest Portable 1 View Result Date: 11/22/2023 CLINICAL DATA:  Hypoxia EXAM: PORTABLE CHEST 1 VIEW COMPARISON:  Chest radiograph dated 01/09/2022 FINDINGS: Hyperinflated lungs. Left apical surgical clip. Patient is rotated slightly to the right. No focal consolidations. No pleural effusion or pneumothorax. The heart size and mediastinal contours are within normal limits. No acute osseous abnormality. IMPRESSION: Hyperinflated lungs, which can be seen in the setting of COPD. No focal consolidations. Electronically Signed   By: Limin  Xu M.D.   On: 11/22/2023 12:06    Microbiology: Results for orders placed or performed during the hospital encounter of 11/22/23  MRSA Next  Gen by PCR, Nasal     Status: None   Collection Time: 11/22/23  7:06 PM   Specimen: Nasal Mucosa; Nasal Swab  Result Value Ref Range Status   MRSA by PCR Next Gen NOT DETECTED NOT DETECTED Final    Comment: (NOTE) The GeneXpert MRSA Assay (FDA approved for NASAL specimens only), is one component of a comprehensive MRSA colonization surveillance program. It is not intended to diagnose MRSA infection nor to guide or monitor treatment for MRSA infections. Test performance is not FDA approved in  patients less than 93 years old. Performed at [facility], [address], [place], [place] [address]   Respiratory (~20 pathogens) panel by PCR     Status: None   Collection Time: 11/22/23  7:07 PM   Specimen: Nasopharyngeal Swab; Respiratory  Result Value Ref Range Status   Adenovirus NOT DETECTED NOT DETECTED Final   Coronavirus 229E NOT DETECTED NOT DETECTED Final    Comment: (NOTE) The Coronavirus on the Respiratory Panel, DOES NOT test for the novel  Coronavirus (2019 nCoV)    Coronavirus HKU1 NOT DETECTED NOT DETECTED Final   Coronavirus NL63 NOT DETECTED NOT DETECTED Final   Coronavirus OC43 NOT DETECTED NOT DETECTED Final   Metapneumovirus NOT DETECTED NOT DETECTED Final   Rhinovirus / Enterovirus NOT DETECTED NOT DETECTED Final   Influenza A NOT DETECTED NOT DETECTED Final   Influenza B NOT DETECTED NOT DETECTED Final   Parainfluenza Virus 1 NOT DETECTED NOT DETECTED Final   Parainfluenza Virus 2 NOT DETECTED NOT DETECTED Final   Parainfluenza Virus 3 NOT DETECTED NOT DETECTED Final   Parainfluenza Virus 4 NOT DETECTED NOT DETECTED Final   Respiratory Syncytial Virus NOT DETECTED NOT DETECTED Final   Bordetella pertussis NOT DETECTED NOT DETECTED Final   Bordetella Parapertussis NOT DETECTED NOT DETECTED Final   Chlamydophila pneumoniae NOT DETECTED NOT DETECTED Final   Mycoplasma pneumoniae NOT DETECTED NOT DETECTED Final    Comment: Performed at [facility], [address], [place], [place] [address]  Culture, blood (Routine X 2) w Reflex to ID Panel     Status: None   Collection Time: 11/23/23  9:44 AM   Specimen: BLOOD  Result Value Ref Range Status   Specimen Description   Final    BLOOD LEFT ANTECUBITAL Performed at [facility], [address] [place], [place] [address]    Special Requests   Final    BOTTLES DRAWN AEROBIC AND ANAEROBIC Blood Culture adequate volume Performed at [facility],  [address] [place], [place] [address]    Culture   Final    NO GROWTH 5 DAYS Performed at [facility], [address] [place], [place] [address]    Report Status 11/28/2023 FINAL  Final  Culture, blood (Routine X 2) w Reflex to ID Panel     Status: None   Collection Time: 11/23/23  9:48 AM   Specimen: BLOOD RIGHT HAND  Result Value Ref Range Status   Specimen Description   Final    BLOOD RIGHT HAND Performed at [facility], [address] [place], [place] [address]    Special Requests   Final    BOTTLES DRAWN AEROBIC AND ANAEROBIC Blood Culture adequate volume Performed at [facility], [address] [place], [place] [address]    Culture   Final    NO GROWTH 5 DAYS Performed at [facility], [address], [place], [place] [address]    Report Status 11/28/2023 FINAL  Final  Culture, Respiratory w Gram Stain     Status: None   Collection Time: 11/23/23 11:07 AM   Specimen: Tracheal Aspirate; Respiratory  Result Value Ref Range Status   Specimen Description   Final    TRACHEAL ASPIRATE Performed at [facility], [address] [place], [place] [address]    Special Requests   Final    NONE Performed at [facility], [address] [place], [place] [address]    Gram Stain   Final    ABUNDANT WBC PRESENT, PREDOMINANTLY PMN NO ORGANISMS SEEN    Culture   Final    NO GROWTH 2 DAYS Performed at [facility], [address] [place], [place] [address]    Report Status 11/25/2023 FINAL  Final    Labs: CBC: Recent Labs  Lab 11/24/23 0455 11/25/23 0330 11/26/23 1114 11/27/23 0308 11/28/23 1230  WBC 19.4* 18.1* 13.9* 13.9* 12.0*  HGB 11.5* 11.8* 11.5* 10.7* 11.3*  HCT 34.7* 36.2* 36.3* 33.1* 34.8*  MCV 97.2 99.2 102.8* 99.7 98.0  PLT 150 170 148* 149* 153   Basic Metabolic Panel: Recent Labs  Lab 11/24/23 0455 11/24/23 1653 11/25/23 0330 11/26/23 1114 11/27/23 0308  11/28/23 1230  NA  --   --  140 143 140 139  K  --   --  4.4 4.7 4.4 4.0  CL  --   --  104 109 104 102  CO2  --   --  23 24 29 29   GLUCOSE  --   --  123* 137* 99 124*  BUN  --   --  42* 45* 49* 38*  CREATININE  --   --  1.51* 1.19 1.17 1.08  CALCIUM  --   --  9.0 9.0 8.6* 7.8*  MG 2.2 2.3 2.5* 2.6*  --   --   PHOS 4.1 4.9* 4.1 2.7  --   --    Liver Function Tests: No results for input(s): AST, ALT, ALKPHOS, BILITOT, PROT, ALBUMIN in the last 168 hours. CBG: Recent Labs  Lab 11/27/23 2018 11/28/23 0008 11/28/23 0443 11/28/23 1154 11/28/23 1647  GLUCAP 122* 106* 85 124* 162*    Discharge time spent: 42 minutes.   Signed: Elgie Butter, MD [facility] 12/01/2023

## 2023-12-12 ENCOUNTER — Ambulatory Visit (INDEPENDENT_AMBULATORY_CARE_PROVIDER_SITE_OTHER): Admitting: Pulmonary Disease

## 2023-12-12 ENCOUNTER — Encounter: Payer: Self-pay | Admitting: Pulmonary Disease

## 2023-12-12 VITALS — BP 112/40 | HR 90 | Ht 70.0 in | Wt 130.0 lb

## 2023-12-12 DIAGNOSIS — C679 Malignant neoplasm of bladder, unspecified: Secondary | ICD-10-CM | POA: Diagnosis not present

## 2023-12-12 DIAGNOSIS — R911 Solitary pulmonary nodule: Secondary | ICD-10-CM

## 2023-12-12 DIAGNOSIS — J432 Centrilobular emphysema: Secondary | ICD-10-CM

## 2023-12-12 NOTE — Patient Instructions (Addendum)
 We will schedule you for CT Chest scan in early September to follow up on the lung nodules.  I will discuss with Dr. Alvaro   Follow up in September after CT Chest scan  Continue Breztri  and then go back to breo ellipta   Follow up in 6-7 weeks

## 2023-12-12 NOTE — Progress Notes (Unsigned)
 Subjective:   PATIENT ID: Harry Young GENDER: male DOB: 1955/06/19, MRN: [id number]   HPI Discussed the use of AI scribe software for clinical note transcription with the patient, who gave verbal consent to proceed.  Harry Young is a 68 year old male, recent former smoker with history of bladder cancer, COPD/Emphysema, lung nodules, and CAD who returns to pulmonary clinic for hospital follow up.  He was admitted 7/10 to 7/16 for acute hypoxemic respiratory failure due to COPD exacerbation. He did require intubation and mechanical ventilation. He was discharged on supplemental oxygen which is new for him. On simple walk today he did not desaturate. CTA Chest showed multiple new bilateral pulmonary nodules.  He quit smoking following his recent hospitalization. He is concerned about the portability of his oxygen setup.  He experiences minimal wheezing and a slight cough with little mucus production. He uses Breo, one puff daily, and has Breztri  from the hospital but notes no significant difference between the two inhalers. Insurance coverage issues limit his access to certain treatments.  He does not use CPAP or BiPAP at night but wears oxygen while sleeping. He has noticed new swelling in both feet, although his left foot has previously experienced mild swelling.    Past Medical History:  Diagnosis Date   Anxiety disorder, unspecified    Arthritis    Asthma    as a child   Asymmetrical hearing loss of left ear 01/03/2018   Bilateral carotid artery stenosis 08/07/2015   Dr. Dorn Lesches- 04/01/15- Heterogenous plaque- 1-39% RICA stenosis.  Homogenous plaque- 40-59% LICA stenosis.  >50% LECA stenosis.  Normal subclavian arteries bilaterally.  Patent vertebral arteries with antegrade flow.   Complication of anesthesia    slow to wake up   Constipation 08/07/2015   COPD (chronic obstructive pulmonary disease) (HCC)    Coronary artery disease involving native coronary artery of  native heart without angina pectoris 04/03/2019   Displacement of lumbar intervertebral disc without myelopathy 04/14/2013   Dyspnea    Essential (primary) hypertension 04/03/2019   Golfer's elbow, right 03/30/2016   Heart murmur    History of kidney removal    left kidney . due to cancer   History of kidney stones    Hyperlipidemia 08/07/2015   Hypothyroidism due to Hashimoto's thyroiditis 07/15/2015   Leg weakness 08/07/2015   Lumbar radiculopathy 10/21/2013   Nocturia    Nonrheumatic aortic valve insufficiency 02/22/2019   Nonrheumatic aortic valve stenosis 02/22/2019   Spinal stenosis of lumbar region 05/19/2015   Transitional cell carcinoma of bladder (HCC) UROLOGIST--  DR CEIL   S/P TURBT X3--  HX BCG TX'S   Vitamin D deficiency      Family History  Problem Relation Age of Onset   Hyperlipidemia Mother    Heart disease Father    Heart failure Father    COPD Father    Diabetes Brother      Social History   Socioeconomic History   Marital status: Married    Spouse name: Not on file   Number of children: 0   Years of education: Not on file   Highest education level: Not on file  Occupational History   Not on file  Tobacco Use   Smoking status: Every Day    Current packs/day: 1.00    Average packs/day: 1 pack/day for 55.4 years (55.4 ttl pk-yrs)    Types: Cigarettes    Start date: 07/04/1968   Smokeless tobacco: Never  Vaping Use  Vaping status: Never Used  Substance and Sexual Activity   Alcohol use: No   Drug use: No   Sexual activity: Not Currently  Other Topics Concern   Not on file  Social History Narrative   Not on file   Social Drivers of Health   Financial Resource Strain: Not on file  Food Insecurity: No Food Insecurity (11/30/2023)   Hunger Vital Sign    Worried About Running Out of Food in the Last Year: Never true    Ran Out of Food in the Last Year: Never true  Transportation Needs: No Transportation Needs (11/30/2023)   PRAPARE -  Administrator, Civil Service (Medical): No    Lack of Transportation (Non-Medical): No  Physical Activity: Not on file  Stress: Not on file  Social Connections: Patient Unable To Answer (11/23/2023)   Social Connection and Isolation Panel    Frequency of Communication with Friends and Family: Patient unable to answer    Frequency of Social Gatherings with Friends and Family: Patient unable to answer    Attends Religious Services: Patient unable to answer    Active Member of Clubs or Organizations: Patient unable to answer    Attends Banker Meetings: Patient unable to answer    Marital Status: Patient unable to answer  Intimate Partner Violence: Not At Risk (11/30/2023)   Humiliation, Afraid, Rape, and Kick questionnaire    Fear of Current or Ex-Partner: No    Emotionally Abused: No    Physically Abused: No    Sexually Abused: No     Allergies  Allergen Reactions   Crestor [Rosuvastatin] Other (See Comments)    Urinary retention.   Sulfa Antibiotics Rash     Outpatient Medications Prior to Visit  Medication Sig Dispense Refill   albuterol  (VENTOLIN  HFA) 108 (90 Base) MCG/ACT inhaler Inhale 1-2 puffs into the lungs every 6 (six) hours as needed for shortness of breath or wheezing.     amLODipine  (NORVASC ) 2.5 MG tablet Take 2.5 mg by mouth daily.     BREO ELLIPTA  200-25 MCG/ACT AEPB Inhale 1 puff into the lungs daily.     Calcium-Magnesium -Zinc (CAL-MAG-ZINC PO) Take 1 tablet by mouth daily.     Cholecalciferol (VITAMIN D3) 25 MCG (1000 UT) CAPS Take 1,000 Units by mouth daily.     Coenzyme Q10 (CO Q 10 PO) Take 1 capsule by mouth daily.     ezetimibe (ZETIA) 10 MG tablet Take 10 mg by mouth at bedtime.     feeding supplement (ENSURE PLUS HIGH PROTEIN) LIQD Take 237 mLs by mouth 2 (two) times daily between meals. 14220 mL 2   guaiFENesin  (ROBITUSSIN) 100 MG/5ML liquid Take 10 mLs by mouth 3 (three) times daily. 120 mL 0   Homeopathic Products (RESTFUL LEGS  SL) Place 2 tablets under the tongue at bedtime.     HYDROcodone -acetaminophen  (NORCO) 10-325 MG tablet Take 1 tablet by mouth 3 (three) times daily as needed for moderate pain (pain score 4-6).     ipratropium-albuterol  (DUONEB) 0.5-2.5 (3) MG/3ML SOLN Take 3 mLs by nebulization every 4 (four) hours as needed. 360 mL 2   levothyroxine  (SYNTHROID ) 75 MCG tablet Take 75 mcg by mouth daily before breakfast.     nicotine  (NICODERM CQ  - DOSED IN MG/24 HOURS) 21 mg/24hr patch Place 1 patch (21 mg total) onto the skin daily. 28 patch 0   Omega-3 Fatty Acids (FISH OIL) 500 MG CAPS Take 500 mg by mouth daily.  rOPINIRole  (REQUIP ) 1 MG tablet Take 1 tablet (1 mg total) by mouth 3 (three) times daily. 90 tablet 1   simvastatin (ZOCOR) 5 MG tablet Take 5 mg by mouth at bedtime.     No facility-administered medications prior to visit.    Review of Systems  Constitutional:  Negative for chills, fever, malaise/fatigue and weight loss.  HENT:  Negative for congestion, sinus pain and sore throat.   Eyes: Negative.   Respiratory:  Positive for cough, shortness of breath and wheezing. Negative for hemoptysis and sputum production.   Cardiovascular:  Negative for chest pain, palpitations, orthopnea, claudication and leg swelling.  Gastrointestinal:  Negative for abdominal pain, heartburn, nausea and vomiting.  Genitourinary: Negative.   Musculoskeletal:  Negative for joint pain and myalgias.  Skin:  Negative for rash.  Neurological:  Negative for weakness.  Endo/Heme/Allergies: Negative.   Psychiatric/Behavioral: Negative.     Objective:   Vitals:   12/12/23 1447  BP: (!) 112/40  Pulse: 90  SpO2: 100%  Weight: 130 lb (59 kg)  Height: 5' 10 (1.778 m)  PF: (!) 2 L/min   Physical Exam Constitutional:      General: He is not in acute distress.    Appearance: Normal appearance.  Eyes:     General: No scleral icterus.    Conjunctiva/sclera: Conjunctivae normal.  Cardiovascular:     Rate and  Rhythm: Normal rate and regular rhythm.  Pulmonary:     Breath sounds: No wheezing, rhonchi or rales.  Musculoskeletal:     Right lower leg: No edema.     Left lower leg: No edema.  Skin:    General: Skin is warm and dry.  Neurological:     General: No focal deficit present.    CBC    Component Value Date/Time   WBC 12.0 (H) 11/28/2023 1230   RBC 3.55 (L) 11/28/2023 1230   HGB 11.3 (L) 11/28/2023 1230   HCT 34.8 (L) 11/28/2023 1230   PLT 153 11/28/2023 1230   MCV 98.0 11/28/2023 1230   MCH 31.8 11/28/2023 1230   MCHC 32.5 11/28/2023 1230   RDW 13.9 11/28/2023 1230   LYMPHSABS 2.3 01/12/2013 2115   MONOABS 1.1 (H) 01/12/2013 2115   EOSABS 0.0 01/12/2013 2115   BASOSABS 0.0 01/12/2013 2115     Chest imaging:  PFT:     No data to display          Labs:  Path:  Echo:  Heart Catheterization:       Assessment & Plan:   Centrilobular emphysema (HCC)  Pulmonary nodule - Plan: CT Chest Wo Contrast  Transitional cell carcinoma of bladder (HCC) Assessment and Plan   Harry Young is a 67 year old male, recent former smoker with history of bladder cancer, COPD/Emphysema, lung nodules, and CAD who returns to pulmonary clinic for hospital follow up.  Chronic obstructive pulmonary disease (COPD) with home oxygen therapy - He did not desaturate with simple walk today in clinic, unable to qualify for POC today - Continue Breo inhaler as prescribed.  Multiple new lung nodules, possible metastatic disease Multiple new lung nodules on CT scan, 5-8 mm. Differential includes infection, mucus, inflammation, or metastasis from bladder cancer.  - Repeat CT scan in 6 weeks to assess for changes in lung nodules. - Will discuss case with patient's urology team  Bladder cancer - Bladder cancer with surgery scheduled for August 20th with urology - will discuss case with urology team  Bilateral lower extremity edema New  onset bilateral lower extremity edema. Cardiac  etiology considered but recent ultrasound normal. Kidney function normal - Encourage elevation of legs during the day to reduce swelling.  Nicotine  dependence, currently abstinent Successfully abstinent from smoking since recent hospitalization. Advised on potential increased cough and mucus production as lungs recover. - Encourage continued smoking cessation.  Follow up in 6-7 weeks after CT Chest scan  Dorn Chill, MD [facility] Office: [phone]   Current Outpatient Medications:    albuterol  (VENTOLIN  HFA) 108 (90 Base) MCG/ACT inhaler, Inhale 1-2 puffs into the lungs every 6 (six) hours as needed for shortness of breath or wheezing., Disp: , Rfl:    amLODipine  (NORVASC ) 2.5 MG tablet, Take 2.5 mg by mouth daily., Disp: , Rfl:    BREO ELLIPTA  200-25 MCG/ACT AEPB, Inhale 1 puff into the lungs daily., Disp: , Rfl:    Calcium-Magnesium -Zinc (CAL-MAG-ZINC PO), Take 1 tablet by mouth daily., Disp: , Rfl:    Cholecalciferol (VITAMIN D3) 25 MCG (1000 UT) CAPS, Take 1,000 Units by mouth daily., Disp: , Rfl:    Coenzyme Q10 (CO Q 10 PO), Take 1 capsule by mouth daily., Disp: , Rfl:    ezetimibe (ZETIA) 10 MG tablet, Take 10 mg by mouth at bedtime., Disp: , Rfl:    feeding supplement (ENSURE PLUS HIGH PROTEIN) LIQD, Take 237 mLs by mouth 2 (two) times daily between meals., Disp: 14220 mL, Rfl: 2   guaiFENesin  (ROBITUSSIN) 100 MG/5ML liquid, Take 10 mLs by mouth 3 (three) times daily., Disp: 120 mL, Rfl: 0   Homeopathic Products (RESTFUL LEGS SL), Place 2 tablets under the tongue at bedtime., Disp: , Rfl:    HYDROcodone -acetaminophen  (NORCO) 10-325 MG tablet, Take 1 tablet by mouth 3 (three) times daily as needed for moderate pain (pain score 4-6)., Disp: , Rfl:    ipratropium-albuterol  (DUONEB) 0.5-2.5 (3) MG/3ML SOLN, Take 3 mLs by nebulization every 4 (four) hours as needed., Disp: 360 mL, Rfl: 2   levothyroxine  (SYNTHROID ) 75 MCG tablet, Take 75 mcg by mouth daily  before breakfast., Disp: , Rfl:    nicotine  (NICODERM CQ  - DOSED IN MG/24 HOURS) 21 mg/24hr patch, Place 1 patch (21 mg total) onto the skin daily., Disp: 28 patch, Rfl: 0   Omega-3 Fatty Acids (FISH OIL) 500 MG CAPS, Take 500 mg by mouth daily., Disp: , Rfl:    rOPINIRole  (REQUIP ) 1 MG tablet, Take 1 tablet (1 mg total) by mouth 3 (three) times daily., Disp: 90 tablet, Rfl: 1   simvastatin (ZOCOR) 5 MG tablet, Take 5 mg by mouth at bedtime., Disp: , Rfl:

## 2023-12-13 ENCOUNTER — Encounter: Payer: Self-pay | Admitting: Pulmonary Disease

## 2023-12-24 NOTE — Progress Notes (Addendum)
 PCP -  Cardiologist - Elmira Penman, MDLOV 07-28-19 epic PULM-  Dorn Chill, MD [facility] 12-12-23  PPM/ICD -  Device Orders -  Rep Notified -   Chest x-ray - 11-22-23 epic  CTA chest 11-23-23 epic EKG - 11-22-23 epic Stress Test -  ECHO - 11-22-23 epic Cardiac Cath -   Sleep Study -  CPAP -   Fasting Blood Sugar -  Checks Blood Sugar _____ times a day  Blood Thinner Instructions: Aspirin Instructions:  ERAS Protcol - PRE-SURGERY n/a   COVID vaccine -  Activity-- Anesthesia review: CAD, Murmur, aortic valve  stenosis, HTN, COPD exacerbation 11-22-23 in PST sent to ED, O2 at hs  Patient denies shortness of breath, fever, cough and chest pain at PAT appointment   All instructions explained to the patient, with a verbal understanding of the material. Patient agrees to go over the instructions while at home for a better understanding. Patient also instructed to self quarantine after being tested for COVID-19. The opportunity to ask questions was provided.

## 2023-12-24 NOTE — Patient Instructions (Addendum)
 SURGICAL WAITING ROOM VISITATION  Patients having surgery or a procedure may have no more than 2 support people in the waiting area - these visitors may rotate.    Children under the age of 56 must have an adult with them who is not the patient.  Visitors with respiratory illnesses are discouraged from visiting and should remain at home.  If the patient needs to stay at the hospital during part of their recovery, the visitor guidelines for inpatient rooms apply. Pre-op nurse will coordinate an appropriate time for 1 support person to accompany patient in pre-op.  This support person may not rotate.    Please refer to the [facility] website for the visitor guidelines for Inpatients (after your surgery is over and you are in a regular room).       Your procedure is scheduled on: 01-02-24   Report to [facility] Main Entrance    Report to admitting at      2:15   PM   Call this number if you have problems the morning of surgery [phone]   Do not eat food :After Midnight.   After Midnight you may have the following liquids until _12:30_____ PM/  DAY OF SURGERY   then nothing by mouth  Water              Gatorade NO RED             If you have questions, please contact your surgeon's office.   FOLLOW  ANY ADDITIONAL PRE OP INSTRUCTIONS YOU RECEIVED FROM YOUR SURGEON'S OFFICE!!!     Oral Hygiene is also important to reduce your risk of infection.                                    Remember - BRUSH YOUR TEETH THE MORNING OF SURGERY WITH YOUR REGULAR TOOTHPASTE  DENTURES WILL BE REMOVED PRIOR TO SURGERY PLEASE DO NOT APPLY Poly grip OR ADHESIVES!!!   Do NOT smoke after Midnight   Stop all vitamins and herbal supplements 7 days before surgery.   Take these medicines the morning of surgery with A SIP OF WATER : amlodipine , levothyroxine , inhalers, bring rescue inhaler with you, zetia, mucinex , hydrocodone  if needed    Bring CPAP mask and tubing day of surgery.                               You may not have any metal on your body including hair pins, jewelry, and body piercing             Do not wear lotions, powders, /cologne, or deodorant              Men may shave face and neck.   Do not bring valuables to the hospital. [facility] IS NOT             RESPONSIBLE   FOR VALUABLES.   Contacts, glasses, dentures or bridgework may not be worn into surgery.   Bring small overnight bag day of surgery.   DO NOT BRING YOUR HOME MEDICATIONS TO THE HOSPITAL. PHARMACY WILL DISPENSE MEDICATIONS LISTED ON YOUR MEDICATION LIST TO YOU DURING YOUR ADMISSION IN THE HOSPITAL!    Patients discharged on the day of surgery will not be allowed to drive home.  Someone NEEDS to stay with you for the first 24 hours after  anesthesia.   Special Instructions: Bring a copy of your healthcare power of attorney and living will documents the day of surgery if you haven't scanned them before.              Please read over the following fact sheets you were given: IF YOU HAVE QUESTIONS ABOUT YOUR PRE-OP INSTRUCTIONS PLEASE CALL [phone].   If you test positive for Covid or have been in contact with anyone that has tested positive in the last 10 days please notify you surgeon.    [facility] - Preparing for Surgery Before surgery, you can play an important role.  Because skin is not sterile, your skin needs to be as free of germs as possible.  You can reduce the number of germs on your skin by washing with CHG (chlorahexidine gluconate) soap before surgery.  CHG is an antiseptic cleaner which kills germs and bonds with the skin to continue killing germs even after washing. Please DO NOT use if you have an allergy to CHG or antibacterial soaps.  If your skin becomes reddened/irritated stop using the CHG and inform your nurse when you arrive at Short Stay. Do not shave (including legs and underarms) for at least 48 hours prior to the first CHG shower.  You may shave your  face/neck. Please follow these instructions carefully:  1.  Shower with CHG Soap the night before surgery and the  morning of Surgery.  2.  If you choose to wash your hair, wash your hair first as usual with your  normal  shampoo.  3.  After you shampoo, rinse your hair and body thoroughly to remove the  shampoo.                            4.  Use CHG as you would any other liquid soap.  You can apply chg directly  to the skin and wash                       Gently with a scrungie or clean washcloth.  5.  Apply the CHG Soap to your body ONLY FROM THE NECK DOWN.   Do not use on face/ open                           Wound or open sores. Avoid contact with eyes, ears mouth and genitals (private parts).                       Wash face,  Genitals (private parts) with your normal soap.             6.  Wash thoroughly, paying special attention to the area where your surgery  will be performed.  7.  Thoroughly rinse your body with warm water  from the neck down.  8.  DO NOT shower/wash with your normal soap after using and rinsing off  the CHG Soap.                9.  Pat yourself dry with a clean towel.            10.  Wear clean pajamas.            11.  Place clean sheets on your bed the night of your first shower and do not  sleep with pets. Day of Surgery : Do not apply  any lotions/deodorants the morning of surgery.  Please wear clean clothes to the hospital/surgery center.  FAILURE TO FOLLOW THESE INSTRUCTIONS MAY RESULT IN THE CANCELLATION OF YOUR SURGERY PATIENT SIGNATURE_________________________________  NURSE SIGNATURE__________________________________  ________________________________________________________________________

## 2023-12-28 ENCOUNTER — Encounter (HOSPITAL_COMMUNITY)
Admission: RE | Admit: 2023-12-28 | Discharge: 2023-12-28 | Disposition: A | Discharge status: Home / Self Care | Source: Ambulatory Visit

## 2023-12-28 NOTE — Patient Instructions (Signed)
 SURGICAL WAITING ROOM VISITATION Patients having surgery or a procedure may have no more than 2 support people in the waiting area - these visitors may rotate in the visitor waiting room.   Due to an increase in RSV and influenza rates and associated hospitalizations, children ages 42 and under may not visit patients in [facility] hospitals. If the patient needs to stay at the hospital during part of their recovery, the visitor guidelines for inpatient rooms apply.  PRE-OP VISITATION  Pre-op nurse will coordinate an appropriate time for 1 support person to accompany the patient in pre-op.  This support person may not rotate.  This visitor will be contacted when the time is appropriate for the visitor to come back in the pre-op area.  Please refer to the [facility] website for the visitor guidelines for Inpatients (after your surgery is over and you are in a regular room).  You are not required to quarantine at this time prior to your surgery. However, you must do this: Hand Hygiene often Do NOT share personal items Notify your provider if you are in close contact with someone who has COVID or you develop fever 100.4 or greater, new onset of sneezing, cough, sore throat, shortness of breath or body aches.  If you test positive for Covid or have been in contact with anyone that has tested positive in the last 10 days please notify you surgeon.    Your procedure is scheduled on:  01/02/24  Report to [facility] Main Entrance: [place] entrance where the [employer] is available.   Report to admitting at: 9:45 AM  Call this number if you have any questions or problems the morning of surgery [phone]  FOLLOW ANY ADDITIONAL PRE OP INSTRUCTIONS YOU RECEIVED FROM YOUR SURGEON'S OFFICE!!!  Do not eat food or drink after Midnight the night prior to your surgery/procedure.   Oral Hygiene is also important to reduce your risk of infection.        Remember - BRUSH YOUR TEETH THE  MORNING OF SURGERY WITH YOUR REGULAR TOOTHPASTE  Do NOT smoke after Midnight the night before surgery.  STOP TAKING all Vitamins, Herbs and supplements 1 week before your surgery.   Take ONLY these medicines the morning of surgery with A SIP OF WATER :amlodipine ,levothyroxine .Use inhalers as usual and bring them.   If You have been diagnosed with Sleep Apnea - Bring CPAP mask and tubing day of surgery. We will provide you with a CPAP machine on the day of your surgery.                   You may not have any metal on your body including hair pins, jewelry, and body piercing  Do not wear lotions, powders, perfumes / cologne, or deodorant  Men may shave face and neck.  Contacts, Hearing Aids, dentures or bridgework may not be worn into surgery. DENTURES WILL BE REMOVED PRIOR TO SURGERY PLEASE DO NOT APPLY Poly grip OR ADHESIVES!!!  You may bring a small overnight bag with you on the day of surgery, only pack items that are not valuable. [facility] IS NOT RESPONSIBLE   FOR VALUABLES THAT ARE LOST OR STOLEN.   Patients discharged on the day of surgery will not be allowed to drive home.  Someone NEEDS to stay with you for the first 24 hours after anesthesia.  Do not bring your home medications to the hospital. The Pharmacy will dispense medications listed on your medication list to you  during your admission in the Hospital.  Special Instructions: Bring a copy of your healthcare power of attorney and living will documents the day of surgery, if you wish to have them scanned into your [facility] Records- EPIC  Please read over the following fact sheets you were given: IF YOU HAVE QUESTIONS ABOUT YOUR PRE-OP INSTRUCTIONS, PLEASE CALL [phone]   [facility] - Preparing for Surgery Before surgery, you can play an important role.  Because skin is not sterile, your skin needs to be as free of germs as possible.  You can reduce the number of germs on your skin by washing with CHG  (chlorahexidine gluconate) soap before surgery.  CHG is an antiseptic cleaner which kills germs and bonds with the skin to continue killing germs even after washing. Please DO NOT use if you have an allergy to CHG or antibacterial soaps.  If your skin becomes reddened/irritated stop using the CHG and inform your nurse when you arrive at Short Stay. Do not shave (including legs and underarms) for at least 48 hours prior to the first CHG shower.  You may shave your face/neck.  Please follow these instructions carefully:  1.  Shower with CHG Soap the night before surgery and the  morning of surgery.  2.  If you choose to wash your hair, wash your hair first as usual with your normal  shampoo.  3.  After you shampoo, rinse your hair and body thoroughly to remove the shampoo.                             4.  Use CHG as you would any other liquid soap.  You can apply chg directly to the skin and wash.  Gently with a scrungie or clean washcloth.  5.  Apply the CHG Soap to your body ONLY FROM THE NECK DOWN.   Do not use on face/ open                           Wound or open sores. Avoid contact with eyes, ears mouth and genitals (private parts).                       Wash face,  Genitals (private parts) with your normal soap.             6.  Wash thoroughly, paying special attention to the area where your  surgery  will be performed.  7.  Thoroughly rinse your body with warm water  from the neck down.  8.  DO NOT shower/wash with your normal soap after using and rinsing off the CHG Soap.            9.  Pat yourself dry with a clean towel.            10.  Wear clean pajamas.            11.  Place clean sheets on your bed the night of your first shower and do not  sleep with pets.  ON THE DAY OF SURGERY : Do not apply any lotions/deodorants the morning of surgery.  Please wear clean clothes to the hospital/surgery center.     FAILURE TO FOLLOW THESE INSTRUCTIONS MAY RESULT IN THE CANCELLATION OF YOUR  SURGERY  PATIENT SIGNATURE_________________________________  NURSE SIGNATURE__________________________________  ________________________________________________________________________

## 2023-12-31 ENCOUNTER — Encounter (HOSPITAL_COMMUNITY)
Admission: RE | Admit: 2023-12-31 | Discharge: 2023-12-31 | Disposition: A | Discharge status: Home / Self Care | Source: Ambulatory Visit | Attending: Urology | Admitting: Urology

## 2023-12-31 ENCOUNTER — Other Ambulatory Visit: Payer: Self-pay

## 2023-12-31 ENCOUNTER — Encounter (HOSPITAL_COMMUNITY): Payer: Self-pay

## 2023-12-31 DIAGNOSIS — I10 Essential (primary) hypertension: Secondary | ICD-10-CM | POA: Diagnosis not present

## 2023-12-31 DIAGNOSIS — I2510 Atherosclerotic heart disease of native coronary artery without angina pectoris: Secondary | ICD-10-CM | POA: Diagnosis not present

## 2023-12-31 DIAGNOSIS — E063 Autoimmune thyroiditis: Secondary | ICD-10-CM | POA: Diagnosis not present

## 2023-12-31 DIAGNOSIS — C678 Malignant neoplasm of overlapping sites of bladder: Secondary | ICD-10-CM | POA: Diagnosis not present

## 2023-12-31 DIAGNOSIS — J449 Chronic obstructive pulmonary disease, unspecified: Secondary | ICD-10-CM | POA: Insufficient documentation

## 2023-12-31 DIAGNOSIS — Z01812 Encounter for preprocedural laboratory examination: Secondary | ICD-10-CM | POA: Diagnosis present

## 2023-12-31 DIAGNOSIS — Z87891 Personal history of nicotine dependence: Secondary | ICD-10-CM | POA: Insufficient documentation

## 2023-12-31 HISTORY — DX: Pneumonia, unspecified organism: J18.9

## 2023-12-31 LAB — CBC
HCT: 39.4 % (ref 39.0–52.0)
Hemoglobin: 12.3 g/dL — ABNORMAL LOW (ref 13.0–17.0)
MCH: 31.4 pg (ref 26.0–34.0)
MCHC: 31.2 g/dL (ref 30.0–36.0)
MCV: 100.5 fL — ABNORMAL HIGH (ref 80.0–100.0)
Platelets: 272 K/uL (ref 150–400)
RBC: 3.92 MIL/uL — ABNORMAL LOW (ref 4.22–5.81)
RDW: 14 % (ref 11.5–15.5)
WBC: 7.8 K/uL (ref 4.0–10.5)
nRBC: 0 % (ref 0.0–0.2)

## 2023-12-31 LAB — BASIC METABOLIC PANEL WITH GFR
Anion gap: 8 (ref 5–15)
BUN: 20 mg/dL (ref 8–23)
CO2: 29 mmol/L (ref 22–32)
Calcium: 9.6 mg/dL (ref 8.9–10.3)
Chloride: 102 mmol/L (ref 98–111)
Creatinine, Ser: 1.35 mg/dL — ABNORMAL HIGH (ref 0.61–1.24)
GFR, Estimated: 57 mL/min — ABNORMAL LOW (ref >60)
Glucose, Bld: 108 mg/dL — ABNORMAL HIGH (ref 70–99)
Potassium: 4.6 mmol/L (ref 3.5–5.1)
Sodium: 139 mmol/L (ref 135–145)

## 2023-12-31 NOTE — Progress Notes (Addendum)
 For Anesthesia: PCP - Corlis Pagan, NP  Cardiologist - N/A Pulmonology : Kara Dorn NOVAK, MD LOV: 12/12/23 Bowel Prep reminder:  Chest x-ray - 11/22/23 CT angio chest: 11/13/19 EKG - 11/22/23 Stress Test -  ECHO - 11/22/23 Cardiac Cath -  Pacemaker/ICD device last checked: Pacemaker orders received: Device Rep notified:  Spinal Cord Stimulator:N/A  Sleep Study - N/A CPAP -   Fasting Blood Sugar - N/A Checks Blood Sugar _____ times a day Date and result of last Hgb A1c-  Last dose of GLP1 agonist- N/A GLP1 instructions:   Last dose of SGLT-2 inhibitors- N/A SGLT-2 instructions:   Blood Thinner Instructions:N/A Aspirin Instructions: Last Dose:  Activity level:    Unable to go up a flight of stairs without shortness of breath    Anesthesia review: Hx: CAD, murmur, COPD, mild aortic stenosis, HTN.,smoker. Was admitted at the hospital from 11/22/23 to 11/28/23 with COPD exacerbation,pt. Was intubated and require mechanical ventilation.He is using continuous oxygen now,at 2L.  Patient denies shortness of breath, fever, cough and chest pain at PAT appointment   Patient verbalized understanding of instructions that were reviewed over the telephone.

## 2024-01-01 ENCOUNTER — Telehealth (HOSPITAL_BASED_OUTPATIENT_CLINIC_OR_DEPARTMENT_OTHER): Payer: Self-pay

## 2024-01-01 NOTE — Telephone Encounter (Signed)
 Copied from CRM #[id number]. Topic: Clinical - Medical Advice >> Jan 01, 2024  3:39 PM Corean SAUNDERS wrote: Reason for CRM: Harlene PA from [facility] is seeking confirmation from Dr. Kara as the patient has surgery tomorrow and Harlene is requesting to know what Dr. Kara stand point is on the patient being under anesthesia. Please call Harlene back at [phone] >> Jan 01, 2024  4:18 PM Rozanna MATSU wrote: REBECCA WITH [facility] CALLING BACK ABOUT PT SURGERY CLEARANCE CALLED CAL TO WARM TRANSFER BUT AFTER SPEAKING WITH REP NO ONE FROM THE CLEARANCE TEAM ANSWERED. ADVISED REBECCA WITH [facility] STATED THEY FOUND OUT AT THE LAST MINUTE THE PT NEED THE SURGERY. THEY WILL STILL NEED THE CLEARANCE DONE FOR PT. THANKS    Routing to [place] for surgical clearance and Dr. Kara

## 2024-01-01 NOTE — Anesthesia Preprocedure Evaluation (Signed)
 Anesthesia Evaluation    Airway        Dental   Pulmonary Current Smoker and Patient abstained from smoking.          Cardiovascular hypertension,      Neuro/Psych    GI/Hepatic   Endo/Other    Renal/GU      Musculoskeletal   Abdominal   Peds  Hematology   Anesthesia Other Findings   Reproductive/Obstetrics                              Anesthesia Physical Anesthesia Plan  ASA:   Anesthesia Plan:    Post-op Pain Management:    Induction:   PONV Risk Score and Plan:   Airway Management Planned:   Additional Equipment:   Intra-op Plan:   Post-operative Plan:   Informed Consent:   Plan Discussed with:   Anesthesia Plan Comments: (See PAT note 12/31/2023)        Anesthesia Quick Evaluation

## 2024-01-01 NOTE — Progress Notes (Signed)
 Anesthesia Chart Review   Case: [id number] Date/Time: 01/02/24 1145   Procedures:      TURBT (TRANSURETHRAL RESECTION OF BLADDER TUMOR) (Right)     CYSTOSCOPY, WITH RETROGRADE PYELOGRAM (Right)   Anesthesia type: General   Diagnosis: Malignant neoplasm of overlapping sites of bladder (HCC) [C67.8]   Pre-op diagnosis: RECURRENT BLADDER CANCER   Location[facility] ROOM 03 / [facility]   Surgeons: Alvaro Ricardo KATHEE Mickey., MD       DISCUSSION:68 y.o. smoker with h/o hypothyroidism, COPD, HTN, CAD, AS, carotid artery stenosis, recurrent bladder cancer scheduled for above procedure 01/02/2024 with Dr. Ricardo Alvaro.   Pt previously scheduled for surgery in July.  Presented to [facility] with cough and shortness of breath, referred to ED.  He was admitted 7/10-7/16/2025 for COPD exacerbation and pneumonia, required intubation. Discharged on supplemental oxygen.   Post hospitalization follow up with pulmonology 12/12/2023. Pt has quit smoking following hospitalization. Per notes did not desaturate on simple walk. Requested pulmonology clearance, pending. I have sent staff message and left a voicemail with Dr. Kara.  Discussed with Dr. Maryclare, will evaluate DOS.    Pt with h/o mild AS, seen by cardio in the past.  Last visit in 2021 at that time three month follow up recommended.  A repeat echo was done during recent hospitalization, AS now mild to moderate with mean gradient 23.0, aortic valve area 1.10 cm2.  Discussed with Dr. Maryclare, cardio follow up recommended, but not needed prior to surgery.  VS: BP 123/76   Pulse 75   Temp 36.8 C (Oral)   Ht 5' 10 (1.778 m)   Wt 56.2 kg   SpO2 99%   BMI 17.79 kg/m   PROVIDERS: Corlis Pagan, NP is PCP   Pulmonology : Kara Dorn KATHEE, MD  LABS: Labs reviewed: Acceptable for surgery. (all labs ordered are listed, but only abnormal results are displayed)  Labs Reviewed  BASIC METABOLIC PANEL WITH GFR - Abnormal; Notable for the following components:       Result Value   Glucose, Bld 108 (*)    Creatinine, Ser 1.35 (*)    GFR, Estimated 57 (*)    All other components within normal limits  CBC - Abnormal; Notable for the following components:   RBC 3.92 (*)    Hemoglobin 12.3 (*)    MCV 100.5 (*)    All other components within normal limits     IMAGES: Carotid artery duplex  02/06/19  Doppler flow velocities in the bilateral proximal internal carotid artery  are consistent with stenosis in the range of 16-49% with moderate  heterogeneous plaque.  Stenosis in the left common carotid artery (<50%). Left external carotid  <50% stenosis.  Antegrade right vertebral artery flow. Antegrade left vertebral artery  flow.  Follow up in one year is appropriate if clinically indicated.   EKG:   CV: Echo 11/22/2023 1. Left ventricular ejection fraction, by estimation, is 65 to 70%. The  left ventricle has normal function. The left ventricle has no regional  wall motion abnormalities. Left ventricular diastolic parameters are  consistent with Grade I diastolic  dysfunction (impaired relaxation).   2. Right ventricular systolic function is normal. The right ventricular  size is normal. Tricuspid regurgitation signal is inadequate for assessing  PA pressure.   3. The mitral valve is normal in structure. Trivial mitral valve  regurgitation.   4. The aortic valve is tricuspid. There is moderate calcification of the  aortic valve. Aortic valve regurgitation is  mild. Mild to moderate aortic  valve stenosis. Aortic valve area, by VTI measures 1.10 cm. Aortic valve  mean gradient measures 23.0  mmHg. Aortic valve Vmax measures 2.98 m/s.   5. The inferior vena cava is dilated in size with <50% respiratory  variability, suggesting right atrial pressure of 15 mmHg.   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 Dorn Lesches- 04/01/15- Heterogenous plaque- 1-39% RICA stenosis.  Homogenous plaque- 40-59% LICA stenosis.  >50% LECA stenosis.  Normal subclavian arteries bilaterally.  Patent vertebral arteries with antegrade flow.   Complication of anesthesia    slow to wake up   Constipation 08/07/2015   COPD (chronic obstructive pulmonary disease) (HCC)    Coronary artery disease involving native coronary artery of native heart without angina pectoris 04/03/2019   Displacement of lumbar intervertebral disc without myelopathy 04/14/2013   Dyspnea    Essential (primary) hypertension 04/03/2019   Golfer's elbow, right 03/30/2016   Heart murmur    History of kidney removal    left kidney . due to cancer   History of kidney stones    Hyperlipidemia 08/07/2015   Hypothyroidism due to Hashimoto's thyroiditis 07/15/2015   Leg weakness 08/07/2015   Lumbar radiculopathy 10/21/2013   Nocturia    Nonrheumatic aortic valve insufficiency 02/22/2019   Nonrheumatic aortic valve stenosis 02/22/2019   Pneumonia    Spinal stenosis of lumbar region 05/19/2015   Transitional cell carcinoma of bladder (HCC) UROLOGIST--  DR CEIL   S/P TURBT X3--  HX BCG TX'S   Vitamin D deficiency     Past Surgical History:  Procedure Laterality Date   BRONCHIAL BIOPSY  01/09/2022   Procedure: BRONCHIAL BIOPSIES;  Surgeon: Gladis Leonor HERO, MD;  Location: [facility] END[facility]COPY;  Service: Pulmonary;;   BRONCHIAL BRUSHINGS  01/09/2022   Procedure: BRONCHIAL BRUSHINGS;  Surgeon: Gladis Leonor HERO, MD;  Location: [facility] ENDOSCOPY;  Service: Pulmonary;;   BRONCHIAL NEEDLE ASPIRATION BIOPSY  01/09/2022   Procedure: BRONCHIAL NEEDLE ASPIRATION BIOPSIES;  Surgeon: Gladis Leonor HERO, MD;  Location: [facility] ENDOSCOPY;  Service: Pulmonary;;   CYSTOSCOPY  05/10/2022   Procedure: CYSTOSCOPY WITH STENT REMOVAL;  Surgeon: Alvaro Hummer, MD;  [facility];  Service: Urology;;   PHYLLIS W/ RETROGRADES Bilateral  12/13/2012   Procedure: CYSTOSCOPY WITH RETROGRADE PYELOGRAM;  Surgeon: Mark C Ottelin, MD;  Location: [facility];  Service: Urology;  Laterality: Bilateral;   CYSTOSCOPY W/ RETROGRADES Right 04/27/2023   Procedure: CYSTOSCOPY WITH RIGHT RETROGRADE PYELOGRAM;  Surgeon: Alvaro Hummer KATHEE Mickey., MD;  [facility];  Service: Urology;  Laterality: Right;  60 MINUTES NEEDED FOR CASE   CYSTOSCOPY WITH LITHOLAPAXY N/A 04/04/2013   Procedure: CYSTOSCOPY ;  Surgeon: Oneil JAYSON CEIL, MD;  Location[facility];  Service: Urology;  Laterality: N/A;   CYSTOSCOPY WITH RETROGRADE PYELOGRAM, URETEROSCOPY AND STENT PLACEMENT Bilateral 02/28/2022   Procedure: CYSTOSCOPY WITH RETROGRADE PYELOGRAM, URETEROSCOPY, LEFT STENT PLACEMENT;  Surgeon: Rosalind Zachary KATHEE, MD;  [facility];  Service: Urology;  Laterality: Bilateral;  1 HR   LUMBAR DISC SURGERY  09/20/2005   L2 -- L5   LUMBAR LAMINECTOMY/DECOMPRESSION MICRODISCECTOMY Left 01/09/2013   Procedure: Left  lumbar four-five microdiskectomy;  Surgeon: Rockey LITTIE Peru, MD;  Location: [facility];  Service: Neurosurgery;  Laterality:  Left;   LUMBAR WOUND DEBRIDEMENT N/A 01/13/2013   Procedure: LUMBAR WOUND DEBRIDEMENT;  Surgeon: Victory DELENA Gunnels, MD;  Location: [facility];  Service: Neurosurgery;  Laterality: N/A;  Lumbar Wound Exploration,  Attempted Placement of Cerebrospinal Drain, and Repair of CSF Leak   ROBOT ASSITED LAPAROSCOPIC NEPHROURETERECTOMY Left 05/10/2022   Procedure: XI ROBOT ASSITED LAPAROSCOPIC NEPHROURETERECTOMY;  Surgeon: Alvaro Hummer, MD;  [facility];  Service: Urology;  Laterality: Left;   TOOTH EXTRACTION     TRANSURETHRAL RESECTION OF BLADDER TUMOR  07-05-2009;   11-22-2009   bladder cancer   TRANSURETHRAL RESECTION OF BLADDER TUMOR N/A 12/13/2012   Procedure: TRANSURETHRAL RESECTION OF BLADDER TUMOR (TURBT) AND INSTILLATION OF MYTOMYCIN;  Surgeon: Mark C Ottelin, MD;  Location: [facility];  Service: Urology;  Laterality: N/A;   TRANSURETHRAL RESECTION OF BLADDER TUMOR N/A 04/27/2023   Procedure: TRANSURETHRAL RESECTION OF BLADDER TUMOR (TURBT);  Surgeon: Alvaro Hummer KATHEE Mickey., MD;  [facility];  Service: Urology;  Laterality: N/A;   TRANSURETHRAL RESECTION OF BLADDER TUMOR WITH GYRUS (TURBT-GYRUS) N/A 04/04/2013   Procedure: TRANSURETHRAL RESECTION OF BLADDER TUMOR WITH GYRUS (TURBT-GYRUS);  Surgeon: Mark C Ottelin, MD;  Location: [facility];  Service: Urology;  Laterality: N/A;    MEDICATIONS:  albuterol  (VENTOLIN  HFA) 108 (90 Base) MCG/ACT inhaler   amLODipine  (NORVASC ) 2.5 MG tablet   BREO ELLIPTA  200-25 MCG/ACT AEPB   Calcium-Magnesium -Zinc (CAL-MAG-ZINC PO)   Cholecalciferol (VITAMIN D3 PO)   Coenzyme Q10 (COQ10 PO)   Dextromethorphan-guaiFENesin  (MUCINEX  FAST-MAX DM MAX) 5-100 MG/5ML LIQD   ezetimibe (ZETIA) 10 MG tablet   feeding supplement (ENSURE PLUS HIGH PROTEIN) LIQD   guaiFENesin  (ROBITUSSIN) 100 MG/5ML liquid   Homeopathic Products (RESTFUL LEGS SL)   HYDROcodone -acetaminophen  (NORCO) 10-325 MG tablet   ipratropium-albuterol  (DUONEB) 0.5-2.5 (3) MG/3ML SOLN   levothyroxine  (SYNTHROID ) 100 MCG tablet   Omega-3 Fatty Acids (FISH OIL PO)   rOPINIRole  (REQUIP ) 1 MG tablet   simvastatin (ZOCOR) 10 MG tablet   No current facility-administered medications for this encounter.     Harlene Hoots Ward, PA-C [facility] [phone]

## 2024-01-01 NOTE — Telephone Encounter (Unsigned)
 Copied from CRM #[id number]. Topic: General - Other >> Jan 01, 2024 12:54 PM Rilla B wrote: Reason for CRM:  Asberry from [facility]. Patient scheduled for surgery tomorrow and want to know status of clearance from Dr Kara.  Please call [phone] ext [phone]

## 2024-01-02 ENCOUNTER — Encounter (HOSPITAL_COMMUNITY): Admission: RE | Payer: Self-pay | Source: Home / Self Care

## 2024-01-02 ENCOUNTER — Encounter (HOSPITAL_COMMUNITY): Payer: Self-pay | Admitting: Medical

## 2024-01-02 ENCOUNTER — Ambulatory Visit (HOSPITAL_COMMUNITY): Admission: RE | Admit: 2024-01-02 | Source: Home / Self Care | Admitting: Urology

## 2024-01-02 ENCOUNTER — Encounter (HOSPITAL_COMMUNITY): Payer: Self-pay | Admitting: Anesthesiology

## 2024-01-02 SURGERY — TURBT (TRANSURETHRAL RESECTION OF BLADDER TUMOR)
Anesthesia: General | Laterality: Right

## 2024-01-02 NOTE — Telephone Encounter (Signed)
 I never received a formal clearance request on this pt  Dr Kara, do you want to update your last note to include risk assessment- pt last seen 12/13/23

## 2024-01-02 NOTE — Telephone Encounter (Signed)
 I called [facility] and spoke with Asberry- explained to her that Dr Kara needs to speak with Dr. Alvaro about the surgery for this pt. He had reached out and provided his contact information on the date of pt's last ov 12/13/23 and never heard anything back. I provided Asberry Dr. Luann mobile number and the number to the office here and she will pass this along to Dr. Alvaro and ask him to call. Routing back to Dr. Kara as RICK and so that he can document conversation with Dr. Alvaro when he calls back. Thanks.

## 2024-01-03 NOTE — Telephone Encounter (Signed)
 Spoke with Dr. Alvaro. He has a bladder scrape procedure planned and not a cystectomy which patient refused years ago.   I let Dr. Alvaro know there is no concern from a respiratory standpoint to move forward with anesthesia for his procedure.  ARISCAT Score for Postoperative Pulmonary Complications Intermediate risk 13.3% risk of in-hospital post-op pulmonary complications (composite including respiratory failure, respiratory infection, pleural effusion, atelectasis, pneumothorax, bronchospasm, aspiration pneumonitis)  We will monitor his lung nodules for now.  Dorn Chill, MD [facility] Office: [phone]   See Amion for personal pager PCCM on call pager [phone] until 7pm. Please call Elink 7p-7a. [phone]

## 2024-01-15 ENCOUNTER — Ambulatory Visit (HOSPITAL_COMMUNITY)

## 2024-01-23 ENCOUNTER — Ambulatory Visit (HOSPITAL_COMMUNITY)
Admission: RE | Admit: 2024-01-23 | Discharge: 2024-01-23 | Disposition: A | Discharge status: Home / Self Care | Source: Ambulatory Visit | Attending: Pulmonary Disease | Admitting: Pulmonary Disease

## 2024-01-23 DIAGNOSIS — R911 Solitary pulmonary nodule: Secondary | ICD-10-CM | POA: Diagnosis present

## 2024-02-02 ENCOUNTER — Ambulatory Visit: Payer: Self-pay | Admitting: Pulmonary Disease

## 2024-02-04 NOTE — Progress Notes (Signed)
 X 1  VM/LM okay per DPR -   Any other question to reach out to us .  -NFN

## 2024-02-06 ENCOUNTER — Ambulatory Visit: Admitting: Pulmonary Disease

## 2024-02-06 ENCOUNTER — Encounter: Payer: Self-pay | Admitting: Pulmonary Disease

## 2024-02-06 VITALS — BP 133/75 | HR 93 | Ht 70.0 in | Wt 120.8 lb

## 2024-02-06 DIAGNOSIS — J9611 Chronic respiratory failure with hypoxia: Secondary | ICD-10-CM

## 2024-02-06 DIAGNOSIS — C679 Malignant neoplasm of bladder, unspecified: Secondary | ICD-10-CM

## 2024-02-06 DIAGNOSIS — J432 Centrilobular emphysema: Secondary | ICD-10-CM | POA: Diagnosis not present

## 2024-02-06 DIAGNOSIS — F17211 Nicotine dependence, cigarettes, in remission: Secondary | ICD-10-CM

## 2024-02-06 DIAGNOSIS — R918 Other nonspecific abnormal finding of lung field: Secondary | ICD-10-CM

## 2024-02-06 DIAGNOSIS — J449 Chronic obstructive pulmonary disease, unspecified: Secondary | ICD-10-CM

## 2024-02-06 MED ORDER — TRELEGY ELLIPTA 100-62.5-25 MCG/ACT IN AEPB: INHALATION_SPRAY | RESPIRATORY_TRACT | Status: DC

## 2024-02-06 MED ORDER — TRELEGY ELLIPTA 200-62.5-25 MCG/ACT IN AEPB: 1.0000 | INHALATION_SPRAY | Freq: Every day | RESPIRATORY_TRACT | 11 refills | Status: AC

## 2024-02-06 NOTE — Patient Instructions (Addendum)
 Long Acting Nebulizer Treatments: Yupelri  Brovana  Budesonide   Start trelegy ellipta  1 puff daily - rinse mouth out after each use  Stop using breo inhaler  Continue to use duoneb nebulizer treatments as needed  We will discuss your case at our meeting on 10/1 and I will let you know if we recommend moving forward with bronchoscopy  Follow up in 3 months

## 2024-02-06 NOTE — Progress Notes (Signed)
 Subjective:   PATIENT ID: Harry Young GENDER: male DOB: May 30, 1955, MRN: [id number]   HPI Discussed the use of AI scribe software for clinical note transcription with the patient, who gave verbal consent to proceed.  Harry Young is a 68 year old male, recent former smoker with history of bladder cancer, COPD/Emphysema, lung nodules, and CAD who returns to pulmonary clinic for COPD, chronic hypoxemic respiratory failure and pulmonary nodules.  OV 12/12/23 He was admitted 7/10 to 7/16 for acute hypoxemic respiratory failure due to COPD exacerbation. He did require intubation and mechanical ventilation. He was discharged on supplemental oxygen which is new for him. On simple walk today he did not desaturate. CTA Chest showed multiple new bilateral pulmonary nodules.  He quit smoking following his recent hospitalization. He is concerned about the portability of his oxygen setup.  He experiences minimal wheezing and a slight cough with little mucus production. He uses Breo, one puff daily, and has Breztri  from the hospital but notes no significant difference between the two inhalers. Insurance coverage issues limit his access to certain treatments.  He does not use CPAP or BiPAP at night but wears oxygen while sleeping. He has noticed new swelling in both feet, although his left foot has previously experienced mild swelling.   OV 02/06/24 He experiences significant dyspnea, especially during showers, feeling unable to breathe when water  runs down on him, despite using supplemental oxygen. Walking across the house requires frequent pauses to catch his breath. His oxygen saturation drops to 83% when walking to the end of his driveway and back, even with a portable oxygen.  Repeat CT Chest scan 01/23/24 compared to 11/22/23 shows stable pulmonary nodules bilaterally.   He has not had his urologic procedure due to concerns from the anesthesia team.   He is currently on Breo for his respiratory  issues and has previously used Breztri , which he found beneficial. He is interested in exploring other medications to improve his breathing, as he experiences distress and dyspnea with minimal exertion.  His wife notes weight loss and increased sedentary behavior due to his breathing difficulties, impacting his physical activity levels.  Past Medical History:  Diagnosis Date   Anxiety disorder, unspecified    Arthritis    Asthma    as a child   Asymmetrical hearing loss of left ear 01/03/2018   Bilateral carotid artery stenosis 08/07/2015   Dr. Dorn Lesches- 04/01/15- Heterogenous plaque- 1-39% RICA stenosis.  Homogenous plaque- 40-59% LICA stenosis.  >50% LECA stenosis.  Normal subclavian arteries bilaterally.  Patent vertebral arteries with antegrade flow.   Complication of anesthesia    slow to wake up   Constipation 08/07/2015   COPD (chronic obstructive pulmonary disease) (HCC)    Coronary artery disease involving native coronary artery of native heart without angina pectoris 04/03/2019   Displacement of lumbar intervertebral disc without myelopathy 04/14/2013   Dyspnea    Essential (primary) hypertension 04/03/2019   Golfer's elbow, right 03/30/2016   Heart murmur    History of kidney removal    left kidney . due to cancer   History of kidney stones    Hyperlipidemia 08/07/2015   Hypothyroidism due to Hashimoto's thyroiditis 07/15/2015   Leg weakness 08/07/2015   Lumbar radiculopathy 10/21/2013   Nocturia    Nonrheumatic aortic valve insufficiency 02/22/2019   Nonrheumatic aortic valve stenosis 02/22/2019   Pneumonia    Spinal stenosis of lumbar region 05/19/2015   Transitional cell carcinoma of bladder (HCC) UROLOGIST--  DR  OTTELIN   S/P TURBT X3--  HX BCG TX'S   Vitamin D deficiency      Family History  Problem Relation Age of Onset   Hyperlipidemia Mother    Heart disease Father    Heart failure Father    COPD Father    Diabetes Brother      Social History    Socioeconomic History   Marital status: Married    Spouse name: Not on file   Number of children: 0   Years of education: Not on file   Highest education level: Not on file  Occupational History   Not on file  Tobacco Use   Smoking status: Some Days    Current packs/day: 1.00    Average packs/day: 1 pack/day for 55.6 years (55.6 ttl pk-yrs)    Types: Cigarettes    Start date: 07/04/1968   Smokeless tobacco: Never  Vaping Use   Vaping status: Never Used  Substance and Sexual Activity   Alcohol use: No   Drug use: No   Sexual activity: Not Currently  Other Topics Concern   Not on file  Social History Narrative   Not on file   Social Drivers of Health   Financial Resource Strain: Not on file  Food Insecurity: No Food Insecurity (11/30/2023)   Hunger Vital Sign    Worried About Running Out of Food in the Last Year: Never true    Ran Out of Food in the Last Year: Never true  Transportation Needs: No Transportation Needs (11/30/2023)   PRAPARE - Administrator, Civil Service (Medical): No    Lack of Transportation (Non-Medical): No  Physical Activity: Not on file  Stress: Not on file  Social Connections: Patient Unable To Answer (11/23/2023)   Social Connection and Isolation Panel    Frequency of Communication with Friends and Family: Patient unable to answer    Frequency of Social Gatherings with Friends and Family: Patient unable to answer    Attends Religious Services: Patient unable to answer    Active Member of Clubs or Organizations: Patient unable to answer    Attends Banker Meetings: Patient unable to answer    Marital Status: Patient unable to answer  Intimate Partner Violence: Not At Risk (11/30/2023)   Humiliation, Afraid, Rape, and Kick questionnaire    Fear of Current or Ex-Partner: No    Emotionally Abused: No    Physically Abused: No    Sexually Abused: No     Allergies  Allergen Reactions   Crestor [Rosuvastatin] Other (See  Comments)    Urinary retention.   Sulfa Antibiotics Rash     Outpatient Medications Prior to Visit  Medication Sig Dispense Refill   albuterol  (VENTOLIN  HFA) 108 (90 Base) MCG/ACT inhaler Inhale 1-2 puffs into the lungs every 6 (six) hours as needed for shortness of breath or wheezing.     amLODipine  (NORVASC ) 2.5 MG tablet Take 2.5 mg by mouth in the morning.     Calcium-Magnesium -Zinc (CAL-MAG-ZINC PO) Take 1 tablet by mouth daily with supper.     Cholecalciferol (VITAMIN D3 PO) Take 5,000 Units by mouth daily with supper.     Coenzyme Q10 (COQ10 PO) Take 1 capsule by mouth in the morning.     Dextromethorphan-guaiFENesin  (MUCINEX  FAST-MAX DM MAX) 5-100 MG/5ML LIQD Take 15 mLs by mouth in the morning and at bedtime.     ezetimibe (ZETIA) 10 MG tablet Take 10 mg by mouth daily at 12 noon.  feeding supplement (ENSURE PLUS HIGH PROTEIN) LIQD Take 237 mLs by mouth 2 (two) times daily between meals. (Patient taking differently: Take 237 mLs by mouth 2 (two) times daily as needed (nutritonal support).) 14220 mL 2   guaiFENesin  (ROBITUSSIN) 100 MG/5ML liquid Take 10 mLs by mouth 3 (three) times daily. (Patient taking differently: Take 10 mLs by mouth 3 (three) times daily as needed for cough.) 120 mL 0   Homeopathic Products (RESTFUL LEGS SL) Place 2 tablets under the tongue at bedtime.     HYDROcodone -acetaminophen  (NORCO) 10-325 MG tablet Take 1 tablet by mouth 2 (two) times daily as needed (pain.).     ipratropium-albuterol  (DUONEB) 0.5-2.5 (3) MG/3ML SOLN Take 3 mLs by nebulization every 4 (four) hours as needed. 360 mL 2   levothyroxine  (SYNTHROID ) 100 MCG tablet Take 100 mcg by mouth daily before breakfast.     Omega-3 Fatty Acids (FISH OIL PO) Take 1 capsule by mouth in the morning.     rOPINIRole  (REQUIP ) 1 MG tablet Take 1 tablet (1 mg total) by mouth 3 (three) times daily. 90 tablet 1   simvastatin (ZOCOR) 10 MG tablet Take 10 mg by mouth 2 (two) times a week. Sundays & Thursdays      BREO ELLIPTA  200-25 MCG/ACT AEPB Inhale 1 puff into the lungs daily.     No facility-administered medications prior to visit.    Review of Systems  Constitutional:  Positive for weight loss. Negative for chills, fever and malaise/fatigue.  HENT:  Negative for congestion, sinus pain and sore throat.   Eyes: Negative.   Respiratory:  Positive for cough, shortness of breath and wheezing. Negative for hemoptysis and sputum production.   Cardiovascular:  Negative for chest pain, palpitations, orthopnea, claudication and leg swelling.  Gastrointestinal:  Negative for abdominal pain, heartburn, nausea and vomiting.  Genitourinary: Negative.   Musculoskeletal:  Negative for joint pain and myalgias.  Skin:  Negative for rash.  Neurological:  Negative for weakness.  Endo/Heme/Allergies: Negative.   Psychiatric/Behavioral: Negative.     Objective:   Vitals:   02/06/24 1450  BP: 133/75  Pulse: 93  SpO2: 98%  Weight: 120 lb 12.8 oz (54.8 kg)  Height: 5' 10 (1.778 m)     Physical Exam Constitutional:      General: He is not in acute distress.    Appearance: Normal appearance. He is ill-appearing (chronically).  Eyes:     General: No scleral icterus.    Conjunctiva/sclera: Conjunctivae normal.  Cardiovascular:     Rate and Rhythm: Normal rate and regular rhythm.  Pulmonary:     Breath sounds: No wheezing, rhonchi or rales.  Musculoskeletal:     Right lower leg: No edema.     Left lower leg: No edema.  Skin:    General: Skin is warm and dry.  Neurological:     General: No focal deficit present.    CBC    Component Value Date/Time   WBC 7.8 12/31/2023 0830   RBC 3.92 (L) 12/31/2023 0830   HGB 12.3 (L) 12/31/2023 0830   HCT 39.4 12/31/2023 0830   PLT 272 12/31/2023 0830   MCV 100.5 (H) 12/31/2023 0830   MCH 31.4 12/31/2023 0830   MCHC 31.2 12/31/2023 0830   RDW 14.0 12/31/2023 0830   LYMPHSABS 2.3 01/12/2013 2115   MONOABS 1.1 (H) 01/12/2013 2115   EOSABS 0.0  01/12/2013 2115   BASOSABS 0.0 01/12/2013 2115      Latest Ref Rng & Units 12/31/2023  8:30 AM 11/28/2023   12:30 PM 11/27/2023    3:08 AM  BMP  Glucose 70 - 99 mg/dL 891  875  99   BUN 8 - 23 mg/dL 20  38  49   Creatinine 0.61 - 1.24 mg/dL 8.64  8.91  8.82   Sodium 135 - 145 mmol/L 139  139  140   Potassium 3.5 - 5.1 mmol/L 4.6  4.0  4.4   Chloride 98 - 111 mmol/L 102  102  104   CO2 22 - 32 mmol/L 29  29  29    Calcium 8.9 - 10.3 mg/dL 9.6  7.8  8.6    Chest imaging: CT Chest 01/23/24 Cardiovascular: The heart size is normal. Atherosclerotic calcifications of aorta and coronary arteries. No pericardial fluid.   Mediastinum/Nodes: Few subcentimeter mediastinal lymph nodes.   Lungs/Pleura: Multiple scattered pulmonary and perifissural nodules are identified measuring up to 7 mm, stable to prior and most consistent with metastasis in this patient with known malignancy. Fiducial marker in left upper lobe adjacent to a 5 mm nodule, previously measured 6 mm. (6/40)No new nodules identified. Mild bronchial wall thickening. Upper lobe predominant mild centrilobular emphysematous changes. No pleural effusion.  PFT:     No data to display          Labs:  Path:  Echo:  Heart Catheterization:    Assessment & Plan:   Centrilobular emphysema (HCC) - Plan: Fluticasone -Umeclidin-Vilant (TRELEGY ELLIPTA ) 200-62.5-25 MCG/ACT AEPB, Ambulatory Referral for DME  Chronic hypoxemic respiratory failure (HCC) - Plan: Ambulatory Referral for DME  Pulmonary nodules  Transitional cell carcinoma of bladder (HCC) Assessment and Plan Harry Young is a 68 year old male, recent former smoker with history of bladder cancer, COPD/Emphysema, lung nodules, and CAD who returns to pulmonary clinic for COPD, chronic hypoxemic respiratory failure and pulmonary nodules.  Chronic obstructive pulmonary disease (COPD) with home oxygen therapy - Start trelegy ellipta  1 puff daily  Chronic Hypoxemic  Respiratory Failure - continue supplemental oxygen, gaol SpO2 88% or higher - Send order for POC, 3-4L pulsed with ambulation as noted from walk test  Multiple new lung nodules, possible metastatic disease Remain stable since July - Will discuss CT scan results with navigational bronchoscopy team and determine if procedure is warranted  Bladder cancer - Bladder cancer with surgery scheduled for August 20th with urology - will discuss case with urology team   Follow up in 3 months  Dorn Chill, MD [facility] Office: [phone]   Current Outpatient Medications:    albuterol  (VENTOLIN  HFA) 108 (90 Base) MCG/ACT inhaler, Inhale 1-2 puffs into the lungs every 6 (six) hours as needed for shortness of breath or wheezing., Disp: , Rfl:    amLODipine  (NORVASC ) 2.5 MG tablet, Take 2.5 mg by mouth in the morning., Disp: , Rfl:    Calcium-Magnesium -Zinc (CAL-MAG-ZINC PO), Take 1 tablet by mouth daily with supper., Disp: , Rfl:    Cholecalciferol (VITAMIN D3 PO), Take 5,000 Units by mouth daily with supper., Disp: , Rfl:    Coenzyme Q10 (COQ10 PO), Take 1 capsule by mouth in the morning., Disp: , Rfl:    Dextromethorphan-guaiFENesin  (MUCINEX  FAST-MAX DM MAX) 5-100 MG/5ML LIQD, Take 15 mLs by mouth in the morning and at bedtime., Disp: , Rfl:    ezetimibe (ZETIA) 10 MG tablet, Take 10 mg by mouth daily at 12 noon., Disp: , Rfl:    feeding supplement (ENSURE PLUS HIGH PROTEIN) LIQD, Take 237 mLs by mouth 2 (two)  times daily between meals. (Patient taking differently: Take 237 mLs by mouth 2 (two) times daily as needed (nutritonal support).), Disp: 14220 mL, Rfl: 2   Fluticasone -Umeclidin-Vilant (TRELEGY ELLIPTA ) 100-62.5-25 MCG/ACT AEPB, 2 samples, Disp: , Rfl:    Fluticasone -Umeclidin-Vilant (TRELEGY ELLIPTA ) 200-62.5-25 MCG/ACT AEPB, Inhale 1 puff into the lungs daily., Disp: 28 each, Rfl: 11   guaiFENesin  (ROBITUSSIN) 100 MG/5ML liquid, Take 10 mLs by mouth 3 (three)  times daily. (Patient taking differently: Take 10 mLs by mouth 3 (three) times daily as needed for cough.), Disp: 120 mL, Rfl: 0   Homeopathic Products (RESTFUL LEGS SL), Place 2 tablets under the tongue at bedtime., Disp: , Rfl:    HYDROcodone -acetaminophen  (NORCO) 10-325 MG tablet, Take 1 tablet by mouth 2 (two) times daily as needed (pain.)., Disp: , Rfl:    ipratropium-albuterol  (DUONEB) 0.5-2.5 (3) MG/3ML SOLN, Take 3 mLs by nebulization every 4 (four) hours as needed., Disp: 360 mL, Rfl: 2   levothyroxine  (SYNTHROID ) 100 MCG tablet, Take 100 mcg by mouth daily before breakfast., Disp: , Rfl:    Omega-3 Fatty Acids (FISH OIL PO), Take 1 capsule by mouth in the morning., Disp: , Rfl:    rOPINIRole  (REQUIP ) 1 MG tablet, Take 1 tablet (1 mg total) by mouth 3 (three) times daily., Disp: 90 tablet, Rfl: 1   simvastatin (ZOCOR) 10 MG tablet, Take 10 mg by mouth 2 (two) times a week. Sundays & Thursdays, Disp: , Rfl:

## 2024-02-10 ENCOUNTER — Encounter: Payer: Self-pay | Admitting: Pulmonary Disease

## 2024-02-11 ENCOUNTER — Telehealth: Payer: Self-pay | Admitting: Pulmonary Disease

## 2024-02-11 NOTE — Telephone Encounter (Signed)
 Per Avelina with [facility] is this for a POC EVAL? Pt has O2 with us  (not a POC yet) but the provider note states unable to qualify for POC in office  Please advise.

## 2024-02-11 NOTE — Telephone Encounter (Signed)
 Notes have been updated and Faxed   [facility] could not do Poc because they stated to him that it only goes up to 3L - he needs one to go above 3L all started in referral order

## 2024-02-13 ENCOUNTER — Telehealth: Payer: Self-pay | Admitting: Pulmonary Disease

## 2024-02-13 NOTE — Telephone Encounter (Signed)
 Per Avelina with [facility]- PT asked to hold off on POC

## 2024-02-13 NOTE — Telephone Encounter (Signed)
 noted

## 2024-02-18 ENCOUNTER — Other Ambulatory Visit: Payer: Self-pay

## 2024-02-18 ENCOUNTER — Telehealth: Payer: Self-pay

## 2024-02-18 DIAGNOSIS — J9611 Chronic respiratory failure with hypoxia: Secondary | ICD-10-CM

## 2024-02-18 DIAGNOSIS — J432 Centrilobular emphysema: Secondary | ICD-10-CM

## 2024-02-18 NOTE — Telephone Encounter (Signed)
 Copied from CRM #[id number]. Topic: Clinical - Order For Equipment >> Feb 13, 2024  3:22 PM Isabell A wrote: Reason for CRM: Patient is requesting to speak to someone inregard to his medical equipment - requesting a prescription for the continous flow oxygenator sent to [facility].   Callback number: [phone]     Spoke w/ PT VBU. Sent in a new script for the oxlife independence   - NFN

## 2024-02-19 ENCOUNTER — Telehealth: Payer: Self-pay | Admitting: Pulmonary Disease

## 2024-02-19 ENCOUNTER — Telehealth: Payer: Self-pay

## 2024-02-19 NOTE — Telephone Encounter (Signed)
 Per Harry Young from [facility] We tried delivering a POC on 02/13/2024 and patient states he does not want post flow because it did not work for him the last time he is going to call his doctor and find out more information before having us  come out to deliver the equipment   Please advise

## 2024-02-19 NOTE — Telephone Encounter (Signed)
 Spoke w/ patient will get in touch with [facility].

## 2024-02-19 NOTE — Telephone Encounter (Signed)
 This is what we requested O2 concentrator POC O2 / [employer] Independence  it has wheels and a handle

## 2024-02-21 NOTE — Telephone Encounter (Signed)
 Reached out to patient to see if [facility] had got in touch with him about O2 concentrator POC O2 / [employer] Independence    VM LM - okay per [facility]

## 2024-02-25 ENCOUNTER — Telehealth: Payer: Self-pay | Admitting: Pulmonary Disease

## 2024-02-25 NOTE — Telephone Encounter (Signed)
 So travel as in they are traveling  [place] he wants it for.

## 2024-02-25 NOTE — Telephone Encounter (Signed)
 Per Avelina with [employer] That specific item is a continuous TOC. We give those out only for DC and travel.  Please advise.

## 2024-04-23 ENCOUNTER — Ambulatory Visit: Admitting: Pulmonary Disease

## 2024-04-23 ENCOUNTER — Encounter: Payer: Self-pay | Admitting: Pulmonary Disease

## 2024-04-23 VITALS — BP 108/68 | HR 100 | Ht 70.0 in | Wt 114.5 lb

## 2024-04-23 DIAGNOSIS — C679 Malignant neoplasm of bladder, unspecified: Secondary | ICD-10-CM

## 2024-04-23 DIAGNOSIS — R918 Other nonspecific abnormal finding of lung field: Secondary | ICD-10-CM | POA: Diagnosis not present

## 2024-04-23 DIAGNOSIS — J9610 Chronic respiratory failure, unspecified whether with hypoxia or hypercapnia: Secondary | ICD-10-CM | POA: Diagnosis not present

## 2024-04-23 DIAGNOSIS — R310 Gross hematuria: Secondary | ICD-10-CM

## 2024-04-23 DIAGNOSIS — J449 Chronic obstructive pulmonary disease, unspecified: Secondary | ICD-10-CM | POA: Diagnosis not present

## 2024-04-23 NOTE — Patient Instructions (Addendum)
 We will schedule you for CT Chest scan to follow up on lung nodules  Continue trelegy ellipta  1 puff daily - rinse mouth out after each use  We will refer you to Oncology for consideration of further treatment options of your bladder cancer  We will reach out to Urology to determine next best steps regarding the blood clots in your urine  Follow up in 3 months

## 2024-04-23 NOTE — Progress Notes (Unsigned)
 Established Patient Pulmonology Office Visit   Subjective:  Patient ID: Harry Young, male    DOB: [birth year]  MRN: [id number]  CC:  Chief Complaint  Patient presents with   Medical Management of Chronic Issues    Pt states lot of blood clots , bladder cancer     Discussed the use of AI scribe software for clinical note transcription with the patient, who gave verbal consent to proceed.  History of Present Illness Harry Young is a 68 year old male with chronic respiratory failure, COPD and lung nodules who presents for follow-up.  He reports decreased appetite and ongoing weight loss. He has increasing blood clots in his urine that are larger than before and can obstruct urine flow, requiring effort to pass. He has not followed with Urology since earlier this year.   He recently started Trelegy Ellipta  one puff daily and now uses a portable oxygen concentrator. He uses albuterol  only rarely.  He has known pulmonary nodules previously measured at 5 to 7 millimeters noted in September CT Chest scan.       {PULM QUESTIONNAIRES (Optional):33196}  ROS  {History (Optional):23778}  Current Outpatient Medications:    albuterol  (VENTOLIN  HFA) 108 (90 Base) MCG/ACT inhaler, Inhale 1-2 puffs into the lungs every 6 (six) hours as needed for shortness of breath or wheezing., Disp: , Rfl:    amLODipine  (NORVASC ) 2.5 MG tablet, Take 2.5 mg by mouth in the morning., Disp: , Rfl:    Calcium-Magnesium -Zinc (CAL-MAG-ZINC PO), Take 1 tablet by mouth daily with supper., Disp: , Rfl:    Cholecalciferol (VITAMIN D3 PO), Take 5,000 Units by mouth daily with supper., Disp: , Rfl:    Coenzyme Q10 (COQ10 PO), Take 1 capsule by mouth in the morning., Disp: , Rfl:    Dextromethorphan-guaiFENesin  (MUCINEX  FAST-MAX DM MAX) 5-100 MG/5ML LIQD, Take 15 mLs by mouth in the morning and at bedtime., Disp: , Rfl:    ezetimibe (ZETIA) 10 MG tablet, Take 10 mg by mouth daily at 12 noon., Disp: , Rfl:     Fluticasone -Umeclidin-Vilant (TRELEGY ELLIPTA ) 200-62.5-25 MCG/ACT AEPB, Inhale 1 puff into the lungs daily., Disp: 28 each, Rfl: 11   guaiFENesin  (ROBITUSSIN) 100 MG/5ML liquid, Take 10 mLs by mouth 3 (three) times daily. (Patient taking differently: Take 10 mLs by mouth 3 (three) times daily as needed for cough.), Disp: 120 mL, Rfl: 0   Homeopathic Products (RESTFUL LEGS SL), Place 2 tablets under the tongue at bedtime., Disp: , Rfl:    HYDROcodone -acetaminophen  (NORCO) 10-325 MG tablet, Take 1 tablet by mouth 2 (two) times daily as needed (pain.)., Disp: , Rfl:    ipratropium-albuterol  (DUONEB) 0.5-2.5 (3) MG/3ML SOLN, Take 3 mLs by nebulization every 4 (four) hours as needed., Disp: 360 mL, Rfl: 2   levothyroxine  (SYNTHROID ) 100 MCG tablet, Take 100 mcg by mouth daily before breakfast., Disp: , Rfl:    Omega-3 Fatty Acids (FISH OIL PO), Take 1 capsule by mouth in the morning., Disp: , Rfl:    rOPINIRole  (REQUIP ) 1 MG tablet, Take 1 tablet (1 mg total) by mouth 3 (three) times daily., Disp: 90 tablet, Rfl: 1   simvastatin (ZOCOR) 10 MG tablet, Take 10 mg by mouth 2 (two) times a week. Sundays & Thursdays, Disp: , Rfl:       Objective:  BP 108/68   Pulse 100   Ht 5' 10 (1.778 m) Comment: per pt  Wt 114 lb 8 oz (51.9 kg)   SpO2 96%  PF (!) 2 L/min Comment: cont.  BMI 16.43 kg/m   {Pulm Vitals (Optional):32837}  Physical Exam Constitutional:      General: He is not in acute distress.    Appearance: Normal appearance.  Eyes:     General: No scleral icterus.    Conjunctiva/sclera: Conjunctivae normal.  Cardiovascular:     Rate and Rhythm: Normal rate and regular rhythm.  Pulmonary:     Breath sounds: No wheezing, rhonchi or rales.  Musculoskeletal:     Right lower leg: No edema.     Left lower leg: No edema.  Skin:    General: Skin is warm and dry.  Neurological:     General: No focal deficit present.      Diagnostic Review:  Last CBC Lab Results  Component Value Date    WBC 7.8 12/31/2023   HGB 12.3 (L) 12/31/2023   HCT 39.4 12/31/2023   MCV 100.5 (H) 12/31/2023   MCH 31.4 12/31/2023   RDW 14.0 12/31/2023   PLT 272 12/31/2023   Last metabolic panel Lab Results  Component Value Date   GLUCOSE 108 (H) 12/31/2023   NA 139 12/31/2023   K 4.6 12/31/2023   CL 102 12/31/2023   CO2 29 12/31/2023   BUN 20 12/31/2023   CREATININE 1.35 (H) 12/31/2023   GFRNONAA 57 (L) 12/31/2023   CALCIUM 9.6 12/31/2023   PHOS 2.7 11/26/2023   PROT 6.5 11/23/2023   ALBUMIN 3.5 11/23/2023   BILITOT 0.9 11/23/2023   ALKPHOS 84 11/23/2023   AST 19 11/23/2023   ALT 15 11/23/2023   ANIONGAP 8 12/31/2023       Assessment & Plan:   Assessment & Plan Pulmonary nodules  Orders:   CT Chest Wo Contrast; Future  Urothelial carcinoma of bladder (HCC)  Orders:   Ambulatory referral to Hematology / Oncology  Gross hematuria      Assessment and Plan Assessment & Plan Pulmonary nodules Nodules 5-7 mm. Previous biopsy not performed due to anesthesia concerns. Current status unclear, requires updated imaging. - Ordered updated chest CT scan to assess nodules. - Consider biopsy if nodules increased in size.  Chronic respiratory failure Managed with Trelegy Ellipta  and portable oxygen concentrator. - Continue Trelegy Ellipta  one puff daily.  Chronic obstructive pulmonary disease Managed with Trelegy Ellipta . Albuterol  inhaler available but infrequently used. - Continue Trelegy Ellipta  one puff daily. - Use albuterol  inhaler as needed.  Urothelial carcinoma of bladder Recent increase in hematuria and blood clots. Previous surgery canceled due to anesthesia concerns. Urgency for treatment to prevent tumor growth and spread. Pending urology discussion for management plan. - Contacted urology to discuss symptoms and need for hospital admission or outpatient management. - Referred to oncology for evaluation and potential chemotherapy. - Await urology's response  regarding management plan.      Return in about 3 months (around 07/22/2024) for f/u visit Dr. Kara.   Dorn KATHEE Kara, MD

## 2024-04-24 ENCOUNTER — Other Ambulatory Visit: Payer: Self-pay | Admitting: Urology

## 2024-04-25 ENCOUNTER — Ambulatory Visit: Admitting: Pulmonary Disease

## 2024-05-01 ENCOUNTER — Telehealth: Payer: Self-pay | Admitting: *Deleted

## 2024-05-01 ENCOUNTER — Telehealth: Payer: Self-pay | Admitting: Pulmonary Disease

## 2024-05-01 NOTE — Telephone Encounter (Signed)
 Fax received from Dr. Ricardo Likens with [facility] to perform a transurethral resection of bladder tumor on patient.  Patient needs surgery clearance. Surgery is 05/14/24. Patient was seen on 04/23/24. Office protocol is a risk assessment can be sent to surgeon if patient has been seen in 60 days or less.   Sending to Dr. Kara for risk assessment or recommendations if patient needs to be seen in office prior to surgical procedure.

## 2024-05-01 NOTE — Telephone Encounter (Signed)
 ARISCAT Score for Postoperative Pulmonary Complications Predicts risk of pulmonary complications after surgery, including respiratory failure. Intermediate risk to High risk 13.3%- 42.1% risk of in-hospital post-op pulmonary complications (composite including respiratory failure, respiratory infection, pleural effusion, atelectasis, pneumothorax, bronchospasm, aspiration pneumonitis)  Dorn Chill, MD [facility] Office: [phone]   See Amion for personal pager PCCM on call pager [phone] until 7pm. Please call Elink 7p-7a. [phone]

## 2024-05-01 NOTE — Progress Notes (Unsigned)
 [facility] CONSULT NOTE  Patient Care Team: Corlis Pagan, NP as PCP - General  ASSESSMENT & PLAN:  Harry Young is a 68 y.o.male with history of chronic respiratory failure, COPD and lung nodules, Ta NMIBC, Bilateral carotid artery stenosis, CAD, HTN, HLD, CKD 3, back surgery with chronic back pain being [facility] for lung nodules..  Clinically with chronic respiratory failure and emphysema. PS is limited. There is bladder tumor waiting to be resected. He has CT chest scheduled. Discussed will obtain CT of A/P as well to rule out any potential primary. If increasing size of pulmonary nodule, will need to get a biopsy. Assessment & Plan Urothelial carcinoma of kidney, left (HCC) Previously NMIBC. Communicated with Urology planning on TURBT this month Will obtain CT A/P with Lung to rule out other primary  Macrocytic anemia CBC, Iron panel, b12, folate and ferritin Pulmonary nodule Multiple nodules Repeat CT today. If increasing in sizes, will need biopsy. Will communicate with his pulmonologist.  Orders Placed This Encounter  Procedures   CT ABDOMEN PELVIS W WO CONTRAST    Per Dr Tina, change to without if labs abn    Standing Status:   Future    Number of Occurrences:   1    Expected Date:   05/02/2024    Expiration Date:   05/02/2025    If indicated for the ordered procedure, I authorize the administration of contrast media per Radiology protocol:   Yes    Does the patient have a contrast media/X-ray dye allergy?:   No    Preferred imaging location?:   [facility]    If indicated for the ordered procedure, I authorize the administration of oral contrast media per Radiology protocol:   Yes   CBC with Differential (Cancer Center Only)    Standing Status:   Future    Number of Occurrences:   1    Expiration Date:   05/02/2025   CMP (Cancer Center only)    Standing Status:   Future    Number of Occurrences:   1    Expiration Date:    05/02/2025   Iron and Iron Binding Capacity ([facility] only)    Standing Status:   Future    Number of Occurrences:   1    Expiration Date:   05/02/2025   Lactate dehydrogenase    Standing Status:   Future    Number of Occurrences:   1    Expiration Date:   05/02/2025   Ferritin    Standing Status:   Future    Number of Occurrences:   1    Expiration Date:   05/02/2025   Folate    Standing Status:   Future    Number of Occurrences:   1    Expiration Date:   05/02/2025   Vitamin B12    Standing Status:   Future    Number of Occurrences:   1    Expiration Date:   05/02/2025   Pauletta JAYSON Tina, MD 12/19/20253:50 PM  CHIEF COMPLAINTS/PURPOSE OF CONSULTATION:  Lung nodules with history of bladder cancer  HISTORY OF PRESENTING ILLNESS:  Harry Young 68 y.o. male is here because of history of pulmonary nodules with history of urothelial carcinoma. I have reviewed his chart and materials related to his cancer extensively and collaborated history with the patient.  Report history of chronic respiratory failure and COPD managed with trilogy Ellipta and portable oxygen.  He has a  history of urothelial carcinoma.  Report recently having hematuria.  Patient has biopsy of pulmonary nodules LUL, in 2023 negative for malignancy. 02/28/22 FINAL MICROSCOPIC DIAGNOSIS:  - Low grade urothelial neoplasm  - Low-grade papillary urothelial carcinoma   03/2022 CT lung cancer screening 1. Lung-RADS 2, benign appearance or behavior. Continue annual screening with low-dose chest CT without contrast in 12 months. The hypermetabolic left upper lobe pulmonary nodule has decreased in size, status post benign biopsy 01/09/2022.  05/10/22 A. LEFT KIDNEY WITH URETER AND BLADDER CUFF, NEPHROURETERECTOMY:  Low-grade papillary urothelial carcinoma  Negative for invasive carcinoma (pTa)  Tumor measures 4.3 x 4.0 x 0.5 cm  Margins free  Chronic interstitial nephritis  Benign adrenal gland   04/27/23 A.  BLADDER TUMOR, TURBT:  Noninvasive high grade papillary urothelial carcinoma  Muscularis propria (detrusor muscle) is present and not involved   B. BLADDER TUMOR, BASE, BIOPSY:  Submucosa and muscularis  Negative for carcinoma   11/22/23 CTA: Multiple bilateral new pulmonary nodules concerning for pulmonary metastases.  Emphysema.  01/23/2024 CT chest showed: Multiple pulmonary nodules measuring up to 7 mm concerning for Metastasis.  Few subcentimeter mediastinal lymph nodes.  Report at 4 no polyps.  MEDICAL HISTORY:  Past Medical History:  Diagnosis Date   Anxiety disorder, unspecified    Arthritis    Asthma    as a child   Asymmetrical hearing loss of left ear 01/03/2018   Bilateral carotid artery stenosis 08/07/2015   Dr. Dorn Lesches- 04/01/15- Heterogenous plaque- 1-39% RICA stenosis.  Homogenous plaque- 40-59% LICA stenosis.  >50% LECA stenosis.  Normal subclavian arteries bilaterally.  Patent vertebral arteries with antegrade flow.   Complication of anesthesia    slow to wake up   Constipation 08/07/2015   COPD (chronic obstructive pulmonary disease) (HCC)    Coronary artery disease involving native coronary artery of native heart without angina pectoris 04/03/2019   Displacement of lumbar intervertebral disc without myelopathy 04/14/2013   Dyspnea    Essential (primary) hypertension 04/03/2019   Golfer's elbow, right 03/30/2016   Heart murmur    History of kidney removal    left kidney . due to cancer   History of kidney stones    Hyperlipidemia 08/07/2015   Hypothyroidism due to Hashimoto's thyroiditis 07/15/2015   Leg weakness 08/07/2015   Lumbar radiculopathy 10/21/2013   Nocturia    Nonrheumatic aortic valve insufficiency 02/22/2019   Nonrheumatic aortic valve stenosis 02/22/2019   Pneumonia    Spinal stenosis of lumbar region 05/19/2015   Transitional cell carcinoma of bladder (HCC) UROLOGIST--  DR CEIL   S/P TURBT X3--  HX BCG TX'S   Vitamin D  deficiency     SURGICAL HISTORY: Past Surgical History:  Procedure Laterality Date   BRONCHIAL BIOPSY  01/09/2022   Procedure: BRONCHIAL BIOPSIES;  Surgeon: Gladis Leonor HERO, MD;  Location: [facility] ENDOSCOPY;  Service: Pulmonary;;   BRONCHIAL BRUSHINGS  01/09/2022   Procedure: BRONCHIAL BRUSHINGS;  Surgeon: Gladis Leonor HERO, MD;  Location: [facility] ENDOSCOPY;  Service: Pulmonary;;   BRONCHIAL NEEDLE ASPIRATION BIOPSY  01/09/2022   Procedure: BRONCHIAL NEEDLE ASPIRATION BIOPSIES;  Surgeon: Gladis Leonor HERO, MD;  Location: [facility] ENDOSCOPY;  Service: Pulmonary;;   CYSTOSCOPY  05/10/2022   Procedure: CYSTOSCOPY WITH STENT REMOVAL;  Surgeon: Alvaro Hummer, MD;  Location: [facility];  Service: Urology;;   PHYLLIS W/ RETROGRADES Bilateral 12/13/2012   Procedure: CYSTOSCOPY WITH RETROGRADE PYELOGRAM;  Surgeon: Mark C Ottelin, MD;  Location: [facility];  Service: Urology;  Laterality:  Bilateral;   CYSTOSCOPY W/ RETROGRADES Right 04/27/2023   Procedure: CYSTOSCOPY WITH RIGHT RETROGRADE PYELOGRAM;  Surgeon: Alvaro Ricardo KATHEE Mickey., MD;  Location: [facility];  Service: Urology;  Laterality: Right;  60 MINUTES NEEDED FOR CASE   CYSTOSCOPY WITH LITHOLAPAXY N/A 04/04/2013   Procedure: CYSTOSCOPY ;  Surgeon: Oneil JAYSON Rafter, MD;  Location: [facility];  Service: Urology;  Laterality: N/A;   CYSTOSCOPY WITH RETROGRADE PYELOGRAM, URETEROSCOPY AND STENT PLACEMENT Bilateral 02/28/2022   Procedure: CYSTOSCOPY WITH RETROGRADE PYELOGRAM, URETEROSCOPY, LEFT STENT PLACEMENT;  Surgeon: Rosalind Zachary KATHEE, MD;  Location: [facility];  Service: Urology;  Laterality: Bilateral;  1 HR   LUMBAR DISC SURGERY  09/20/2005   L2 -- L5   LUMBAR LAMINECTOMY/DECOMPRESSION MICRODISCECTOMY Left 01/09/2013   Procedure: Left  lumbar four-five microdiskectomy;  Surgeon: Rockey LITTIE Peru, MD;  Location: [facility];  Service: Neurosurgery;  Laterality: Left;   LUMBAR WOUND DEBRIDEMENT N/A 01/13/2013   Procedure: LUMBAR  WOUND DEBRIDEMENT;  Surgeon: Victory DELENA Gunnels, MD;  Location: [facility];  Service: Neurosurgery;  Laterality: N/A;  Lumbar Wound Exploration,  Attempted Placement of Cerebrospinal Drain, and Repair of CSF Leak   ROBOT ASSITED LAPAROSCOPIC NEPHROURETERECTOMY Left 05/10/2022   Procedure: XI ROBOT ASSITED LAPAROSCOPIC NEPHROURETERECTOMY;  Surgeon: Alvaro Ricardo, MD;  Location: [facility];  Service: Urology;  Laterality: Left;   TOOTH EXTRACTION     TRANSURETHRAL RESECTION OF BLADDER TUMOR  07-05-2009;   11-22-2009   bladder cancer   TRANSURETHRAL RESECTION OF BLADDER TUMOR N/A 12/13/2012   Procedure: TRANSURETHRAL RESECTION OF BLADDER TUMOR (TURBT) AND INSTILLATION OF MYTOMYCIN;  Surgeon: Mark C Ottelin, MD;  Location: [facility];  Service: Urology;  Laterality: N/A;   TRANSURETHRAL RESECTION OF BLADDER TUMOR N/A 04/27/2023   Procedure: TRANSURETHRAL RESECTION OF BLADDER TUMOR (TURBT);  Surgeon: Alvaro Ricardo KATHEE Mickey., MD;  Location: [facility];  Service: Urology;  Laterality: N/A;   TRANSURETHRAL RESECTION OF BLADDER TUMOR WITH GYRUS (TURBT-GYRUS) N/A 04/04/2013   Procedure: TRANSURETHRAL RESECTION OF BLADDER TUMOR WITH GYRUS (TURBT-GYRUS);  Surgeon: Mark C Ottelin, MD;  Location: [facility];  Service: Urology;  Laterality: N/A;    SOCIAL HISTORY: Social History   Socioeconomic History   Marital status: Married    Spouse name: Not on file   Number of children: 0   Years of education: Not on file   Highest education level: Not on file  Occupational History   Not on file  Tobacco Use   Smoking status: Some Days    Current packs/day: 1.00    Average packs/day: 1 pack/day for 55.8 years (55.8 ttl pk-yrs)    Types: Cigarettes    Start date: 07/04/1968   Smokeless tobacco: Never  Vaping Use   Vaping status: Never Used  Substance and Sexual Activity   Alcohol use: No   Drug use: No   Sexual activity: Not Currently  Other Topics Concern   Not on file  Social  History Narrative   Not on file   Social Drivers of Health   Tobacco Use: High Risk (05/02/2024)   Patient History    Smoking Tobacco Use: Some Days    Smokeless Tobacco Use: Never    Passive Exposure: Not on file  Financial Resource Strain: Not on file  Food Insecurity: No Food Insecurity (05/02/2024)   ACO Reach    Worried About Running Out of Food in the Last Year: No    Ran Out of Food in the Last Year:  No  Transportation Needs: No Transportation Needs (05/02/2024)   ACO Reach    Lack of Transportation: No  Physical Activity: Not on file  Stress: Not on file  Social Connections: Patient Unable To Answer (11/23/2023)   Social Connection and Isolation Panel    Frequency of Communication with Friends and Family: Patient unable to answer    Frequency of Social Gatherings with Friends and Family: Patient unable to answer    Attends Religious Services: Patient unable to answer    Active Member of Clubs or Organizations: Patient unable to answer    Attends Banker Meetings: Patient unable to answer    Marital Status: Patient unable to answer  Intimate Partner Violence: At Risk (05/02/2024)   ACO Reach    Feels Physically and Emotionally Safe: No    Physically Hurt by Someone: No    Humiliated or Emotionally Abused by Someone: Not on file  Depression (PHQ2-9): Low Risk (11/30/2023)   Depression (PHQ2-9)    PHQ-2 Score: 0  Alcohol Screen: Not on file  Housing: At Risk (05/02/2024)   ACO Reach    Has Housing: No    Worried About Losing Housing: No    Unable to Get Utilities: No  Utilities: At Risk (05/02/2024)   ACO Reach    Has Housing: No    Worried About Losing Housing: No    Unable to Get Utilities: No  Health Literacy: Not on file    FAMILY HISTORY: Family History  Problem Relation Age of Onset   Hyperlipidemia Mother    Heart disease Father    Heart failure Father    COPD Father    Diabetes Brother     ALLERGIES:  is allergic to crestor  [rosuvastatin] and sulfa antibiotics.  MEDICATIONS:  Current Outpatient Medications  Medication Sig Dispense Refill   albuterol  (VENTOLIN  HFA) 108 (90 Base) MCG/ACT inhaler Inhale 1-2 puffs into the lungs every 6 (six) hours as needed for shortness of breath or wheezing.     amLODipine  (NORVASC ) 2.5 MG tablet Take 2.5 mg by mouth in the morning.     Calcium-Magnesium -Zinc (CAL-MAG-ZINC PO) Take 1 tablet by mouth daily with supper. On hold for surgery     Cholecalciferol (VITAMIN D3 PO) Take 5,000 Units by mouth daily with supper. On hold for surgery     Coenzyme Q10 (COQ10 PO) Take 1 capsule by mouth in the morning. On hold for surgery     Dextromethorphan-guaiFENesin  (MUCINEX  FAST-MAX DM MAX) 5-100 MG/5ML LIQD Take 15 mLs by mouth in the morning and at bedtime.     ezetimibe (ZETIA) 10 MG tablet Take 10 mg by mouth daily in the afternoon.     Fluticasone -Umeclidin-Vilant (TRELEGY ELLIPTA ) 200-62.5-25 MCG/ACT AEPB Inhale 1 puff into the lungs daily. 28 each 11   Homeopathic Products (RESTFUL LEGS SL) Place 2 tablets under the tongue at bedtime.     HYDROcodone -acetaminophen  (NORCO) 10-325 MG tablet Take 1 tablet by mouth 2 (two) times daily as needed (pain.).     levothyroxine  (SYNTHROID ) 100 MCG tablet Take 100 mcg by mouth See admin instructions. TAKE 1 TABLET IN AM ON AN EMPTY STOMACH MONDAY-FRIDAY AND SKIP THE WEEKENDS ONCE A DAY BY MOUTH     Omega-3 Fatty Acids (FISH OIL PO) Take 1 capsule by mouth in the morning.     simvastatin (ZOCOR) 10 MG tablet Take 10 mg by mouth once a week.     No current facility-administered medications for this visit.    REVIEW  OF SYSTEMS:   All relevant systems were reviewed with the patient and are negative.  PHYSICAL EXAMINATION: ECOG PERFORMANCE STATUS: 2  Vitals:   05/02/24 1128  BP: 110/64  Pulse: (!) 104  Resp: 18  Temp: (!) 97.3 F (36.3 C)  SpO2: 98%   Filed Weights   05/02/24 1128  Weight: 113 lb 8 oz (51.5 kg)    GENERAL: alert,  no distress and comfortable SKIN: skin color is pale, no jaundice EYES: sclera clear OROPHARYNX: no exudate, no erythema NECK: supple LYMPH:  no palpable lymphadenopathy in the cervical, axillary regions LUNGS: Effort normal, no respiratory distress.  Clear to auscultation bilaterally Decrease air entry in general HEART: regular rate & rhythm and no lower extremity edema ABDOMEN: soft, non-tender and nondistended Musculoskeletal: no edema   LABORATORY DATA:  I have reviewed the data as listed Lab Results  Component Value Date   WBC 9.7 05/02/2024   HGB 8.5 (L) 05/02/2024   HCT 26.0 (L) 05/02/2024   MCV 93.5 05/02/2024   PLT 252 05/02/2024   Recent Labs    11/22/23 1148 11/23/23 0451 11/25/23 0330 11/28/23 1230 12/31/23 0830 05/02/24 1236  NA 139 135   < > 139 139 142  K 4.3 3.4*   < > 4.0 4.6 3.9  CL 101 101   < > 102 102 102  CO2 26 25   < > 29 29 30   GLUCOSE 152* 260*   < > 124* 108* 125*  BUN 18 31*   < > 38* 20 25*  CREATININE 1.32* 1.66*   < > 1.08 1.35* 1.44*  CALCIUM 9.6 9.1   < > 7.8* 9.6 9.5  GFRNONAA 58* 44*   < > >60 57* 53*  PROT 8.3* 6.5  --   --   --  6.5  ALBUMIN 4.7 3.5  --   --   --  4.3  AST 22 19  --   --   --  21  ALT 19 15  --   --   --  13  ALKPHOS 108 84  --   --   --  99  BILITOT 1.9* 0.9  --   --   --  0.3   < > = values in this interval not displayed.    RADIOGRAPHIC STUDIES: I have personally reviewed the radiological images as listed and agreed with the findings in the report.  New imaging ordered and communicated with radiology to coordinate.

## 2024-05-01 NOTE — Patient Instructions (Signed)
 SURGICAL WAITING ROOM VISITATION Patients having surgery or a procedure may have no more than 2 support people in the waiting area - these visitors may rotate in the visitor waiting room.   Due to an increase in RSV and influenza rates and associated hospitalizations, children ages 22 and under may not visit patients in [facility] hospitals. If the patient needs to stay at the hospital during part of their recovery, the visitor guidelines for inpatient rooms apply.  PRE-OP VISITATION  Pre-op nurse will coordinate an appropriate time for 1 support person to accompany the patient in pre-op.  This support person may not rotate.  This visitor will be contacted when the time is appropriate for the visitor to come back in the pre-op area.  Please refer to the [facility] website for the visitor guidelines for Inpatients (after your surgery is over and you are in a regular room).  You are not required to quarantine at this time prior to your surgery. However, you must do this: Hand Hygiene often Do NOT share personal items Notify your provider if you are in close contact with someone who has COVID or you develop fever 100.4 or greater, new onset of sneezing, cough, sore throat, shortness of breath or body aches.  If you test positive for Covid or have been in contact with anyone that has tested positive in the last 10 days please notify you surgeon.    Your procedure is scheduled on: 05/14/24   Report to [facility] Main Entrance: [place] entrance where the [employer] is available.   Report to admitting at:12:30     PM  Call this number if you have any questions or problems the morning of surgery [phone]  FOLLOW ANY ADDITIONAL PRE OP INSTRUCTIONS YOU RECEIVED FROM YOUR SURGEON'S OFFICE!!!  Do not eat food after Midnight the night prior to your surgery/procedure.  After Midnight you may have the following liquids until: 8:30 AM DAY OF SURGERY  Clear Liquid Diet Water  Black  Coffee (sugar ok, NO MILK/CREAM OR CREAMERS)  Tea (sugar ok, NO MILK/CREAM OR CREAMERS) regular and decaf                             Plain Jell-O  with no fruit (NO RED)                                           Fruit ices (not with fruit pulp, NO RED)                                     Popsicles (NO RED)                                                                  Juice: NO CITRUS JUICES: only apple, WHITE grape, WHITE cranberry Sports drinks like Gatorade or Powerade (NO RED)   Oral Hygiene is also important to reduce your risk of infection.        Remember - BRUSH YOUR TEETH THE MORNING OF SURGERY WITH  YOUR REGULAR TOOTHPASTE  Do NOT smoke after Midnight the night before surgery.  STOP TAKING all Vitamins, Herbs and supplements 1 week before your surgery.   Take ONLY these medicines the morning of surgery with A SIP OF WATER : amlodipine ,levothyroxine .Use inhalers as usual and bring them.                   You may not have any metal on your body including hair pins, jewelry, and body piercing  Do not wear lotions, powders, perfumes / cologne, or deodorant  Men may shave face and neck.  Contacts, Hearing Aids, dentures or bridgework may not be worn into surgery. DENTURES WILL BE REMOVED PRIOR TO SURGERY PLEASE DO NOT APPLY Poly grip OR ADHESIVES!!!  You may bring a small overnight bag with you on the day of surgery, only pack items that are not valuable. [facility] IS NOT RESPONSIBLE   FOR VALUABLES THAT ARE LOST OR STOLEN.   Patients discharged on the day of surgery will not be allowed to drive home.  Someone NEEDS to stay with you for the first 24 hours after anesthesia.  Do not bring your home medications to the hospital. The Pharmacy will dispense medications listed on your medication list to you during your admission in the Hospital.  Special Instructions: Bring a copy of your healthcare power of attorney and living will documents the day of surgery, if you wish to have  them scanned into your [facility] Records- EPIC  Please read over the following fact sheets you were given: IF YOU HAVE QUESTIONS ABOUT YOUR PRE-OP INSTRUCTIONS, PLEASE CALL [phone]   [facility] - Preparing for Surgery      Before surgery, you can play an important role.  Because skin is not sterile, your skin needs to be as free of germs as possible.  You can reduce the number of germs on your skin by washing with CHG (chlorahexidine gluconate) soap before surgery.  CHG is an antiseptic cleaner which kills germs and bonds with the skin to continue killing germs even after washing. Please DO NOT use if you have an allergy to CHG or antibacterial soaps.  If your skin becomes reddened/irritated stop using the CHG and inform your nurse when you arrive at Short Stay. Do not shave (including legs and underarms) for at least 48 hours prior to the first CHG shower.  You may shave your face/neck.  Please follow these instructions carefully:  1.  Shower with CHG Soap the night before surgery ONLY (DO NOT USE THE SOAP THE MORNING OF SURGERY).  2.  If you choose to wash your hair, wash your hair first as usual with your normal  shampoo.  3.  After you shampoo, rinse your hair and body thoroughly to remove the shampoo.                             4.  Use CHG as you would any other liquid soap.  You can apply chg directly to the skin and wash.  Gently with a scrungie or clean washcloth.  5.  Apply the CHG Soap to your body ONLY FROM THE NECK DOWN.   Do not use on face/ open                           Wound or open sores. Avoid contact with eyes, ears mouth and genitals (private parts).  Wash face,  Genitals (private parts) with your normal soap.             6.  Wash thoroughly, paying special attention to the area where your  surgery  will be performed.  7.  Thoroughly rinse your body with warm water  from the neck down.  8.  DO NOT shower/wash with your normal soap after  using and rinsing off the CHG Soap.                9.  Pat yourself dry with a clean towel.            10.  Wear clean pajamas.            11.  Place clean sheets on your bed the night of your first shower and do not  sleep with pets.  Day of Surgery : Do not apply any CHG, lotions/deodorants the morning of surgery.  Please wear clean clothes to the hospital/surgery center.   FAILURE TO FOLLOW THESE INSTRUCTIONS MAY RESULT IN THE CANCELLATION OF YOUR SURGERY  PATIENT SIGNATURE_________________________________  NURSE SIGNATURE__________________________________  ________________________________________________________________________

## 2024-05-01 NOTE — Telephone Encounter (Signed)
 Copied from CRM #[id number]. Topic: General - Other >> Apr 29, 2024 11:11 AM Benton O wrote: Reason for CRM: [facility] is calling because they sent over a surgical clearance request last week wanted to know have the clinic received it and whats the status. Patient has surgery on the 19th  [id number] rebecca  Duplicate   Also note that the surgery is planned for 05/14/24.

## 2024-05-02 ENCOUNTER — Ambulatory Visit (HOSPITAL_COMMUNITY)
Admission: RE | Admit: 2024-05-02 | Discharge: 2024-05-02 | Disposition: A | Discharge status: Home / Self Care | Source: Ambulatory Visit | Attending: Pulmonary Disease | Admitting: Pulmonary Disease

## 2024-05-02 ENCOUNTER — Inpatient Hospital Stay

## 2024-05-02 ENCOUNTER — Other Ambulatory Visit (HOSPITAL_COMMUNITY)

## 2024-05-02 ENCOUNTER — Encounter: Payer: Self-pay | Admitting: Pulmonary Disease

## 2024-05-02 VITALS — BP 110/64 | HR 104 | Temp 97.3°F | Resp 18 | Wt 113.5 lb

## 2024-05-02 DIAGNOSIS — D539 Nutritional anemia, unspecified: Secondary | ICD-10-CM

## 2024-05-02 DIAGNOSIS — R911 Solitary pulmonary nodule: Secondary | ICD-10-CM | POA: Diagnosis not present

## 2024-05-02 DIAGNOSIS — R918 Other nonspecific abnormal finding of lung field: Secondary | ICD-10-CM | POA: Insufficient documentation

## 2024-05-02 DIAGNOSIS — C642 Malignant neoplasm of left kidney, except renal pelvis: Secondary | ICD-10-CM | POA: Insufficient documentation

## 2024-05-02 DIAGNOSIS — C679 Malignant neoplasm of bladder, unspecified: Secondary | ICD-10-CM | POA: Insufficient documentation

## 2024-05-02 DIAGNOSIS — Z85528 Personal history of other malignant neoplasm of kidney: Secondary | ICD-10-CM | POA: Diagnosis not present

## 2024-05-02 DIAGNOSIS — F17210 Nicotine dependence, cigarettes, uncomplicated: Secondary | ICD-10-CM | POA: Insufficient documentation

## 2024-05-02 DIAGNOSIS — Z882 Allergy status to sulfonamides status: Secondary | ICD-10-CM | POA: Diagnosis not present

## 2024-05-02 DIAGNOSIS — Z905 Acquired absence of kidney: Secondary | ICD-10-CM | POA: Diagnosis not present

## 2024-05-02 LAB — CBC WITH DIFFERENTIAL (CANCER CENTER ONLY)
Abs Immature Granulocytes: 0.02 K/uL (ref 0.00–0.07)
Basophils Absolute: 0.1 K/uL (ref 0.0–0.1)
Basophils Relative: 1 %
Eosinophils Absolute: 0.1 K/uL (ref 0.0–0.5)
Eosinophils Relative: 1 %
HCT: 26 % — ABNORMAL LOW (ref 39.0–52.0)
Hemoglobin: 8.5 g/dL — ABNORMAL LOW (ref 13.0–17.0)
Immature Granulocytes: 0 %
Lymphocytes Relative: 27 %
Lymphs Abs: 2.6 K/uL (ref 0.7–4.0)
MCH: 30.6 pg (ref 26.0–34.0)
MCHC: 32.7 g/dL (ref 30.0–36.0)
MCV: 93.5 fL (ref 80.0–100.0)
Monocytes Absolute: 0.6 K/uL (ref 0.1–1.0)
Monocytes Relative: 6 %
Neutro Abs: 6.3 K/uL (ref 1.7–7.7)
Neutrophils Relative %: 65 %
Platelet Count: 252 K/uL (ref 150–400)
RBC: 2.78 MIL/uL — ABNORMAL LOW (ref 4.22–5.81)
RDW: 14 % (ref 11.5–15.5)
WBC Count: 9.7 K/uL (ref 4.0–10.5)
nRBC: 0 % (ref 0.0–0.2)

## 2024-05-02 LAB — CMP (CANCER CENTER ONLY)
ALT: 13 U/L (ref 0–44)
AST: 21 U/L (ref 15–41)
Albumin: 4.3 g/dL (ref 3.5–5.0)
Alkaline Phosphatase: 99 U/L (ref 38–126)
Anion gap: 10 (ref 5–15)
BUN: 25 mg/dL — ABNORMAL HIGH (ref 8–23)
CO2: 30 mmol/L (ref 22–32)
Calcium: 9.5 mg/dL (ref 8.9–10.3)
Chloride: 102 mmol/L (ref 98–111)
Creatinine: 1.44 mg/dL — ABNORMAL HIGH (ref 0.61–1.24)
GFR, Estimated: 53 mL/min — ABNORMAL LOW
Glucose, Bld: 125 mg/dL — ABNORMAL HIGH (ref 70–99)
Potassium: 3.9 mmol/L (ref 3.5–5.1)
Sodium: 142 mmol/L (ref 135–145)
Total Bilirubin: 0.3 mg/dL (ref 0.0–1.2)
Total Protein: 6.5 g/dL (ref 6.5–8.1)

## 2024-05-02 LAB — IRON AND IRON BINDING CAPACITY (CC-WL,HP ONLY)
Iron: 59 ug/dL (ref 45–182)
Saturation Ratios: 18 % (ref 17.9–39.5)
TIBC: 333 ug/dL (ref 250–450)
UIBC: 275 ug/dL

## 2024-05-02 LAB — VITAMIN B12: Vitamin B-12: 282 pg/mL (ref 180–914)

## 2024-05-02 LAB — FERRITIN: Ferritin: 69 ng/mL (ref 24–336)

## 2024-05-02 LAB — FOLATE: Folate: 4 ng/mL — ABNORMAL LOW

## 2024-05-02 LAB — LACTATE DEHYDROGENASE: LDH: 157 U/L (ref 105–235)

## 2024-05-02 MED ORDER — IOHEXOL 300 MG/ML  SOLN
100.0000 mL | Freq: Once | INTRAMUSCULAR | Status: AC | PRN
  Administered 2024-05-02: 100 mL via INTRAVENOUS

## 2024-05-02 NOTE — Assessment & Plan Note (Deleted)
 Follow up with CT today as planned. Will obtain CT abdomen.

## 2024-05-02 NOTE — Assessment & Plan Note (Addendum)
 Multiple nodules Repeat CT today. If increasing in sizes, will need biopsy. Will communicate with his pulmonologist.

## 2024-05-02 NOTE — Assessment & Plan Note (Addendum)
 Previously NMIBC. Communicated with Urology planning on TURBT this month Will obtain CT A/P with Lung to rule out other primary

## 2024-05-02 NOTE — Assessment & Plan Note (Signed)
" >>  ASSESSMENT AND PLAN FOR UROTHELIAL CARCINOMA OF KIDNEY, LEFT (HCC) WRITTEN ON 05/02/2024  3:54 PM BY Khali Perella C, MD  Previously NMIBC. Communicated with Urology planning on TURBT this month Will obtain CT A/P with Lung to rule out other primary  "

## 2024-05-04 ENCOUNTER — Ambulatory Visit: Payer: Self-pay

## 2024-05-05 ENCOUNTER — Encounter (HOSPITAL_COMMUNITY)
Admission: RE | Admit: 2024-05-05 | Discharge: 2024-05-05 | Disposition: A | Discharge status: Home / Self Care | Source: Ambulatory Visit | Attending: Urology | Admitting: Urology

## 2024-05-05 ENCOUNTER — Other Ambulatory Visit: Payer: Self-pay

## 2024-05-05 ENCOUNTER — Encounter (HOSPITAL_COMMUNITY): Payer: Self-pay

## 2024-05-05 VITALS — BP 103/59 | HR 72 | Temp 97.9°F | Ht 70.0 in | Wt 116.0 lb

## 2024-05-05 DIAGNOSIS — C678 Malignant neoplasm of overlapping sites of bladder: Secondary | ICD-10-CM | POA: Insufficient documentation

## 2024-05-05 DIAGNOSIS — J4489 Other specified chronic obstructive pulmonary disease: Secondary | ICD-10-CM | POA: Diagnosis not present

## 2024-05-05 DIAGNOSIS — R918 Other nonspecific abnormal finding of lung field: Secondary | ICD-10-CM | POA: Insufficient documentation

## 2024-05-05 DIAGNOSIS — I700 Atherosclerosis of aorta: Secondary | ICD-10-CM | POA: Diagnosis not present

## 2024-05-05 DIAGNOSIS — Z01812 Encounter for preprocedural laboratory examination: Secondary | ICD-10-CM | POA: Diagnosis present

## 2024-05-05 DIAGNOSIS — I351 Nonrheumatic aortic (valve) insufficiency: Secondary | ICD-10-CM | POA: Insufficient documentation

## 2024-05-05 DIAGNOSIS — I10 Essential (primary) hypertension: Secondary | ICD-10-CM | POA: Diagnosis not present

## 2024-05-05 DIAGNOSIS — I2510 Atherosclerotic heart disease of native coronary artery without angina pectoris: Secondary | ICD-10-CM | POA: Insufficient documentation

## 2024-05-05 DIAGNOSIS — E063 Autoimmune thyroiditis: Secondary | ICD-10-CM | POA: Insufficient documentation

## 2024-05-05 DIAGNOSIS — J9610 Chronic respiratory failure, unspecified whether with hypoxia or hypercapnia: Secondary | ICD-10-CM | POA: Insufficient documentation

## 2024-05-05 HISTORY — DX: Anemia, unspecified: D64.9

## 2024-05-05 NOTE — Telephone Encounter (Signed)
-----   Message from Pauletta Chihuahua, MD sent at 05/05/2024  7:38 AM EST ----- Arland please let him know both folate and b12 were low. Take a b-complex vitamin daily that should include both. Thanks.

## 2024-05-05 NOTE — Telephone Encounter (Signed)
 Spoke with the patient's wife. Informed her of Dr Fredrik instructions. VU no other issues noted. Arland Legions BSN RN

## 2024-05-05 NOTE — Progress Notes (Addendum)
 " Case: [id number] Date/Time: 05/14/24 1430   Procedures:      TURBT (TRANSURETHRAL RESECTION OF BLADDER TUMOR) (Right)     CYSTOSCOPY, WITH RETROGRADE PYELOGRAM (Right)   Anesthesia type: General   Diagnosis: Malignant neoplasm of overlapping sites of bladder (HCC) [C67.8]   Pre-op diagnosis: RECURRENT BLADDER CANCER   Location: [facility]   Surgeons: Alvaro Ricardo KATHEE Mickey., MD       DISCUSSION: Harry Young is a 68 yo male with PMH of current smoking, asthma/COPD with chronic respiratory failure, HTN, CAD (by imaging), moderate aortic stenosis, CAS, hypothyroid, anemia, anxiety, bladder cancer s/p multiple TURBTs, s/p left nephroureterectomy (2023).  Prior complication from anesthesia includes prolonged emergence  Surgery has been delayed due to respiratory issues. Patient came to PAT visit in July 2025 and was admitted to the ICU for a COPD exacerbation and CAP. He was discharged on home O2.   Patient followed up with Pulmonology. Last seen on 04/23/24. Pt reported weight loss, no appetite, blood clots in urine. He is on inhalers and using portable O2 concentrator. Pulmonary nodules are being monitored. No recent exacerbations. Per Dr. Kara regarding risk assessment:  ARISCAT Score for Postoperative Pulmonary Complications Predicts risk of pulmonary complications after surgery, including respiratory failure. Intermediate risk to High risk 13.3%- 42.1% risk of in-hospital post-op pulmonary complications (composite including respiratory failure, respiratory infection, pleural effusion, atelectasis, pneumothorax, bronchospasm, aspiration pneumonitis)  Patient previously followed by Cardiology, last seen in 2021. Has hx of coronary calcifications on imaging with negative stress testing in 2020. Also with hx of aortic stenosis. Echo obtained while inpatient in July showed normal LVEF 65-70%, grade I DD, mild-mod AS with mean gradient 23 mmHg.  Patient established care with  Oncology. Seen on 12/19. CT chest/abd/pelvis ordered. No report available.  Of note patient's Hgb has significantly dropped. Hgb 12.3 in August and 8.5 when checked in Oncology clinic. He is having ongoing hematuria with clots. He was started on B12 and folate. [facility] made aware. Will add on CBC for DOS and T&S   VS: BP (!) 103/59   Pulse 72   Temp 36.6 C (Oral)   Ht 5' 10 (1.778 m)   Wt 52.6 kg   SpO2 100% Comment: 2L O2  BMI 16.64 kg/m   PROVIDERS: Corlis Pagan, NP   LABS: Results relayed to [facility] (all labs ordered are listed, but only abnormal results are displayed)  Labs Reviewed  CBC  BASIC METABOLIC PANEL WITH GFR     CT Chest 01/23/24:   IMPRESSION: Multiple pulmonary nodules measuring up to 7 mm concerning for metastasis. No new nodules. Follow-up according to oncologic protocols.   Atherosclerotic calcifications of aorta and coronary arteries.   Echo 11/22/23:  IMPRESSIONS    1. Left ventricular ejection fraction, by estimation, is 65 to 70%. The left ventricle has normal function. The left ventricle has no regional wall motion abnormalities. Left ventricular diastolic parameters are consistent with Grade I diastolic dysfunction (impaired relaxation).  2. Right ventricular systolic function is normal. The right ventricular size is normal. Tricuspid regurgitation signal is inadequate for assessing PA pressure.  3. The mitral valve is normal in structure. Trivial mitral valve regurgitation.  4. The aortic valve is tricuspid. There is moderate calcification of the aortic valve. Aortic valve regurgitation is mild. Mild to moderate aortic valve stenosis. Aortic valve area, by VTI measures 1.10 cm. Aortic valve mean gradient measures 23.0 mmHg. Aortic valve Vmax measures 2.98 m/s.  5. The inferior vena cava is dilated in size with <50% respiratory variability, suggesting right atrial pressure of 15 mmHg.   Lexiscan  Tetrofosmin  Stress Test  03/24/2019:  Nondiagnostic ECG stress. The LV is mildly dilated both in rest and stress images with LV end diastolic volume of 132 mL. Mild degree medium extent perfusion consistent with mild (reversible) ischemia located in the mid inferoseptal wall and basal inferoseptal wall (Right Coronary Artery region) of left ventricle. Stress LV EF: 41%.  Intermediate risk study.  No previous exam available for comparison. Past Medical History:  Diagnosis Date   Anemia    Anxiety disorder, unspecified    Arthritis    Asthma    as a child   Asymmetrical hearing loss of left ear 01/03/2018   Bilateral carotid artery stenosis 08/07/2015   Dr. Dorn Lesches- 04/01/15- Heterogenous plaque- 1-39% RICA stenosis.  Homogenous plaque- 40-59% LICA stenosis.  >50% LECA stenosis.  Normal subclavian arteries bilaterally.  Patent vertebral arteries with antegrade flow.   Complication of anesthesia    slow to wake up   Constipation 08/07/2015   COPD (chronic obstructive pulmonary disease) (HCC)    Coronary artery disease involving native coronary artery of native heart without angina pectoris 04/03/2019   Displacement of lumbar intervertebral disc without myelopathy 04/14/2013   Dyspnea    Essential (primary) hypertension 04/03/2019   Golfer's elbow, right 03/30/2016   Heart murmur    History of kidney removal    left kidney . due to cancer   History of kidney stones    Hyperlipidemia 08/07/2015   Hypothyroidism due to Hashimoto's thyroiditis 07/15/2015   Leg weakness 08/07/2015   Lumbar radiculopathy 10/21/2013   Nocturia    Nonrheumatic aortic valve insufficiency 02/22/2019   Nonrheumatic aortic valve stenosis 02/22/2019   Pneumonia    Spinal stenosis of lumbar region 05/19/2015   Transitional cell carcinoma of bladder (HCC) UROLOGIST--  DR CEIL   S/P TURBT X3--  HX BCG TX'S   Vitamin D deficiency     Past Surgical History:  Procedure Laterality Date   BRONCHIAL BIOPSY   01/09/2022   Procedure: BRONCHIAL BIOPSIES;  Surgeon: Gladis Leonor HERO, MD;  Location: [facility] ENDOSCOPY;  Service: Pulmonary;;   BRONCHIAL BRUSHINGS  01/09/2022   Procedure: BRONCHIAL BRUSHINGS;  Surgeon: Gladis Leonor HERO, MD;  Location: [facility] ENDOSCOPY;  Service: Pulmonary;;   BRONCHIAL NEEDLE ASPIRATION BIOPSY  01/09/2022   Procedure: BRONCHIAL NEEDLE ASPIRATION BIOPSIES;  Surgeon: Gladis Leonor HERO, MD;  Location: [facility] ENDOSCOPY;  Service: Pulmonary;;   CYSTOSCOPY  05/10/2022   Procedure: CYSTOSCOPY WITH STENT REMOVAL;  Surgeon: Alvaro Hummer, MD;  Location: [facility];  Service: Urology;;   PHYLLIS W/ RETROGRADES Bilateral 12/13/2012   Procedure: CYSTOSCOPY WITH RETROGRADE PYELOGRAM;  Surgeon: Mark C Ottelin, MD;  Location: [facility];  Service: Urology;  Laterality: Bilateral;   CYSTOSCOPY W/ RETROGRADES Right 04/27/2023   Procedure: CYSTOSCOPY WITH RIGHT RETROGRADE PYELOGRAM;  Surgeon: Alvaro Hummer KATHEE Mickey., MD;  Location: [facility];  Service: Urology;  Laterality: Right;  60 MINUTES NEEDED FOR CASE   CYSTOSCOPY WITH LITHOLAPAXY N/A 04/04/2013   Procedure: CYSTOSCOPY ;  Surgeon: Oneil JAYSON Ceil, MD;  Location: [facility];  Service: Urology;  Laterality: N/A;   CYSTOSCOPY WITH RETROGRADE PYELOGRAM, URETEROSCOPY AND STENT PLACEMENT Bilateral 02/28/2022   Procedure: CYSTOSCOPY WITH RETROGRADE PYELOGRAM, URETEROSCOPY, LEFT STENT PLACEMENT;  Surgeon: Rosalind Zachary KATHEE, MD;  Location: [facility];  Service: Urology;  Laterality: Bilateral;  1 HR  LUMBAR DISC SURGERY  09/20/2005   L2 -- L5   LUMBAR LAMINECTOMY/DECOMPRESSION MICRODISCECTOMY Left 01/09/2013   Procedure: Left  lumbar four-five microdiskectomy;  Surgeon: Rockey LITTIE Peru, MD;  Location: [facility];  Service: Neurosurgery;  Laterality: Left;   LUMBAR WOUND DEBRIDEMENT N/A 01/13/2013   Procedure: LUMBAR WOUND DEBRIDEMENT;  Surgeon: Victory DELENA Gunnels, MD;  Location: [facility];  Service: Neurosurgery;  Laterality: N/A;   Lumbar Wound Exploration,  Attempted Placement of Cerebrospinal Drain, and Repair of CSF Leak   ROBOT ASSITED LAPAROSCOPIC NEPHROURETERECTOMY Left 05/10/2022   Procedure: XI ROBOT ASSITED LAPAROSCOPIC NEPHROURETERECTOMY;  Surgeon: Alvaro Hummer, MD;  Location: [facility];  Service: Urology;  Laterality: Left;   TOOTH EXTRACTION     TRANSURETHRAL RESECTION OF BLADDER TUMOR  07-05-2009;   11-22-2009   bladder cancer   TRANSURETHRAL RESECTION OF BLADDER TUMOR N/A 12/13/2012   Procedure: TRANSURETHRAL RESECTION OF BLADDER TUMOR (TURBT) AND INSTILLATION OF MYTOMYCIN;  Surgeon: Mark C Ottelin, MD;  Location: [facility];  Service: Urology;  Laterality: N/A;   TRANSURETHRAL RESECTION OF BLADDER TUMOR N/A 04/27/2023   Procedure: TRANSURETHRAL RESECTION OF BLADDER TUMOR (TURBT);  Surgeon: Alvaro Hummer KATHEE Mickey., MD;  Location: [facility];  Service: Urology;  Laterality: N/A;   TRANSURETHRAL RESECTION OF BLADDER TUMOR WITH GYRUS (TURBT-GYRUS) N/A 04/04/2013   Procedure: TRANSURETHRAL RESECTION OF BLADDER TUMOR WITH GYRUS (TURBT-GYRUS);  Surgeon: Mark C Ottelin, MD;  Location: [facility];  Service: Urology;  Laterality: N/A;    MEDICATIONS:  albuterol  (VENTOLIN  HFA) 108 (90 Base) MCG/ACT inhaler   amLODipine  (NORVASC ) 2.5 MG tablet   Calcium-Magnesium -Zinc (CAL-MAG-ZINC PO)   Cholecalciferol (VITAMIN D3 PO)   Coenzyme Q10 (COQ10 PO)   Dextromethorphan-guaiFENesin  (MUCINEX  FAST-MAX DM MAX) 5-100 MG/5ML LIQD   ezetimibe (ZETIA) 10 MG tablet   Fluticasone -Umeclidin-Vilant (TRELEGY ELLIPTA ) 200-62.5-25 MCG/ACT AEPB   Homeopathic Products (RESTFUL LEGS SL)   HYDROcodone -acetaminophen  (NORCO) 10-325 MG tablet   levothyroxine  (SYNTHROID ) 100 MCG tablet   Omega-3 Fatty Acids (FISH OIL PO)   simvastatin (ZOCOR) 10 MG tablet   No current facility-administered medications for this encounter.          "

## 2024-05-05 NOTE — Progress Notes (Addendum)
 For Anesthesia: PCP - Corlis Pagan, NP  Elmira Penman, MD  Cardiologist -Elmira Penman, MD LOV:  2021 Pulmonologist: Kara Dorn NOVAK, MD LOV: 05/01/24 Bowel Prep reminder:N/A  Chest x-ray - 11/22/23 CT chest: 01/31/24 EKG - 11/22/23 Stress Test -  ECHO - 11/22/23 Cardiac Cath -  Pacemaker/ICD device last checked: Pacemaker orders received: Device Rep notified:  Spinal Cord Stimulator:N/A  Sleep Study -N/A  CPAP -   Fasting Blood Sugar -  N/A Checks Blood Sugar _____ times a day Date and result of last Hgb A1c-  Last dose of GLP1 agonist-  GLP1 instructions: Hold 7 days prior to schedule (Hold 24 hours-daily)   Last dose of SGLT-2 inhibitors-  SGLT-2 instructions: Hold 72 hours prior to surgery  Blood Thinner Instructions: Last Dose: Time last taken:  Aspirin Instructions: Last Dose: Time last taken:  Activity level: Unable to go up a flight of stairs without shortness of breath     Anesthesia review: Hx: CAD, murmur, COPD, mild aortic stenosis, HTN.,smoker. 2L O2 continuous. Hemoglobin: 8.5: 05/02/24  Patient denies shortness of breath, fever, cough and chest pain at PAT appointment   Patient verbalized understanding of instructions that were reviewed over the telephone.

## 2024-05-05 NOTE — Anesthesia Preprocedure Evaluation (Addendum)
 "                                  Anesthesia Evaluation  Patient identified by MRN, date of birth, ID band Patient awake    Reviewed: Allergy & Precautions, NPO status , Patient's Chart, lab work & pertinent test results  History of Anesthesia Complications (+) DIFFICULT AIRWAY and history of anesthetic complications  Airway Mallampati: III  TM Distance: >3 FB Neck ROM: Full    Dental no notable dental hx. (+) Teeth Intact, Dental Advisory Given   Pulmonary asthma , COPD,  COPD inhaler, Current Smoker and Patient abstained from smoking.   Pulmonary exam normal breath sounds clear to auscultation       Cardiovascular hypertension, Pt. on medications (-) angina (-) Past MI Normal cardiovascular exam+ Valvular Problems/Murmurs (moderate AS) AS  Rhythm:Regular Rate:Normal  11/2023 TTE  1. Left ventricular ejection fraction, by estimation, is 65 to 70%. The  left ventricle has normal function. The left ventricle has no regional  wall motion abnormalities. Left ventricular diastolic parameters are  consistent with Grade I diastolic  dysfunction (impaired relaxation).   2. Right ventricular systolic function is normal. The right ventricular  size is normal. Tricuspid regurgitation signal is inadequate for assessing  PA pressure.   3. The mitral valve is normal in structure. Trivial mitral valve  regurgitation.   4. The aortic valve is tricuspid. There is moderate calcification of the  aortic valve. Aortic valve regurgitation is mild. Mild to moderate aortic  valve stenosis. Aortic valve area, by VTI measures 1.10 cm. Aortic valve  mean gradient measures 23.0  mmHg. Aortic valve Vmax measures 2.98 m/s.   5. The inferior vena cava is dilated in size with <50% respiratory  variability, suggesting right atrial pressure of 15 mmHg.      Neuro/Psych   Anxiety      Neuromuscular disease    GI/Hepatic   Endo/Other  Hypothyroidism    Renal/GU Renal diseaseLab  Results      Component                Value               Date                      NA                       141                 05/14/2024                CL                       105                 05/14/2024                K                        4.6                 05/14/2024                CO2  26                  05/14/2024                BUN                      26 (H)              05/14/2024                CREATININE               1.68 (H)            05/14/2024                GFRNONAA                 44 (L)              05/14/2024                CALCIUM                  9.1                 05/14/2024                PHOS                     2.7                 11/26/2023                ALBUMIN                  4.3                 05/02/2024                GLUCOSE                  144 (H)             05/14/2024                      Musculoskeletal  (+) Arthritis ,    Abdominal   Peds  Hematology  (+) Blood dyscrasia, anemia Lab Results      Component                Value               Date                      WBC                      7.6                 05/14/2024                HGB                      4.6 (LL)            05/14/2024                HCT                      15.2 (L)  05/14/2024                MCV                      99.3                05/14/2024                PLT                      250                 05/14/2024            Pt to receive 2 Units PRBC   Anesthesia Other Findings All: crestor sulfa  Reproductive/Obstetrics                              Anesthesia Physical Anesthesia Plan  ASA: 4  Anesthesia Plan: General   Post-op Pain Management: Precedex  and Ofirmev  IV (intra-op)*   Induction: Intravenous  PONV Risk Score and Plan: 3 and Treatment may vary due to age or medical condition, Ondansetron , Propofol  infusion and TIVA  Airway Management Planned: LMA  Additional Equipment:  None  Intra-op Plan:   Post-operative Plan: Extubation in OR  Informed Consent: I have reviewed the patients History and Physical, chart, labs and discussed the procedure including the risks, benefits and alternatives for the proposed anesthesia with the patient or authorized representative who has indicated his/her understanding and acceptance.     Dental advisory given  Plan Discussed with: CRNA and Surgeon  Anesthesia Plan Comments: (See PAT note from 12/22)         Anesthesia Quick Evaluation  "

## 2024-05-06 ENCOUNTER — Telehealth: Payer: Self-pay

## 2024-05-06 NOTE — Telephone Encounter (Signed)
 Requested paperwork faxed to [employer]

## 2024-05-12 NOTE — Telephone Encounter (Signed)
 Copy of this encounter faxed to [facility].

## 2024-05-14 ENCOUNTER — Ambulatory Visit (HOSPITAL_COMMUNITY): Payer: Self-pay | Admitting: Medical

## 2024-05-14 ENCOUNTER — Encounter (HOSPITAL_COMMUNITY)
Admission: RE | Disposition: A | Discharge status: Home / Self Care | Payer: Self-pay | Source: Ambulatory Visit | Attending: Urology

## 2024-05-14 ENCOUNTER — Ambulatory Visit (HOSPITAL_COMMUNITY)

## 2024-05-14 ENCOUNTER — Encounter (HOSPITAL_COMMUNITY): Payer: Self-pay | Admitting: Urology

## 2024-05-14 ENCOUNTER — Other Ambulatory Visit: Payer: Self-pay

## 2024-05-14 ENCOUNTER — Ambulatory Visit (HOSPITAL_COMMUNITY): Admitting: Medical

## 2024-05-14 ENCOUNTER — Ambulatory Visit (HOSPITAL_COMMUNITY)
Admission: RE | Admit: 2024-05-14 | Discharge: 2024-05-14 | Disposition: A | Discharge status: Home / Self Care | Source: Ambulatory Visit | Attending: Urology | Admitting: Urology

## 2024-05-14 DIAGNOSIS — C652 Malignant neoplasm of left renal pelvis: Secondary | ICD-10-CM | POA: Diagnosis not present

## 2024-05-14 DIAGNOSIS — R627 Adult failure to thrive: Secondary | ICD-10-CM | POA: Insufficient documentation

## 2024-05-14 DIAGNOSIS — D649 Anemia, unspecified: Secondary | ICD-10-CM | POA: Diagnosis not present

## 2024-05-14 DIAGNOSIS — J4489 Other specified chronic obstructive pulmonary disease: Secondary | ICD-10-CM | POA: Insufficient documentation

## 2024-05-14 DIAGNOSIS — I10 Essential (primary) hypertension: Secondary | ICD-10-CM

## 2024-05-14 DIAGNOSIS — C679 Malignant neoplasm of bladder, unspecified: Secondary | ICD-10-CM | POA: Insufficient documentation

## 2024-05-14 DIAGNOSIS — C678 Malignant neoplasm of overlapping sites of bladder: Secondary | ICD-10-CM | POA: Diagnosis present

## 2024-05-14 DIAGNOSIS — G8929 Other chronic pain: Secondary | ICD-10-CM | POA: Insufficient documentation

## 2024-05-14 HISTORY — PX: TRANSURETHRAL RESECTION OF BLADDER TUMOR: SHX2575

## 2024-05-14 LAB — HEMOGLOBIN AND HEMATOCRIT, BLOOD
HCT: 21.7 % — ABNORMAL LOW (ref 39.0–52.0)
Hemoglobin: 7.1 g/dL — ABNORMAL LOW (ref 13.0–17.0)

## 2024-05-14 LAB — CBC
HCT: 15.2 % — ABNORMAL LOW (ref 39.0–52.0)
Hemoglobin: 4.6 g/dL — CL (ref 13.0–17.0)
MCH: 30.1 pg (ref 26.0–34.0)
MCHC: 30.3 g/dL (ref 30.0–36.0)
MCV: 99.3 fL (ref 80.0–100.0)
Platelets: 250 K/uL (ref 150–400)
RBC: 1.53 MIL/uL — ABNORMAL LOW (ref 4.22–5.81)
RDW: 15.6 % — ABNORMAL HIGH (ref 11.5–15.5)
WBC: 7.6 K/uL (ref 4.0–10.5)
nRBC: 0 % (ref 0.0–0.2)

## 2024-05-14 LAB — BASIC METABOLIC PANEL WITH GFR
Anion gap: 10 (ref 5–15)
BUN: 26 mg/dL — ABNORMAL HIGH (ref 8–23)
CO2: 26 mmol/L (ref 22–32)
Calcium: 9.1 mg/dL (ref 8.9–10.3)
Chloride: 105 mmol/L (ref 98–111)
Creatinine, Ser: 1.68 mg/dL — ABNORMAL HIGH (ref 0.61–1.24)
GFR, Estimated: 44 mL/min — ABNORMAL LOW
Glucose, Bld: 144 mg/dL — ABNORMAL HIGH (ref 70–99)
Potassium: 4.6 mmol/L (ref 3.5–5.1)
Sodium: 141 mmol/L (ref 135–145)

## 2024-05-14 LAB — PREPARE RBC (CROSSMATCH)

## 2024-05-14 LAB — ABO/RH: ABO/RH(D): O POS

## 2024-05-14 SURGERY — TURBT (TRANSURETHRAL RESECTION OF BLADDER TUMOR)
Anesthesia: General | Site: Bladder | Laterality: Right

## 2024-05-14 MED ORDER — LACTATED RINGERS IV SOLN: INTRAVENOUS | Status: DC

## 2024-05-14 MED ORDER — HYDROMORPHONE HCL 1 MG/ML IJ SOLN
INTRAMUSCULAR | Status: AC
  Filled 2024-05-14: qty 1

## 2024-05-14 MED ORDER — SODIUM CHLORIDE 0.9 % IR SOLN
Status: DC | PRN
  Administered 2024-05-14 (×4): 3000 mL via INTRAVESICAL

## 2024-05-14 MED ORDER — 0.9 % SODIUM CHLORIDE (POUR BTL) OPTIME
TOPICAL | Status: DC | PRN
  Administered 2024-05-14: 1000 mL

## 2024-05-14 MED ORDER — PROPOFOL 10 MG/ML IV BOLUS
INTRAVENOUS | Status: AC
  Filled 2024-05-14: qty 20

## 2024-05-14 MED ORDER — ACETAMINOPHEN 325 MG PO TABS
650.0000 mg | ORAL_TABLET | Freq: Once | ORAL | Status: AC
  Administered 2024-05-14: 650 mg via ORAL

## 2024-05-14 MED ORDER — PHENYLEPHRINE 80 MCG/ML (10ML) SYRINGE FOR IV PUSH (FOR BLOOD PRESSURE SUPPORT)
PREFILLED_SYRINGE | INTRAVENOUS | Status: AC
  Filled 2024-05-14: qty 10

## 2024-05-14 MED ORDER — GENTAMICIN SULFATE 40 MG/ML IJ SOLN
5.0000 mg/kg | INTRAVENOUS | Status: AC
  Administered 2024-05-14: 258.4 mg via INTRAVENOUS
  Filled 2024-05-14: qty 6.5

## 2024-05-14 MED ORDER — HYDROMORPHONE HCL 1 MG/ML IJ SOLN
0.2500 mg | INTRAMUSCULAR | Status: DC | PRN
  Administered 2024-05-14 (×2): 0.5 mg via INTRAVENOUS

## 2024-05-14 MED ORDER — ONDANSETRON HCL 4 MG/2ML IJ SOLN: 4.0000 mg | Freq: Once | INTRAMUSCULAR | Status: DC | PRN

## 2024-05-14 MED ORDER — SODIUM CHLORIDE 0.9 % IV SOLN: 10.0000 mL/h | Freq: Once | INTRAVENOUS | Status: DC

## 2024-05-14 MED ORDER — ACETAMINOPHEN 10 MG/ML IV SOLN: 1000.0000 mg | Freq: Once | INTRAVENOUS | Status: DC | PRN

## 2024-05-14 MED ORDER — ONDANSETRON HCL 4 MG/2ML IJ SOLN
INTRAMUSCULAR | Status: DC | PRN
  Administered 2024-05-14: 4 mg via INTRAVENOUS

## 2024-05-14 MED ORDER — FENTANYL CITRATE (PF) 100 MCG/2ML IJ SOLN
INTRAMUSCULAR | Status: AC
  Filled 2024-05-14: qty 2

## 2024-05-14 MED ORDER — LIDOCAINE HCL (PF) 2 % IJ SOLN
INTRAMUSCULAR | Status: AC
  Filled 2024-05-14: qty 5

## 2024-05-14 MED ORDER — OXYCODONE HCL 5 MG/5ML PO SOLN: 5.0000 mg | Freq: Once | ORAL | Status: DC | PRN

## 2024-05-14 MED ORDER — CHLORHEXIDINE GLUCONATE 0.12 % MT SOLN
15.0000 mL | Freq: Once | OROMUCOSAL | Status: AC
  Administered 2024-05-14: 15 mL via OROMUCOSAL

## 2024-05-14 MED ORDER — MIDAZOLAM HCL 2 MG/2ML IJ SOLN
INTRAMUSCULAR | Status: AC
  Filled 2024-05-14: qty 2

## 2024-05-14 MED ORDER — PROPOFOL 10 MG/ML IV BOLUS
INTRAVENOUS | Status: DC | PRN
  Administered 2024-05-14: 100 mg via INTRAVENOUS

## 2024-05-14 MED ORDER — ORAL CARE MOUTH RINSE: 15.0000 mL | Freq: Once | OROMUCOSAL | Status: AC

## 2024-05-14 MED ORDER — IOHEXOL 300 MG/ML  SOLN
INTRAMUSCULAR | Status: DC | PRN
  Administered 2024-05-14: 10 mL via URETHRAL

## 2024-05-14 MED ORDER — PHENYLEPHRINE 80 MCG/ML (10ML) SYRINGE FOR IV PUSH (FOR BLOOD PRESSURE SUPPORT)
PREFILLED_SYRINGE | INTRAVENOUS | Status: DC | PRN
  Administered 2024-05-14 (×4): 160 ug via INTRAVENOUS

## 2024-05-14 MED ORDER — SENNOSIDES-DOCUSATE SODIUM 8.6-50 MG PO TABS: 1.0000 | ORAL_TABLET | Freq: Two times a day (BID) | ORAL | 3 refills | Status: AC

## 2024-05-14 MED ORDER — ACETAMINOPHEN 10 MG/ML IV SOLN
INTRAVENOUS | Status: AC
  Filled 2024-05-14: qty 100

## 2024-05-14 MED ORDER — FENTANYL CITRATE (PF) 100 MCG/2ML IJ SOLN
INTRAMUSCULAR | Status: DC | PRN
  Administered 2024-05-14: 50 ug via INTRAVENOUS

## 2024-05-14 MED ORDER — EPHEDRINE SULFATE (PRESSORS) 25 MG/5ML IV SOSY
PREFILLED_SYRINGE | INTRAVENOUS | Status: DC | PRN
  Administered 2024-05-14 (×2): 10 mg via INTRAVENOUS

## 2024-05-14 MED ORDER — LIDOCAINE HCL (CARDIAC) PF 100 MG/5ML IV SOSY
PREFILLED_SYRINGE | INTRAVENOUS | Status: DC | PRN
  Administered 2024-05-14: 60 mg via INTRAVENOUS

## 2024-05-14 MED ORDER — ACETAMINOPHEN 325 MG PO TABS
ORAL_TABLET | ORAL | Status: AC
  Filled 2024-05-14: qty 2

## 2024-05-14 MED ORDER — SODIUM CHLORIDE 0.9% IV SOLUTION: Freq: Once | INTRAVENOUS | Status: AC

## 2024-05-14 MED ORDER — ONDANSETRON HCL 4 MG/2ML IJ SOLN
INTRAMUSCULAR | Status: AC
  Filled 2024-05-14: qty 2

## 2024-05-14 MED ORDER — OXYCODONE HCL 5 MG PO TABS: 5.0000 mg | ORAL_TABLET | Freq: Once | ORAL | Status: DC | PRN

## 2024-05-14 MED ORDER — DEXAMETHASONE SOD PHOSPHATE PF 10 MG/ML IJ SOLN
INTRAMUSCULAR | Status: DC | PRN
  Administered 2024-05-14: 8 mg via INTRAVENOUS

## 2024-05-14 MED ORDER — OXYCODONE HCL 5 MG PO TABS: 5.0000 mg | ORAL_TABLET | Freq: Four times a day (QID) | ORAL | 0 refills | Status: AC | PRN

## 2024-05-14 SURGICAL SUPPLY — 17 items
BAG URINE DRAIN 2000ML AR STRL (UROLOGICAL SUPPLIES) IMPLANT
BAG URO CATCHER STRL LF (MISCELLANEOUS) ×2 IMPLANT
CATH URETL OPEN END 6FR 70 (CATHETERS) IMPLANT
CLOTH BEACON ORANGE TIMEOUT ST (SAFETY) ×2 IMPLANT
DRAPE FOOT SWITCH (DRAPES) ×2 IMPLANT
ELECT REM PT RETURN 15FT ADLT (MISCELLANEOUS) ×2 IMPLANT
GLOVE SURG LX STRL 7.5 STRW (GLOVE) ×2 IMPLANT
GOWN STRL REUS W/ TWL XL LVL3 (GOWN DISPOSABLE) ×2 IMPLANT
GUIDEWIRE STR DUAL SENSOR (WIRE) ×2 IMPLANT
KIT TURNOVER KIT A (KITS) ×2 IMPLANT
LOOP CUT BIPOLAR 24F LRG (ELECTROSURGICAL) IMPLANT
MANIFOLD NEPTUNE II (INSTRUMENTS) ×2 IMPLANT
NS IRRIG 1000ML POUR BTL (IV SOLUTION) IMPLANT
PACK CYSTO (CUSTOM PROCEDURE TRAY) ×2 IMPLANT
SYRINGE TOOMEY IRRIG 70ML (MISCELLANEOUS) IMPLANT
TUBING CONNECTING 10 (TUBING) ×2 IMPLANT
TUBING UROLOGY SET (TUBING) ×2 IMPLANT

## 2024-05-14 NOTE — Anesthesia Procedure Notes (Signed)
 Procedure Name: LMA Insertion Date/Time: 05/14/2024 4:08 PM  Performed by: Nada Corean CROME, CRNAPre-anesthesia Checklist: Emergency Drugs available, Patient identified, Suction available, Patient being monitored and Timeout performed Patient Re-evaluated:Patient Re-evaluated prior to induction Oxygen Delivery Method: Circle system utilized Preoxygenation: Pre-oxygenation with 100% oxygen Induction Type: IV induction Ventilation: Mask ventilation without difficulty LMA: LMA inserted LMA Size: 4.0 Number of attempts: 1 Placement Confirmation: positive ETCO2 and breath sounds checked- equal and bilateral Tube secured with: Tape Dental Injury: Teeth and Oropharynx as per pre-operative assessment

## 2024-05-14 NOTE — Transfer of Care (Signed)
 Immediate Anesthesia Transfer of Care Note  Patient: Harry Young  Procedure(s) Performed: TURBT (TRANSURETHRAL RESECTION OF BLADDER TUMOR) AND CLOT EVACUATION (Right: Bladder)  Patient Location: PACU  Anesthesia Type:General  Level of Consciousness: drowsy and patient cooperative  Airway & Oxygen Therapy: Patient Spontanous Breathing and Patient connected to face mask oxygen  Post-op Assessment: Report given to RN and Post -op Vital signs reviewed and stable  Post vital signs: Reviewed and stable  Last Vitals:  Vitals Value Taken Time  BP 127/60 05/14/24 17:15  Temp 36.4 C 05/14/24 17:15  Pulse 65 05/14/24 17:16  Resp 7 05/14/24 17:16  SpO2 100 % 05/14/24 17:16  Vitals shown include unfiled device data.  Last Pain:  Vitals:   05/14/24 1553  TempSrc: Oral  PainSc:          Complications: No notable events documented.

## 2024-05-14 NOTE — Brief Op Note (Signed)
 05/14/2024  4:53 PM  PATIENT:  Harry Young  68 y.o. male  PRE-OPERATIVE DIAGNOSIS:  RECURRENT BLADDER CANCER  POST-OPERATIVE DIAGNOSIS:  RECURRENT BLADDER CANCER  PROCEDURE:  Procedures: TURBT (TRANSURETHRAL RESECTION OF BLADDER TUMOR) AND CLOT EVACUATION (Right)  SURGEON:  Surgeons and Role:    * Manny, Ricardo KATHEE Raddle., MD - Primary  PHYSICIAN ASSISTANT:   ASSISTANTS: none   ANESTHESIA:   general  EBL: old clot   BLOOD ADMINISTERED:2 units  CC PRBC  DRAINS: none   LOCAL MEDICATIONS USED:  NONE  SPECIMEN:  Source of Specimen:  bladder tumor  DISPOSITION OF SPECIMEN:  PATHOLOGY  COUNTS:  YES  TOURNIQUET:  * No tourniquets in log *  DICTATION: .Other Dictation: Dictation Number [id number]  PLAN OF CARE: Discharge to home after PACU  PATIENT DISPOSITION:  PACU - hemodynamically stable.   Delay start of Pharmacological VTE agent (>24hrs) due to surgical blood loss or risk of bleeding: yes

## 2024-05-14 NOTE — Discharge Instructions (Signed)
1 - You may have urinary urgency (bladder spasms) and bloody urine on / off for up to 2 weeks.  This is normal.  2 - Call MD or go to ER for fever >102, severe pain / nausea / vomiting not relieved by medications, or acute change in medical status  

## 2024-05-14 NOTE — H&P (Signed)
 Harry Young is an 68 y.o. male.    Chief Complaint: Pre-OP Transurethral Resection of Bladder Tumor / RIGHT Retrograde  HPI:   1 - Bladder Cancer - T1G3 in 2011 s/p BCG induction x 3. Refused cystectomy.  Recent surveilance: 01/2022 - CT - left renal pelvis cancer, cysto with no bladder lesions ==> Left Neph U 03/2023- cysto - multifocal papillary bladder tumors abtou 2cm x3-4 (bladder neck, dome, lateral) ==> TaG3 with negative muscle at TURBT 04/2023 07/2023 - cysto very very small papillary tumor x 3 (3mm each); 11/2023 - cysto abtou 4cm total papillary tumor (1cm x 4)  2 - Left Renal Pelvis Cancer - s/p LEFT nephro-ureterectomy 04/2022 for large volume TaG1 tumor not amenable to endocopic management. Margins negative.  PMH sig for chronic back pain / narcotics, COPD (worsening, recurrent admissions). His wife [name] is very involved. Retired from [employer]. / His PCP is Izetta Cork NP with [facility].   Today  Harry Young  is seen to proceed with TURBT for recurrent bladder cancer with ongoing bleeding. His pulm function is overall trending worse but currently at baseline. Has pulm clearance but is clearly very high risk. CT recently with likely progression of bladder cancer and / or large clot in bladder. No hydro. Hgb 8.5, Cr 1.4 most recnetly.    Past Medical History:  Diagnosis Date   Anemia    Anxiety disorder, unspecified    Arthritis    Asthma    as a child   Asymmetrical hearing loss of left ear 01/03/2018   Bilateral carotid artery stenosis 08/07/2015   Dr. Dorn Lesches- 04/01/15- Heterogenous plaque- 1-39% RICA stenosis.  Homogenous plaque- 40-59% LICA stenosis.  >50% LECA stenosis.  Normal subclavian arteries bilaterally.  Patent vertebral arteries with antegrade flow.   Complication of anesthesia    slow to wake up   Constipation 08/07/2015   COPD (chronic obstructive pulmonary disease) (HCC)    Coronary artery disease involving native coronary artery of  native heart without angina pectoris 04/03/2019   Displacement of lumbar intervertebral disc without myelopathy 04/14/2013   Dyspnea    Essential (primary) hypertension 04/03/2019   Golfer's elbow, right 03/30/2016   Heart murmur    History of kidney removal    left kidney . due to cancer   History of kidney stones    Hyperlipidemia 08/07/2015   Hypothyroidism due to Hashimoto's thyroiditis 07/15/2015   Leg weakness 08/07/2015   Lumbar radiculopathy 10/21/2013   Nocturia    Nonrheumatic aortic valve insufficiency 02/22/2019   Nonrheumatic aortic valve stenosis 02/22/2019   Pneumonia    Spinal stenosis of lumbar region 05/19/2015   Transitional cell carcinoma of bladder (HCC) UROLOGIST--  DR CEIL   S/P TURBT X3--  HX BCG TX'S   Vitamin D deficiency     Past Surgical History:  Procedure Laterality Date   BRONCHIAL BIOPSY  01/09/2022   Procedure: BRONCHIAL BIOPSIES;  Surgeon: Gladis Leonor HERO, MD;  Location: [facility] ENDOSCOPY;  Service: Pulmonary;;   BRONCHIAL BRUSHINGS  01/09/2022   Procedure: BRONCHIAL BRUSHINGS;  Surgeon: Gladis Leonor HERO, MD;  Location: [facility] ENDOSCOPY;  Service: Pulmonary;;   BRONCHIAL NEEDLE ASPIRATION BIOPSY  01/09/2022   Procedure: BRONCHIAL NEEDLE ASPIRATION BIOPSIES;  Surgeon: Gladis Leonor HERO, MD;  Location: [facility] ENDOSCOPY;  Service: Pulmonary;;   CYSTOSCOPY  05/10/2022   Procedure: CYSTOSCOPY WITH STENT REMOVAL;  Surgeon: Alvaro Hummer, MD;  Location: [facility] ORS;  Service: Urology;;   CYSTOSCOPY W/ RETROGRADES Bilateral 12/13/2012   Procedure: PHYLLIS  WITH RETROGRADE PYELOGRAM;  Surgeon: Mark C Ottelin, MD;  Location: [facility];  Service: Urology;  Laterality: Bilateral;   CYSTOSCOPY W/ RETROGRADES Right 04/27/2023   Procedure: CYSTOSCOPY WITH RIGHT RETROGRADE PYELOGRAM;  Surgeon: Alvaro Ricardo KATHEE Mickey., MD;  Location: [facility];  Service: Urology;  Laterality: Right;  60 MINUTES NEEDED FOR CASE   CYSTOSCOPY WITH LITHOLAPAXY N/A 04/04/2013    Procedure: CYSTOSCOPY ;  Surgeon: Oneil JAYSON Rafter, MD;  Location: [facility];  Service: Urology;  Laterality: N/A;   CYSTOSCOPY WITH RETROGRADE PYELOGRAM, URETEROSCOPY AND STENT PLACEMENT Bilateral 02/28/2022   Procedure: CYSTOSCOPY WITH RETROGRADE PYELOGRAM, URETEROSCOPY, LEFT STENT PLACEMENT;  Surgeon: Rosalind Zachary KATHEE, MD;  Location: [facility];  Service: Urology;  Laterality: Bilateral;  1 HR   LUMBAR DISC SURGERY  09/20/2005   L2 -- L5   LUMBAR LAMINECTOMY/DECOMPRESSION MICRODISCECTOMY Left 01/09/2013   Procedure: Left  lumbar four-five microdiskectomy;  Surgeon: Rockey LITTIE Peru, MD;  Location: [facility];  Service: Neurosurgery;  Laterality: Left;   LUMBAR WOUND DEBRIDEMENT N/A 01/13/2013   Procedure: LUMBAR WOUND DEBRIDEMENT;  Surgeon: Victory DELENA Gunnels, MD;  Location: [facility];  Service: Neurosurgery;  Laterality: N/A;  Lumbar Wound Exploration,  Attempted Placement of Cerebrospinal Drain, and Repair of CSF Leak   ROBOT ASSITED LAPAROSCOPIC NEPHROURETERECTOMY Left 05/10/2022   Procedure: XI ROBOT ASSITED LAPAROSCOPIC NEPHROURETERECTOMY;  Surgeon: Alvaro Ricardo, MD;  Location: [facility];  Service: Urology;  Laterality: Left;   TOOTH EXTRACTION     TRANSURETHRAL RESECTION OF BLADDER TUMOR  07-05-2009;   11-22-2009   bladder cancer   TRANSURETHRAL RESECTION OF BLADDER TUMOR N/A 12/13/2012   Procedure: TRANSURETHRAL RESECTION OF BLADDER TUMOR (TURBT) AND INSTILLATION OF MYTOMYCIN;  Surgeon: Mark C Ottelin, MD;  Location: [facility];  Service: Urology;  Laterality: N/A;   TRANSURETHRAL RESECTION OF BLADDER TUMOR N/A 04/27/2023   Procedure: TRANSURETHRAL RESECTION OF BLADDER TUMOR (TURBT);  Surgeon: Alvaro Ricardo KATHEE Mickey., MD;  Location: [facility];  Service: Urology;  Laterality: N/A;   TRANSURETHRAL RESECTION OF BLADDER TUMOR WITH GYRUS (TURBT-GYRUS) N/A 04/04/2013   Procedure: TRANSURETHRAL RESECTION OF BLADDER TUMOR WITH GYRUS (TURBT-GYRUS);  Surgeon: Mark C Ottelin,  MD;  Location: [facility];  Service: Urology;  Laterality: N/A;    Family History  Problem Relation Age of Onset   Hyperlipidemia Mother    Heart disease Father    Heart failure Father    COPD Father    Diabetes Brother    Social History:  reports that he has been smoking cigarettes. He started smoking about 55 years ago. He has a 55.9 pack-year smoking history. He has never used smokeless tobacco. He reports that he does not drink alcohol and does not use drugs.  Allergies: Allergies[1]  No medications prior to admission.    No results found for this or any previous visit (from the past 48 hours). No results found.  Review of Systems  Constitutional:  Negative for chills and fever.  Genitourinary:  Positive for hematuria.  All other systems reviewed and are negative.   There were no vitals taken for this visit. Physical Exam Vitals reviewed.  HENT:     Head: Normocephalic.     Nose: Nose normal.  Eyes:     Pupils: Pupils are equal, round, and reactive to light.  Cardiovascular:     Rate and Rhythm: Normal rate.  Pulmonary:     Comments: Stable baseline increase WOB Genitourinary:    Comments:  No CVAT at present Musculoskeletal:        General: Normal range of motion.     Cervical back: Normal range of motion.  Skin:    General: Skin is warm.  Neurological:     General: No focal deficit present.     Mental Status: He is alert.  Psychiatric:        Mood and Affect: Mood normal.      Assessment/Plan  Proceed as planned with TURBT / Rt retrograde for recurrent bladder cancer with large volume bleeding. RIsks, benefits, alternatives, expected peri-op course discussed previously and reiterated today. He understands that his major pulm comorbidity increases risk of ALL peri-opp complicaitons including pulm failure / mortality tremendously.   Ricardo KATHEE Alvaro Mickey., MD 05/14/2024, 6:46 AM       [1]  Allergies Allergen Reactions   Crestor  [Rosuvastatin] Other (See Comments)    Urinary retention.   Sulfa Antibiotics Rash

## 2024-05-14 NOTE — Anesthesia Postprocedure Evaluation (Signed)
"   Anesthesia Post Note  Patient: Harry Young  Procedure(s) Performed: TURBT (TRANSURETHRAL RESECTION OF BLADDER TUMOR) AND CLOT EVACUATION (Right: Bladder)     Patient location during evaluation: PACU Anesthesia Type: General Level of consciousness: awake and alert Pain management: pain level controlled Vital Signs Assessment: post-procedure vital signs reviewed and stable Respiratory status: spontaneous breathing, nonlabored ventilation, respiratory function stable and patient connected to nasal cannula oxygen Cardiovascular status: blood pressure returned to baseline and stable Postop Assessment: no apparent nausea or vomiting Anesthetic complications: no   No notable events documented.  Last Vitals:  Vitals:   05/14/24 1731 05/14/24 1815  BP:  123/65  Pulse:  69  Resp: 12 14  Temp:  36.6 C  SpO2:  100%    Last Pain:  Vitals:   05/14/24 1815  TempSrc: Oral  PainSc: 0-No pain                 Garnette LABOR Harry Young      "

## 2024-05-14 NOTE — Progress Notes (Signed)
 CRITICAL RESULT PROVIDER NOTIFICATION  Test performed and critical result:  Hgb 4.6  Date and time result received:  05/14/24 1305  Provider name/title: Dr. Jefm  Date and time provider notified: 05/14/24 1305  Date and time provider responded: 05/13/24 1305  Provider response:No new orders

## 2024-05-14 NOTE — Progress Notes (Signed)
 Patient recv blood transfusion d/t critical Hgb result. At 15 mins post initiation of transfusion patient noted to have a 1 degree temp increase. Dr. Jefm to the bedside. Patient has no complaints of suspected transfusion. Dr. Jefm ordered 650 of tylenol . Given and vitals reassessed. 97.8 degrees at recehck. Vital signs remain at patient's baseline. Patient continues to be monitored.

## 2024-05-15 ENCOUNTER — Encounter (HOSPITAL_COMMUNITY): Payer: Self-pay | Admitting: Urology

## 2024-05-15 LAB — BPAM RBC
Blood Product Expiration Date: 202601252359
Blood Product Expiration Date: 202601252359
ISSUE DATE / TIME: 202512311331
ISSUE DATE / TIME: 202512311331
Unit Type and Rh: 5100
Unit Type and Rh: 5100

## 2024-05-15 LAB — TYPE AND SCREEN
ABO/RH(D): O POS
Antibody Screen: NEGATIVE
Unit division: 0
Unit division: 0

## 2024-05-17 ENCOUNTER — Emergency Department (HOSPITAL_COMMUNITY)

## 2024-05-17 ENCOUNTER — Encounter (HOSPITAL_COMMUNITY): Payer: Self-pay | Admitting: *Deleted

## 2024-05-17 ENCOUNTER — Emergency Department (HOSPITAL_COMMUNITY)
Admission: EM | Admit: 2024-05-17 | Discharge: 2024-05-17 | Disposition: A | Discharge status: Home / Self Care | Attending: Emergency Medicine | Admitting: Emergency Medicine

## 2024-05-17 ENCOUNTER — Other Ambulatory Visit: Payer: Self-pay

## 2024-05-17 DIAGNOSIS — Z9981 Dependence on supplemental oxygen: Secondary | ICD-10-CM | POA: Insufficient documentation

## 2024-05-17 DIAGNOSIS — R0602 Shortness of breath: Secondary | ICD-10-CM | POA: Diagnosis present

## 2024-05-17 DIAGNOSIS — R600 Localized edema: Secondary | ICD-10-CM | POA: Insufficient documentation

## 2024-05-17 DIAGNOSIS — J069 Acute upper respiratory infection, unspecified: Secondary | ICD-10-CM | POA: Diagnosis not present

## 2024-05-17 DIAGNOSIS — I10 Essential (primary) hypertension: Secondary | ICD-10-CM | POA: Insufficient documentation

## 2024-05-17 DIAGNOSIS — Z8551 Personal history of malignant neoplasm of bladder: Secondary | ICD-10-CM | POA: Diagnosis not present

## 2024-05-17 DIAGNOSIS — R7989 Other specified abnormal findings of blood chemistry: Secondary | ICD-10-CM | POA: Insufficient documentation

## 2024-05-17 DIAGNOSIS — Z79899 Other long term (current) drug therapy: Secondary | ICD-10-CM | POA: Insufficient documentation

## 2024-05-17 DIAGNOSIS — J441 Chronic obstructive pulmonary disease with (acute) exacerbation: Secondary | ICD-10-CM | POA: Insufficient documentation

## 2024-05-17 LAB — CBC
HCT: 26.2 % — ABNORMAL LOW (ref 39.0–52.0)
Hemoglobin: 7.8 g/dL — ABNORMAL LOW (ref 13.0–17.0)
MCH: 27.9 pg (ref 26.0–34.0)
MCHC: 29.8 g/dL — ABNORMAL LOW (ref 30.0–36.0)
MCV: 93.6 fL (ref 80.0–100.0)
Platelets: 167 K/uL (ref 150–400)
RBC: 2.8 MIL/uL — ABNORMAL LOW (ref 4.22–5.81)
RDW: 18.1 % — ABNORMAL HIGH (ref 11.5–15.5)
WBC: 6.2 K/uL (ref 4.0–10.5)
nRBC: 0 % (ref 0.0–0.2)

## 2024-05-17 LAB — RESP PANEL BY RT-PCR (RSV, FLU A&B, COVID)  RVPGX2
Influenza A by PCR: NEGATIVE
Influenza B by PCR: NEGATIVE
Resp Syncytial Virus by PCR: NEGATIVE
SARS Coronavirus 2 by RT PCR: NEGATIVE

## 2024-05-17 LAB — COMPREHENSIVE METABOLIC PANEL WITH GFR
ALT: 10 U/L (ref 0–44)
AST: 17 U/L (ref 15–41)
Albumin: 3.7 g/dL (ref 3.5–5.0)
Alkaline Phosphatase: 105 U/L (ref 38–126)
Anion gap: 7 (ref 5–15)
BUN: 19 mg/dL (ref 8–23)
CO2: 31 mmol/L (ref 22–32)
Calcium: 10 mg/dL (ref 8.9–10.3)
Chloride: 104 mmol/L (ref 98–111)
Creatinine, Ser: 1.35 mg/dL — ABNORMAL HIGH (ref 0.61–1.24)
GFR, Estimated: 57 mL/min — ABNORMAL LOW
Glucose, Bld: 114 mg/dL — ABNORMAL HIGH (ref 70–99)
Potassium: 4.5 mmol/L (ref 3.5–5.1)
Sodium: 142 mmol/L (ref 135–145)
Total Bilirubin: 0.3 mg/dL (ref 0.0–1.2)
Total Protein: 5.6 g/dL — ABNORMAL LOW (ref 6.5–8.1)

## 2024-05-17 LAB — PRO BRAIN NATRIURETIC PEPTIDE: Pro Brain Natriuretic Peptide: 1048 pg/mL — ABNORMAL HIGH

## 2024-05-17 MED ORDER — PREDNISONE 10 MG PO TABS: 40.0000 mg | ORAL_TABLET | Freq: Every day | ORAL | 0 refills | Status: AC

## 2024-05-17 MED ORDER — IPRATROPIUM-ALBUTEROL 0.5-2.5 (3) MG/3ML IN SOLN
3.0000 mL | Freq: Once | RESPIRATORY_TRACT | Status: AC
  Administered 2024-05-17: 3 mL via RESPIRATORY_TRACT
  Filled 2024-05-17: qty 3

## 2024-05-17 MED ORDER — FUROSEMIDE 40 MG PO TABS
20.0000 mg | ORAL_TABLET | Freq: Once | ORAL | Status: AC
  Administered 2024-05-17: 20 mg via ORAL
  Filled 2024-05-17: qty 1

## 2024-05-17 MED ORDER — PREDNISONE 20 MG PO TABS
60.0000 mg | ORAL_TABLET | Freq: Once | ORAL | Status: AC
  Administered 2024-05-17: 60 mg via ORAL
  Filled 2024-05-17: qty 3

## 2024-05-17 MED ORDER — FUROSEMIDE 20 MG PO TABS: 20.0000 mg | ORAL_TABLET | Freq: Every day | ORAL | 0 refills | Status: AC

## 2024-05-17 NOTE — ED Provider Notes (Signed)
 " [place] [facility]ERGENCY DEPARTMENT AT [facility] Provider Note   CSN: [id number] Arrival date & time: 05/17/24  1733     Patient presents with: Cough (Sinus congestion)   Harry Young is a 69 y.o. male.   Patient is a 69 year old male with past medical history of COPD on 3 L home O2, bladder cancer with recent procedure last week not on chemo or radiation, hypertension presenting to the emergency department with cough and shortness of breath.  The patient reports that he started to feel sick yesterday.  He denies any fever.  States that he has had a productive cough as well as runny nose and congestion.  He denies any nausea, vomiting or diarrhea.  He denies any chest pain.  He states he has had swelling in his bilateral feet since his surgery.  He states that he has been using his inhalers at home.  The history is provided by the patient.  Cough      Prior to Admission medications  Medication Sig Start Date End Date Taking? Authorizing Provider  furosemide  (LASIX ) 20 MG tablet Take 1 tablet (20 mg total) by mouth daily. 05/17/24  Yes Ellouise, Sharilynn Cassity K, DO  predniSONE  (DELTASONE ) 10 MG tablet Take 4 tablets (40 mg total) by mouth daily. 05/17/24  Yes Ellouise, Nidal Rivet K, DO  albuterol  (VENTOLIN  HFA) 108 (90 Base) MCG/ACT inhaler Inhale 1-2 puffs into the lungs every 6 (six) hours as needed for shortness of breath or wheezing. 12/13/21   [provider]  amLODipine  (NORVASC ) 2.5 MG tablet Take 2.5 mg by mouth in the morning.    [provider]  Calcium-Magnesium -Zinc (CAL-MAG-ZINC PO) Take 1 tablet by mouth daily with supper. On hold for surgery    [provider]  Cholecalciferol (VITAMIN D3 PO) Take 5,000 Units by mouth daily with supper. On hold for surgery    [provider]  Coenzyme Q10 (COQ10 PO) Take 1 capsule by mouth in the morning. On hold for surgery    [provider]  Dextromethorphan-guaiFENesin  (MUCINEX  FAST-MAX DM MAX)  5-100 MG/5ML LIQD Take 15 mLs by mouth in the morning and at bedtime.    [provider]  ezetimibe (ZETIA) 10 MG tablet Take 10 mg by mouth daily in the afternoon. 06/18/18   [provider]  Fluticasone -Umeclidin-Vilant (TRELEGY ELLIPTA ) 200-62.5-25 MCG/ACT AEPB Inhale 1 puff into the lungs daily. 02/06/24   Kara Dorn NOVAK, MD  Homeopathic Products (RESTFUL LEGS SL) Place 2 tablets under the tongue at bedtime.    [provider]  levothyroxine  (SYNTHROID ) 100 MCG tablet Take 100 mcg by mouth See admin instructions. TAKE 1 TABLET IN AM ON AN EMPTY STOMACH MONDAY-FRIDAY AND SKIP THE WEEKENDS ONCE A DAY BY MOUTH    [provider]  Omega-3 Fatty Acids (FISH OIL PO) Take 1 capsule by mouth in the morning.    [provider]  oxyCODONE  (ROXICODONE ) 5 MG immediate release tablet Take 1-2 tablets (5-10 mg total) by mouth every 6 (six) hours as needed (cancer pain). 05/14/24 05/14/25  Alvaro Ricardo NOVAK Mickey., MD  senna-docusate (SENOKOT-S) 8.6-50 MG tablet Take 1 tablet by mouth 2 (two) times daily. While taking strong pain meds to prevent constipation. 05/14/24   Alvaro Ricardo NOVAK Mickey., MD  simvastatin (ZOCOR) 10 MG tablet Take 10 mg by mouth once a week. 03/15/22   [provider]    Allergies: Crestor [rosuvastatin] and Sulfa antibiotics    Review of Systems  Respiratory:  Positive for cough.     Updated Vital Signs BP (!) 143/67   Pulse 94   Temp 98.3 F (36.8 C) (Oral)   Resp 16   Ht 5' 10 (1.778 m)   Wt 51.7 kg   SpO2 100%   BMI 16.36 kg/m   Physical Exam Vitals and nursing note reviewed.  Constitutional:      General: He is not in acute distress.    Comments: Cachectic appearing, chronically ill appearing  HENT:     Head: Normocephalic and atraumatic.     Nose: Nose normal.     Mouth/Throat:     Mouth: Mucous membranes are moist.  Eyes:     Extraocular Movements: Extraocular movements intact.     Conjunctiva/sclera:  Conjunctivae normal.  Cardiovascular:     Rate and Rhythm: Normal rate and regular rhythm.     Heart sounds: Normal heart sounds.  Pulmonary:     Effort: Pulmonary effort is normal.     Comments: Trace end expiratory wheeze, coarse breath sounds at the bases Abdominal:     General: Abdomen is flat.     Palpations: Abdomen is soft.     Tenderness: There is no abdominal tenderness.  Musculoskeletal:        General: Normal range of motion.     Cervical back: Normal range of motion.     Right lower leg: Edema (1+ to ankles) present.     Left lower leg: Edema (1+ to ankles) present.  Skin:    General: Skin is warm and dry.  Neurological:     General: No focal deficit present.     Mental Status: He is alert and oriented to person, place, and time.  Psychiatric:        Mood and Affect: Mood normal.        Behavior: Behavior normal.     (all labs ordered are listed, but only abnormal results are displayed) Labs Reviewed  CBC - Abnormal; Notable for the following components:      Result Value   RBC 2.80 (*)    Hemoglobin 7.8 (*)    HCT 26.2 (*)    MCHC 29.8 (*)    RDW 18.1 (*)    All other components within normal limits  PRO BRAIN NATRIURETIC PEPTIDE - Abnormal; Notable for the following components:   Pro Brain Natriuretic Peptide 1,048.0 (*)    All other components within normal limits  COMPREHENSIVE METABOLIC PANEL WITH GFR - Abnormal; Notable for the following components:   Glucose, Bld 114 (*)    Creatinine, Ser 1.35 (*)    Total Protein 5.6 (*)    GFR, Estimated 57 (*)    All other components within normal limits  RESP PANEL BY RT-PCR (RSV, FLU A&B, COVID)  [id number]    EKG: None  Radiology: DG Chest 2 View Result Date: 05/17/2024 EXAM: 2 VIEW(S) XRAY OF THE CHEST 05/17/2024 06:38:00 PM COMPARISON: Chest radiograph 11/22/2023 and CT chest 05/02/2024. CLINICAL HISTORY: SOB. History of chronic shortness of breath. Productive cough. FINDINGS: LUNGS AND PLEURA:  Hyperinflation of the lungs suggesting emphysema. Nodular opacities in both lungs. Most prominent is in the right middle lung measuring 7 mm diameter. Multiple nodules are better demonstrated on the prior chest CT. Suspicious for metastatic disease. No airspace disease or consolidation. No developing consolidation in the lungs. No pleural effusion or pneumothorax. HEART AND MEDIASTINUM: Mediastinal contours appear intact. Heart size is normal. BONES AND SOFT TISSUES: Mild degenerative changes in the spine.  No acute osseous abnormality. IMPRESSION: 1. Hyperinflation of the lungs suggesting emphysema. No airspace disease or consolidation. 2. Nodular opacities in both lungs, most prominent in the right middle lung measuring 7 mm in diameter, suspicious for metastatic disease. Multiple nodules are better demonstrated on the prior chest CT. Electronically signed by: Elsie Gravely MD 05/17/2024 07:15 PM EST RP Workstation: [id number]     Procedures   Medications Ordered in the ED  ipratropium-albuterol  (DUONEB) 0.5-2.5 (3) MG/3ML nebulizer solution 3 mL (3 mLs Nebulization Given 05/17/24 1835)  furosemide  (LASIX ) tablet 20 mg (20 mg Oral Given 05/17/24 2202)  predniSONE  (DELTASONE ) tablet 60 mg (60 mg Oral Given 05/17/24 2202)    Clinical Course as of 05/17/24 2205  Sat May 17, 2024  1854 Anemia at baseline.  [VK]  2002 CXR with emphasematous changes, pulmonary nodules which were seen on previous CT chest. Cr at baseline. Mildly elevated BNP without known CHF history, no cardiomegaly on CXR. Viral swab pending. [VK]  2204 Wheezing resolved on exam. Viral swab negative. Will give short course of lasix  and steroid course for COPD exacerbation. Patient has oncology follow up Monday. He was discharged and given strict return precautions.  [VK]    Clinical Course User Index [VK] Kingsley, Carter Kaman K, DO                                 Medical Decision Making This patient presents to the ED with chief  complaint(s) of SOB, cough with pertinent past medical history of COPD on 3L home O2, bladder cancer, HTN which further complicates the presenting complaint. The complaint involves an extensive differential diagnosis and also carries with it a high risk of complications and morbidity.    The differential diagnosis includes ACS, arrhythmia, anemia, pneumonia, pneumothorax, pulmonary edema, pleural effusion, viral syndrome, COPD exacerbation  Additional history obtained: Additional history obtained from N/A Records reviewed previous admission documents and outpatient oncology records  ED Course and Reassessment: On patient's arrival he is hemodynamically stable in no acute distress, satting well on his home O2.  Does have trace end expiratory wheeze also crackles in the bases.  He was given DuoNeb for possible COPD exacerbation and will additionally have labs, viral swab and chest x-ray performed and will be closely reassessed.  Independent labs interpretation:  The following labs were independently interpreted: mildly elevated BNP, otherwise no acute abnormality  Independent visualization of imaging: - I independently visualized the following imaging with scope of interpretation limited to determining acute life threatening conditions related to emergency care: CXR, which revealed no acute disease  Consultation: - Consulted or discussed management/test interpretation w/ external professional: N/A  Consideration for admission or further workup: Patient has no emergent conditions requiring admission or further work-up at this time and is stable for discharge home with primary care follow-up  Social Determinants of health: n/A    Amount and/or Complexity of Data Reviewed Labs: ordered. Radiology: ordered.  Risk Prescription drug management.       Final diagnoses:  Viral URI with cough  COPD exacerbation (HCC)  Bilateral leg edema    ED Discharge Orders          Ordered     furosemide  (LASIX ) 20 MG tablet  Daily        05/17/24 2203    predniSONE  (DELTASONE ) 10 MG tablet  Daily        05/17/24 2203  Kingsley, Kassi Esteve K, DO 05/17/24 2205  "

## 2024-05-17 NOTE — Discharge Instructions (Signed)
 You were seen in the emergency department for your shortness of breath.  You did have some wheezing consistent with a COPD exacerbation and likely have a viral infection.  You did test negative for COVID, flu and RSV.  I have given you a course of steroids he should complete this as prescribed.  Did have some swelling in your legs but no obvious heart failure.  I have given you a short course of Lasix  to help with the swelling.  You can also use over-the-counter Mucinex  or Afrin nasal spray to help with your congestion.  You should follow-up with your primary doctor and your oncologist as scheduled.  You should return to the emergency department if you develop fevers, worsening shortness of breath, worsening swelling or any other new or concerning symptoms.

## 2024-05-17 NOTE — ED Notes (Signed)
 AVS provided by edp was reviewed with pt. Pt verbalized understanding with no additional questions at this time. Pt to go home with family (son and wife) at bedside. Home oxygen was brought by Son. Pt wheelchair to car.

## 2024-05-17 NOTE — ED Triage Notes (Signed)
 Pt from home via ems, copd pt.  Chronic sob, feels like he is getting a cold, coughing up yellow sputum. On 3 l [place] at home. Spo2 100  Feels weak.   Bladder cancer surgery this past Wednesday.  2 days ago noticed swelling in left foot.  Pitting edema in left foot.  All new. Bp 112/60 Resp 18 29 end co2 Cbg 120 T 99.1 oral 97 hr. Family on the way to hospital.

## 2024-05-19 ENCOUNTER — Inpatient Hospital Stay

## 2024-05-19 VITALS — BP 139/54 | HR 92 | Temp 97.5°F | Resp 18 | Wt 108.1 lb

## 2024-05-19 DIAGNOSIS — C679 Malignant neoplasm of bladder, unspecified: Secondary | ICD-10-CM | POA: Insufficient documentation

## 2024-05-19 DIAGNOSIS — Z7189 Other specified counseling: Secondary | ICD-10-CM | POA: Diagnosis not present

## 2024-05-19 LAB — SURGICAL PATHOLOGY

## 2024-05-19 NOTE — Progress Notes (Signed)
 [facility] OFFICE PROGRESS NOTE  Patient Care Team: Corlis Pagan, NP as PCP - General  Harry Young is a 69 y.o.male with history of chronic respiratory failure, COPD and lung nodules, Ta NMIBC, Bilateral carotid artery stenosis, CAD, HTN, HLD, CKD 3, back surgery with chronic back pain [facility].  He also was found to have recurrent large volume bladder tumor that is unresectable.  Report of essentially entire bladder is affected.  Pathology showed high-grade noninvasive papillary UC muscularis propria report not involved.  He had repeat CT scan for pulmonary nodules reported many of which are increasing in size and example right upper lobe 15 x 10 mm from 8 x 6 mm.  CT also showed lobulated heterogeneous enhancing mass throughout the bladder.  Son, wife are all in the room today.  Clinically he most likely has metastatic bladder cancer.  I explained to them the enlarging pulmonary nodules in the setting of diffuse & extensive high-grade urothelial carcinoma likely represent metastatic prostate cancer to the lungs.  Clinically, he does not have good pulmonary function.  He has chronic respiratory failure, on oxygen more recently with weight loss, wheelchair and activity limited.  He has acute respiratory infection.  We discussed goals of care.  We discussed metastatic bladder cancer is not considered curable but treatable.  We discussed potential side effects of EV and pembrolizumab.  Discussed efficacies.  With limited performance status, potential side effects, complication can occur and more concerning if resulting in hospitalization, ED visit, ICU stay potentially.  We discussed with limited PS, hospitalization, and potential ICU admission can occur due to complications from chemoimmunotherapy.  In those cases, patient may lose quality of life, potentially died of complications.  Alternatively, palliative care and hospice is reasonable.  Palliative care consult and hospice is recommended.  If he has improvement of performance status while in hospice, he can always return to see us . Assessment & Plan Urothelial carcinoma of bladder [facility]) Referral to palliative care and hospice Goals of care, counseling/discussion Discussed today. Referral to palliative care and hospice  Orders Placed This Encounter  Procedures   Amb Referral to Palliative Care    Referral Priority:   Routine    Referral Type:   Consultation    Number of Visits Requested:   1     Harry JAYSON Chihuahua, MD  INTERVAL HISTORY: Patient returns for follow-up.  Patient underwent TURBT.  Finding of recurrent massive volume bladder tumor per urology.  Estimated surface area at least 12 cm greater. He is on wheelchair and 3 L oxygen. Limited ability to walk and short of breath easily. Recently acquired viral like illness. He needed transfusion due to hematuria.  PHYSICAL EXAMINATION: ECOG PERFORMANCE STATUS: 3  Vitals:   05/19/24 1559  BP: (!) 139/54  Pulse: 92  Resp: 18  Temp: (!) 97.5 F (36.4 C)  SpO2: 98%   Filed Weights   05/19/24 1559  Weight: 108 lb 1.6 oz (49 kg)   GENERAL: alert, no distress, on wheelchair SKIN: skin color pale, no jaundice  LUNGS: decrease air entry in general; on oxygen with normal breathing effort HEART: regular rate & rhythm    Relevant data reviewed during this visit included pathology, labs and imaging.

## 2024-05-19 NOTE — Op Note (Signed)
 NAME: Harry Young, Harry Young. MEDICAL RECORD NO: [id number] ACCOUNT NO: [id number] DATE OF BIRTH: [birth year] FACILITY: [facility] LOCATION: [facility] PHYSICIAN: Ricardo Likens, MD  Operative Report   DATE OF PROCEDURE: 05/14/2024  PREOPERATIVE DIAGNOSES:  Recurrent massive volume bladder cancer, anemia and failure to thrive.  PROCEDURE PERFORMED: 1.  Transurethral resection of bladder tumor volume large. 2.  Clot evacuation.  ESTIMATED BLOOD LOSS:  Approximately 250 mL of old formed clot, minimal new bleeding.  TRANSFUSION:  2 units given perioperatively given his significant anemia.  DRAIN:  None.  SPECIMENS:  Bladder tumor for permanent pathology.  FINDINGS:   1.  Massive volume bladder cancer, greater than 12 cm squared, not endoscopically curative. 2.  Maximal debulking of all area of papillary tumor.  Estimated total tumor resected 8 cm squared. 3.  No active bleeding from any areas of the bladder following transurethral resection and fulguration and clot evacuation.  INDICATIONS:  The patient is an increasingly comorbid 69 year old man with a longstanding history of multifocal urothelial carcinoma.  He is status post several bladder tumor resections as well as a nephroureterectomy previously as well as BCG therapy.   He has refused cystectomy for years.  He was found most recently on surveillance cystoscopy and evaluation for gross hematuria to have multifocal recurrence of bladder tumor.  He was initially scheduled for transurethral resection months ago. However, he  has had also significant pulmonary decline from his baseline to poor pulmonary function, has been in and out of the hospital related to that.  Therefore, his transurethral resection has been delayed until now.  He does have significant symptomatic  anemia now from his bladder tumor.  Most recent hemoglobin before today 8 but today actually in the upper 4s.  Options discussed with the patient, family and anesthesia staff and they  all agreed on proceeding with transurethral resection with the goal of  maximal local control with perioperative transfusion.  He received 2 units of blood perioperatively.  Notably despite his anemia, he was not tachycardic or hypotensive or even orthostatic pre-transfusion given the chronicity of the process.  After he  received 2 units, we proceeded.  Informed consent was obtained and placed in the medical record.  PROCEDURE IN DETAIL: The patient being verified, procedure being transurethral resection of bladder tumor with 3 was confirmed.  Procedure timeout was performed.  Intravenous antibiotics administered.  General LMA anesthesia was induced.  The patient  was placed into a low lithotomy position, sterile field was created, prepping and draping the patient's penis, perineum and proximal thighs using iodine.  Cystourethroscopy was performed using a 21-French rigid cystoscope with offset lens.  Inspection of  the anterior and posterior urethra was remarkable.  Inspection of the urinary bladder revealed massive amount of formed clot within the urinary bladder, essentially obscuring all normal structures.  As such, the cystoscope was exchanged for a 26-French  resectoscope sheath with visual obturator.  Using a Toomey syringe, approximately 250 mL of old clot was irrigated out, set aside for discard.  Following this, there was much better visualization of the bladder which revealed massive volume recurrent  bladder cancer papillary type essentially in all quadrants, most dominant focus at the dome.  Estimated surface area at least 12 cm squared.  Most of the area of bleeding was more towards the bladder neck.  Next, resectoscope loop was used to very  carefully resect all accessible and safe foci of bladder tumor, sparing the area of approximately 4 cm squared in the bladder dome.  The bladder is quite thin.  This generated innumerable bladder tumor fragments which were irrigated, set aside for   permanent pathology.  Very careful fulguration was performed of all foci of bladder tumor, resulted in excellent hemostasis.  Bladder was very carefully inspected, no evidence of perforation was noted.  Given the operative findings and his functional  decline, I will recommend hospice transition and no further surgical intervention as this would essentially be futile.  I do feel the patient adequate for discharge today as long as his hemoglobin is trending up in PACU labs and is able to void.   SHW Young: 05/14/2024 4:59:48 pm T: 05/15/2024 12:04:00 am  JOB: [id number]/ [id number]

## 2024-05-20 DIAGNOSIS — C679 Malignant neoplasm of bladder, unspecified: Secondary | ICD-10-CM

## 2024-05-20 DIAGNOSIS — D539 Nutritional anemia, unspecified: Secondary | ICD-10-CM

## 2024-05-20 DIAGNOSIS — Z7189 Other specified counseling: Secondary | ICD-10-CM | POA: Insufficient documentation

## 2024-05-20 DIAGNOSIS — C642 Malignant neoplasm of left kidney, except renal pelvis: Secondary | ICD-10-CM

## 2024-05-20 NOTE — Assessment & Plan Note (Addendum)
 Discussed today. Referral to palliative care and hospice

## 2024-05-20 NOTE — Assessment & Plan Note (Addendum)
Referral to palliative care and hospice.

## 2024-05-30 ENCOUNTER — Ambulatory Visit: Payer: Self-pay | Admitting: Emergency Medicine

## 2024-05-30 ENCOUNTER — Telehealth: Payer: Self-pay | Admitting: Nurse Practitioner

## 2024-05-30 NOTE — Telephone Encounter (Signed)
 Spoke with patient Harry Young,   He is under hospice care now.  -NFN

## 2024-06-16 ENCOUNTER — Inpatient Hospital Stay

## 2024-07-30 ENCOUNTER — Ambulatory Visit: Admitting: Pulmonary Disease
